# Patient Record
Sex: Male | Born: 1975 | Race: White | Hispanic: No | Smoking: Never smoker
Health system: Southern US, Community
[De-identification: ages and names within clinical notes are randomized; demographics above are authoritative.]

## PROBLEM LIST (undated history)

## (undated) DIAGNOSIS — E039 Hypothyroidism, unspecified: Secondary | ICD-10-CM

## (undated) DIAGNOSIS — J45909 Unspecified asthma, uncomplicated: Secondary | ICD-10-CM

## (undated) DIAGNOSIS — K7010 Alcoholic hepatitis without ascites: Secondary | ICD-10-CM

## (undated) HISTORY — PX: NO PAST SURGERIES: SHX2092

## (undated) HISTORY — DX: Hypothyroidism, unspecified: E03.9

## (undated) HISTORY — DX: Alcoholic hepatitis without ascites: K70.10

## (undated) HISTORY — DX: Unspecified asthma, uncomplicated: J45.909

---

## 2005-03-20 ENCOUNTER — Emergency Department (HOSPITAL_COMMUNITY): Admission: EM | Admit: 2005-03-20 | Discharge: 2005-03-20 | Payer: Self-pay | Admitting: Emergency Medicine

## 2008-06-17 ENCOUNTER — Encounter: Admission: RE | Admit: 2008-06-17 | Discharge: 2008-06-17 | Payer: Self-pay | Admitting: Internal Medicine

## 2008-06-27 ENCOUNTER — Encounter: Admission: RE | Admit: 2008-06-27 | Discharge: 2008-06-27 | Payer: Self-pay | Admitting: Orthopaedic Surgery

## 2008-07-09 ENCOUNTER — Encounter: Admission: RE | Admit: 2008-07-09 | Discharge: 2008-07-09 | Payer: Self-pay | Admitting: Orthopaedic Surgery

## 2010-02-01 ENCOUNTER — Encounter: Payer: Self-pay | Admitting: Orthopaedic Surgery

## 2016-06-09 ENCOUNTER — Encounter: Payer: Self-pay | Admitting: Physician Assistant

## 2016-06-09 ENCOUNTER — Ambulatory Visit (INDEPENDENT_AMBULATORY_CARE_PROVIDER_SITE_OTHER): Payer: Managed Care, Other (non HMO) | Admitting: Physician Assistant

## 2016-06-09 ENCOUNTER — Ambulatory Visit (INDEPENDENT_AMBULATORY_CARE_PROVIDER_SITE_OTHER): Payer: Managed Care, Other (non HMO)

## 2016-06-09 VITALS — BP 142/90 | HR 92 | Temp 98.7°F | Resp 18 | Wt 188.4 lb

## 2016-06-09 DIAGNOSIS — M25571 Pain in right ankle and joints of right foot: Secondary | ICD-10-CM

## 2016-06-09 MED ORDER — NAPROXEN 500 MG PO TABS: 500.0000 mg | ORAL_TABLET | Freq: Two times a day (BID) | ORAL | 0 refills | Status: DC

## 2016-06-09 NOTE — Patient Instructions (Addendum)
Your xray today does not show a fracture, which is great. This is likely a sprain. I would like you to wear ankle brace daily for the next 3-5 days. You should also rest, ice (4-5 x a day for 20 minutes at a time), and elevate your ankle while at home. I recommend taking naproxen twice daily at least for the next 3-5 days or until swelling and pain resolve. If your pain and swelling are still present in 7-10 days, please return. If symptoms worsen, seek care sooner. Thank you for letting me participate in your health and well being.  Ankle Sprain, Phase I Rehab Ask your health care provider which exercises are safe for you. Do exercises exactly as told by your health care provider and adjust them as directed. It is normal to feel mild stretching, pulling, tightness, or discomfort as you do these exercises, but you should stop right away if you feel sudden pain or your pain gets worse.Do not begin these exercises until told by your health care provider. Stretching and range of motion exercises These exercises warm up your muscles and joints and improve the movement and flexibility of your lower leg and ankle. These exercises also help to relieve pain and stiffness. Exercise A: Gastroc and soleus stretch   1. Sit on the floor with your left / right leg extended. 2. Loop a belt or towel around the ball of your left / right foot. The ball of your foot is on the walking surface, right under your toes. 3. Keep your left / right ankle and foot relaxed and keep your knee straight while you use the belt or towel to pull your foot toward you. You should feel a gentle stretch behind your calf or knee. 4. Hold this position for __________ seconds, then release to the starting position. Repeat the exercise with your knee bent. You can put a pillow or a rolled bath towel under your knee to support it. You should feel a stretch deep in your calf or at your Achilles tendon. Repeat each stretch __________ times.  Complete these stretches __________ times a day. Exercise B: Ankle alphabet   1. Sit with your left / right leg supported at the lower leg.  Do not rest your foot on anything.  Make sure your foot has room to move freely. 2. Think of your left / right foot as a paintbrush, and move your foot to trace each letter of the alphabet in the air. Keep your hip and knee still while you trace. Make the letters as large as you can without feeling discomfort. 3. Trace every letter from A to Z. Repeat __________ times. Complete this exercise __________ times a day. Strengthening exercises These exercises build strength and endurance in your ankle and lower leg. Endurance is the ability to use your muscles for a long time, even after they get tired. Exercise C: Dorsiflexors   1. Secure a rubber exercise band or tube to an object, such as a table leg, that will stay still when the band is pulled. Secure the other end around your left / right foot. 2. Sit on the floor facing the object, with your left / right leg extended. The band or tube should be slightly tense when your foot is relaxed. 3. Slowly bring your foot toward you, pulling the band tighter. 4. Hold this position for __________ seconds. 5. Slowly return your foot to the starting position. Repeat __________ times. Complete this exercise __________ times a day. Exercise  D: Plantar flexors   1. Sit on the floor with your left / right leg extended. 2. Loop a rubber exercise tube or band around the ball of your left / right foot. The ball of your foot is on the walking surface, right under your toes.  Hold the ends of the band or tube in your hands.  The band or tube should be slightly tense when your foot is relaxed. 3. Slowly point your foot and toes downward, pushing them away from you. 4. Hold this position for __________ seconds. 5. Slowly return your foot to the starting position. Repeat __________ times. Complete this exercise  __________ times a day. Exercise E: Evertors  1. Sit on the floor with your legs straight out in front of you. 2. Loop a rubber exercise band or tube around the ball of your left / right foot. The ball of your foot is on the walking surface, right under your toes.  Hold the ends of the band in your hands, or secure the band to a stable object.  The band or tube should be slightly tense when your foot is relaxed. 3. Slowly push your foot outward, away from your other leg. 4. Hold this position for __________ seconds. 5. Slowly return your foot to the starting position. Repeat __________ times. Complete this exercise __________ times a day. This information is not intended to replace advice given to you by your health care provider. Make sure you discuss any questions you have with your health care provider. Document Released: 07/29/2004 Document Revised: 09/04/2015 Document Reviewed: 11/11/2014 Elsevier Interactive Patient Education  2017 [employer]    IF you received an x-ray today, you will receive an invoice from [facility]. Please contact [facility] at [phone] with questions or concerns regarding your invoice.   IF you received labwork today, you will receive an invoice from [facility]. Please contact [facility] at [phone] with questions or concerns regarding your invoice.   Our billing staff will not be able to assist you with questions regarding bills from these companies.  You will be contacted with the lab results as soon as they are available. The fastest way to get your results is to activate your My Chart account. Instructions are located on the last page of this paperwork. If you have not heard from us regarding the results in 2 weeks, please contact this office.

## 2016-06-09 NOTE — Progress Notes (Signed)
Danny West [place]rington  MRN: [id number] DOB: 12/19/1975  Subjective:  Danny West Mulcahey is a 41 y.o. male seen in office today for a chief complaint of right ankle pain x 3 days. He kicked wood with his right foot 3 days ago and noticed immediate pain. Has associated swelling and limited ROM. Endorses intermittent numbness. He can bear weight but it hurts to do so. Denies tingling and bruising. Has tried elevation, ice, and ibuprofen with mild relief. Has hx of multiple sprains in bilateral ankles.  Review of Systems  Constitutional: Negative for chills, diaphoresis and fever.  Gastrointestinal: Negative for nausea and vomiting.    There are no active problems to display for this patient.   No current outpatient prescriptions on file prior to visit.   No current facility-administered medications on file prior to visit.     No Known Allergies     Social History   Social History  . Marital status: Married    Spouse name: N/A  . Number of children: N/A  . Years of education: N/A   Occupational History  . Not on file.   Social History Main Topics  . Smoking status: Never Smoker  . Smokeless tobacco: Never Used  . Alcohol use Yes  . Drug use: Unknown  . Sexual activity: Not on file   Other Topics Concern  . Not on file   Social History Narrative  . No narrative on file    Objective:  BP (!) 142/90   Pulse 92   Temp 98.7 F (37.1 C) (Oral)   Resp 18   Wt 188 lb 6.4 oz (85.5 kg)   SpO2 98%   Physical Exam  Constitutional: He is oriented to person, place, and time and well-developed, well-nourished, and in no distress.  HENT:  Head: Normocephalic and atraumatic.  Eyes: Conjunctivae are normal.  Neck: Normal range of motion.  Pulmonary/Chest: Effort normal.  Musculoskeletal:       Right ankle: He exhibits decreased range of motion and swelling ( over medial malleolus and medial ligament). He exhibits no ecchymosis and normal pulse. Tenderness. Medial malleolus tenderness  found. No lateral malleolus, no AITFL, no head of 5th metatarsal and no proximal fibula tenderness found. Achilles tendon normal.  Cap refill <2 sec in right foot.   Neurological: He is alert and oriented to person, place, and time. Gait normal.  Skin: Skin is warm and dry.  Psychiatric: Affect normal.  Vitals reviewed.  No results found for this or any previous visit (from the past 24 hour(s)).  Dg Ankle Complete Right  Result Date: 06/09/2016 CLINICAL DATA:  Right ankle pain and swelling for 3 days medially after kicking a piece of wood. Initial encounter. EXAM: RIGHT ANKLE - COMPLETE 3+ VIEW COMPARISON:  None. FINDINGS: No acute fracture or dislocation is identified. There is an ankle joint effusion. There is spurring at the tip of the medial malleolus and adjacent talus. Bone mineralization appears normal. IMPRESSION: 1. Ankle joint effusion without acute osseous abnormality identified. 2. Medial tibiotalar spurring. Electronically Signed   By: Sebastian AcheAllen  Grady M.D.   On: 06/09/2016 09:58     Assessment and Plan :  1. Acute right ankle pain Plain films do not suggest acute fx or dislocation. Likely ankle sprain. Will treat with P.R.I.C.E principles. Recommended to return to clinic if symptoms worsen, do not improve in 7-10 days, or as needed - DG Ankle Complete Right; Future - Apply ASO ankle - naproxen (NAPROSYN) 500 MG tablet; Take 1 tablet (  500 mg total) by mouth 2 (two) times daily with a meal.  Dispense: 30 tablet; Refill: 0   Benjiman Core, PA-C  [facility] 06/09/2016 1:54 PM

## 2017-11-22 DIAGNOSIS — F1010 Alcohol abuse, uncomplicated: Secondary | ICD-10-CM | POA: Insufficient documentation

## 2017-11-22 DIAGNOSIS — F3341 Major depressive disorder, recurrent, in partial remission: Secondary | ICD-10-CM | POA: Insufficient documentation

## 2017-11-22 DIAGNOSIS — F1091 Alcohol use, unspecified, in remission: Secondary | ICD-10-CM | POA: Insufficient documentation

## 2018-12-14 IMAGING — DX DG ANKLE COMPLETE 3+V*R*
4 series · 4 of 4 positions shown · non-contrast
Comparison: None.

CLINICAL DATA: Right ankle pain and swelling for 3 days medially
after kicking a piece of Juela. Initial encounter.

EXAM:
RIGHT ANKLE - COMPLETE 3+ VIEW

[ankle ap]
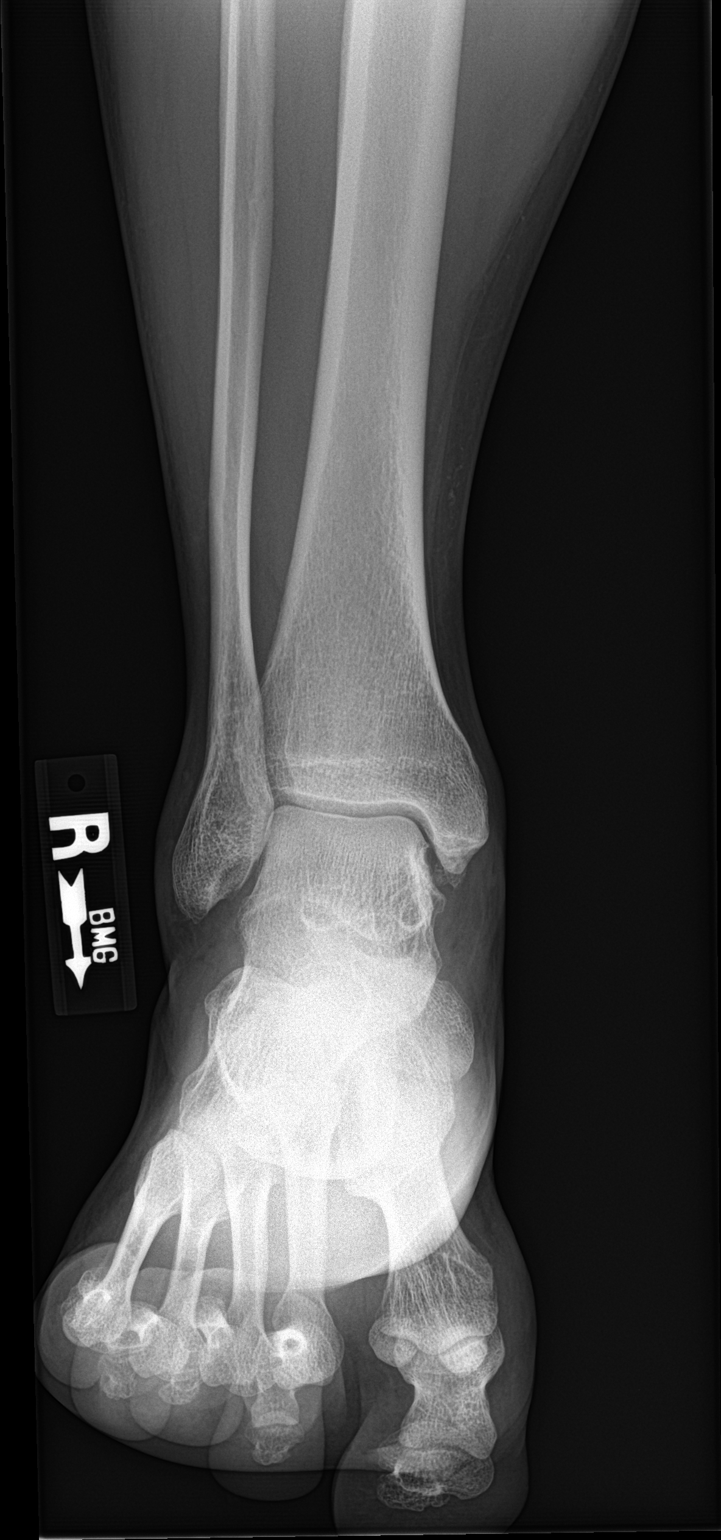

[ankle obl (1 of 2)]
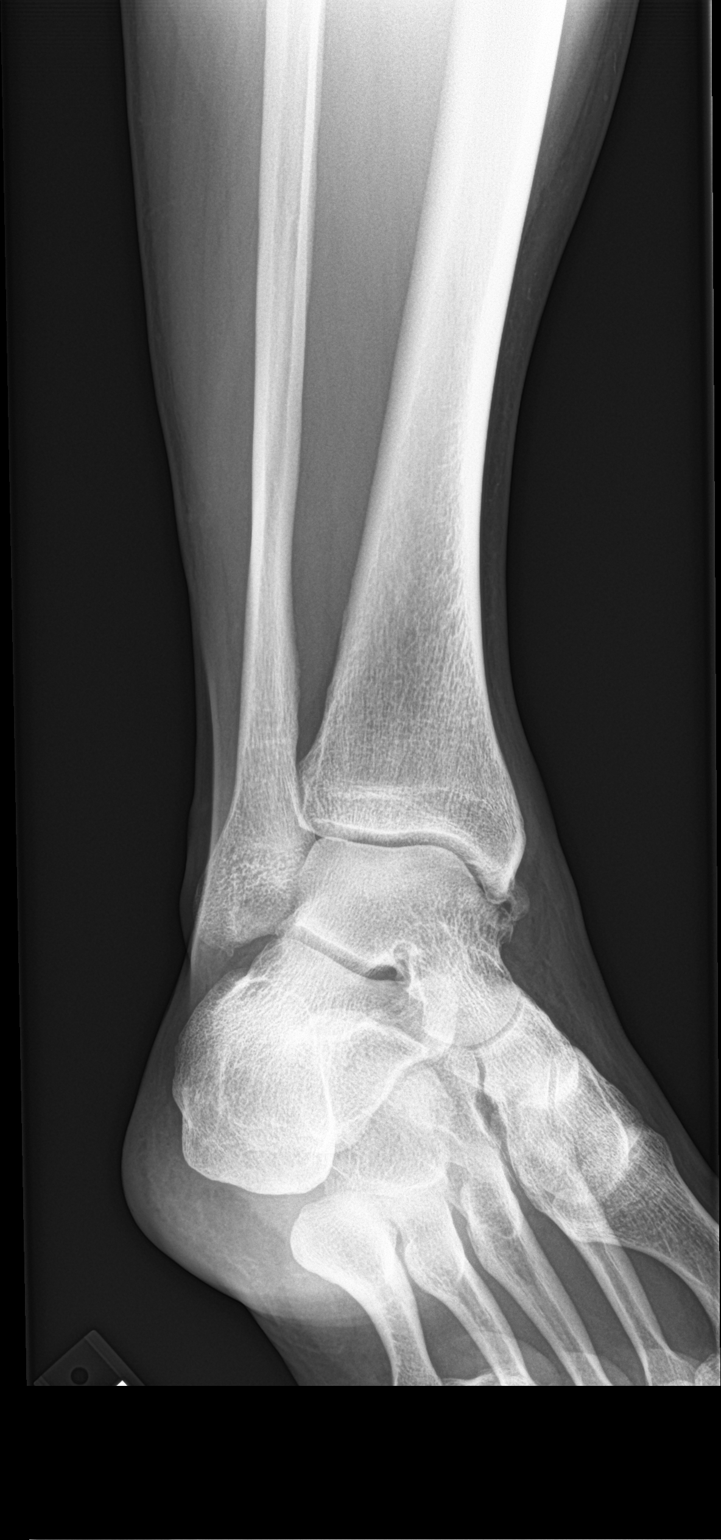

[ankle lat]
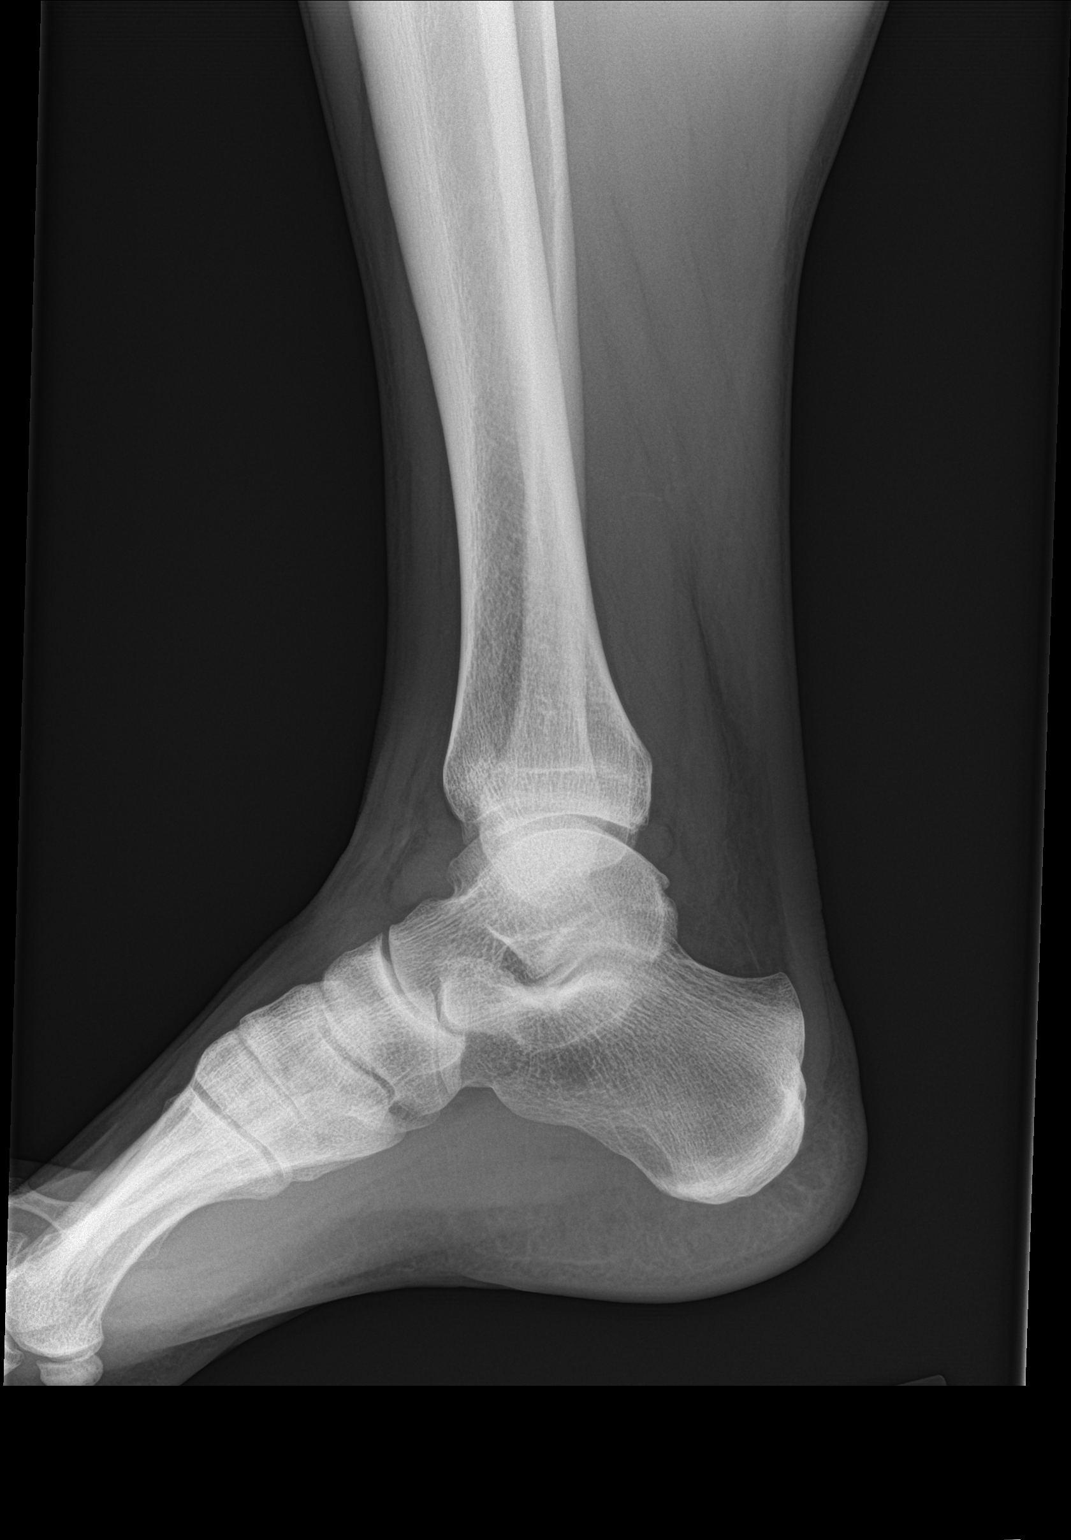

[ankle obl (2 of 2)]
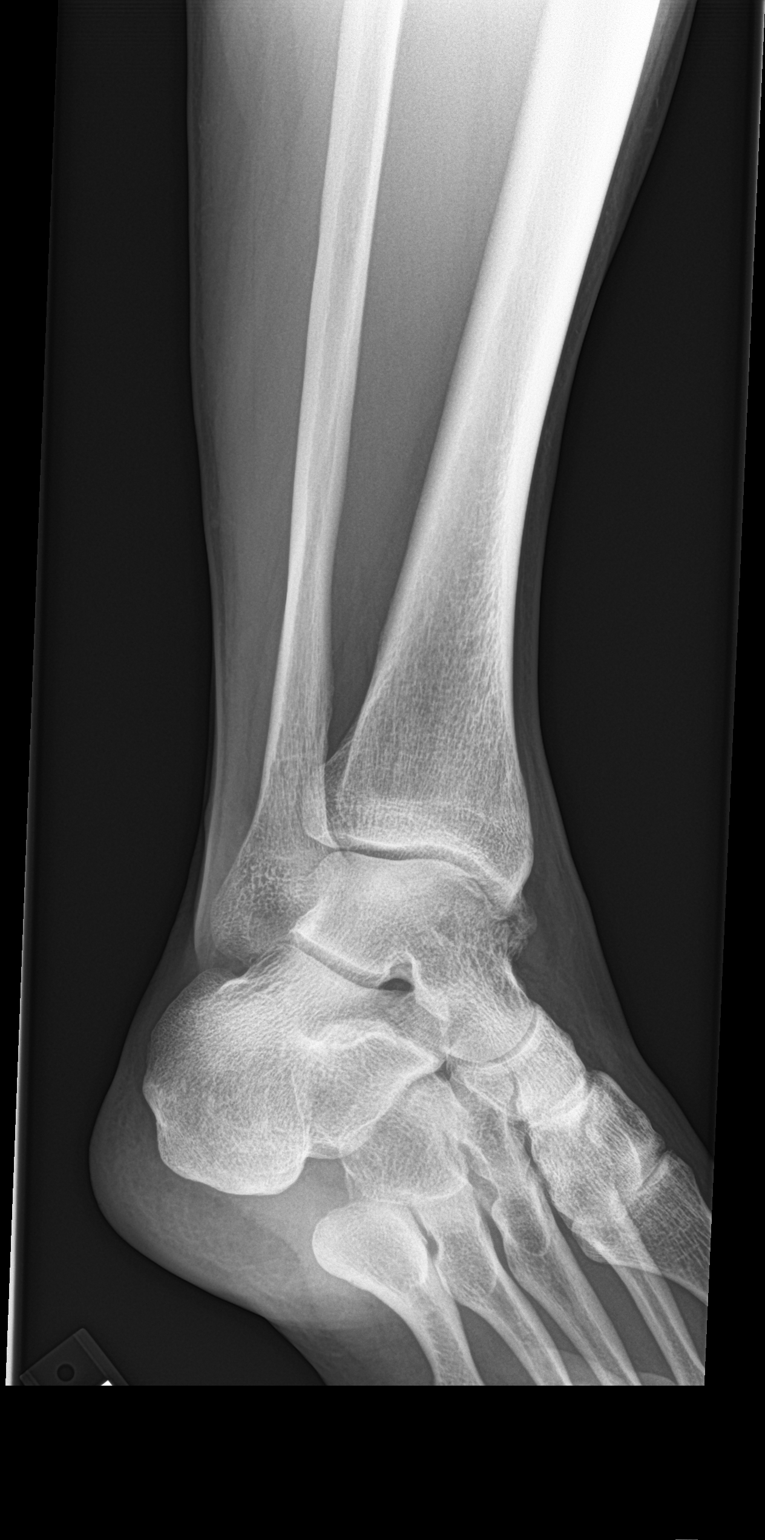

[4 of 4 positions shown; findings below may reference images not displayed]

FINDINGS: No acute fracture or dislocation is identified. There is an ankle
joint effusion. There is spurring at the tip of the medial malleolus
and adjacent talus. Bone mineralization appears normal.
IMPRESSION: 1. Ankle joint effusion without acute osseous abnormality
identified.
2. Medial tibiotalar spurring.

## 2020-10-02 ENCOUNTER — Emergency Department (HOSPITAL_BASED_OUTPATIENT_CLINIC_OR_DEPARTMENT_OTHER): Payer: Self-pay

## 2020-10-02 ENCOUNTER — Emergency Department (HOSPITAL_BASED_OUTPATIENT_CLINIC_OR_DEPARTMENT_OTHER)
Admission: EM | Admit: 2020-10-02 | Discharge: 2020-10-02 | Disposition: A | Discharge status: Home / Self Care | Payer: Self-pay | Attending: Emergency Medicine | Admitting: Emergency Medicine

## 2020-10-02 ENCOUNTER — Other Ambulatory Visit: Payer: Self-pay

## 2020-10-02 ENCOUNTER — Encounter (HOSPITAL_BASED_OUTPATIENT_CLINIC_OR_DEPARTMENT_OTHER): Payer: Self-pay

## 2020-10-02 DIAGNOSIS — J45909 Unspecified asthma, uncomplicated: Secondary | ICD-10-CM | POA: Insufficient documentation

## 2020-10-02 DIAGNOSIS — G4700 Insomnia, unspecified: Secondary | ICD-10-CM | POA: Insufficient documentation

## 2020-10-02 DIAGNOSIS — R42 Dizziness and giddiness: Secondary | ICD-10-CM | POA: Insufficient documentation

## 2020-10-02 DIAGNOSIS — F419 Anxiety disorder, unspecified: Secondary | ICD-10-CM | POA: Insufficient documentation

## 2020-10-02 LAB — CBG MONITORING, ED: Glucose-Capillary: 113 mg/dL — ABNORMAL HIGH (ref 70–99)

## 2020-10-02 LAB — CBC WITH DIFFERENTIAL/PLATELET
Abs Immature Granulocytes: 0.01 10*3/uL (ref 0.00–0.07)
Basophils Absolute: 0.1 10*3/uL (ref 0.0–0.1)
Basophils Relative: 1 %
Eosinophils Absolute: 0 10*3/uL (ref 0.0–0.5)
Eosinophils Relative: 1 %
HCT: 40.7 % (ref 39.0–52.0)
Hemoglobin: 14.6 g/dL (ref 13.0–17.0)
Immature Granulocytes: 0 %
Lymphocytes Relative: 26 %
Lymphs Abs: 1.9 10*3/uL (ref 0.7–4.0)
MCH: 34 pg (ref 26.0–34.0)
MCHC: 35.9 g/dL (ref 30.0–36.0)
MCV: 94.9 fL (ref 80.0–100.0)
Monocytes Absolute: 0.7 10*3/uL (ref 0.1–1.0)
Monocytes Relative: 9 %
Neutro Abs: 4.7 10*3/uL (ref 1.7–7.7)
Neutrophils Relative %: 63 %
Platelets: 125 10*3/uL — ABNORMAL LOW (ref 150–400)
RBC: 4.29 MIL/uL (ref 4.22–5.81)
RDW: 12 % (ref 11.5–15.5)
WBC: 7.5 10*3/uL (ref 4.0–10.5)
nRBC: 0 % (ref 0.0–0.2)

## 2020-10-02 LAB — BASIC METABOLIC PANEL
Anion gap: 12 (ref 5–15)
BUN: 10 mg/dL (ref 6–20)
CO2: 26 mmol/L (ref 22–32)
Calcium: 9.5 mg/dL (ref 8.9–10.3)
Chloride: 98 mmol/L (ref 98–111)
Creatinine, Ser: 0.92 mg/dL (ref 0.61–1.24)
GFR, Estimated: >60 mL/min (ref >60)
Glucose, Bld: 104 mg/dL — ABNORMAL HIGH (ref 70–99)
Potassium: 3.8 mmol/L (ref 3.5–5.1)
Sodium: 136 mmol/L (ref 135–145)

## 2020-10-02 LAB — TROPONIN I (HIGH SENSITIVITY): Troponin I (High Sensitivity): 3 ng/L (ref <18)

## 2020-10-02 MED ORDER — SODIUM CHLORIDE 0.9 % IV BOLUS
1000.0000 mL | Freq: Once | INTRAVENOUS | Status: AC
  Administered 2020-10-02: 1000 mL via INTRAVENOUS

## 2020-10-02 NOTE — ED Provider Notes (Signed)
[facility] EMERGENCY DEPT Provider Note   CSN: [id number] Arrival date & time: 10/02/20  0147     History Chief Complaint  Patient presents with   Dizziness   Anxiety    Danny West is a 45 y.o. male.  The history is provided by the patient.  Dizziness Quality:  Lightheadedness Severity:  Moderate Onset quality:  Gradual Duration: weeks. Timing:  Constant Progression:  Unchanged Chronicity:  New Context: not when standing up and not when urinating   Context comment:  Stress and anxiety related to custody issues. Relieved by:  Nothing Worsened by:  Nothing Ineffective treatments:  None tried Associated symptoms: no blood in stool, no chest pain, no diarrhea, no headaches, no hearing loss, no nausea, no shortness of breath, no syncope, no tinnitus, no vision changes and no vomiting   Risk factors: no hx of vertigo   Anxiety Pertinent negatives include no chest pain, no headaches and no shortness of breath.  Patient presents with anxiety, stress and insomnia related to stress.  States he is seeing a therapist but needs someone to prescribe medications for him. No CP, No SOB.  No vomiting.  No f/c/r.  No SI no HI.  His appointment with the counselor is today.     Past Medical History:  Diagnosis Date   Asthma     There are no problems to display for this patient.   History reviewed. No pertinent surgical history.     History reviewed. No pertinent family history.  Social History   Tobacco Use   Smoking status: Never   Smokeless tobacco: Never  Substance Use Topics   Alcohol use: Yes    Comment: Occasionally   Drug use: Never    Home Medications Prior to Admission medications   Medication Sig Start Date End Date Taking? Authorizing Provider  FLUoxetine (PROZAC) 40 MG capsule Take 40 mg by mouth daily.    [provider]  levothyroxine (SYNTHROID, LEVOTHROID) 175 MCG tablet Take 175 mcg by mouth daily before breakfast.    [provider]  lisdexamfetamine (VYVANSE) 40 MG capsule Take 40 mg by mouth every morning.    [provider]  naproxen (NAPROSYN) 500 MG tablet Take 1 tablet (500 mg total) by mouth 2 (two) times daily with a meal. 06/09/16   Benjiman Core D, PA-C    Allergies    Patient has no known allergies.  Review of Systems   Review of Systems  Constitutional:  Negative for fever.  HENT:  Negative for hearing loss and tinnitus.   Respiratory:  Negative for shortness of breath, wheezing and stridor.   Cardiovascular:  Negative for chest pain and syncope.  Gastrointestinal:  Negative for blood in stool, diarrhea, nausea and vomiting.  Genitourinary:  Negative for difficulty urinating.  Skin:  Negative for rash.  Neurological:  Positive for light-headedness. Negative for headaches.  Psychiatric/Behavioral:  Positive for sleep disturbance. Negative for agitation, confusion and suicidal ideas. The patient is not hyperactive.   All other systems reviewed and are negative.  Physical Exam Updated Vital Signs BP (!) 131/96   Pulse 80   Temp 98 F (36.7 C) (Oral)   Resp 16   Ht 5\' 7"  (1.702 m)   Wt 85.5 kg   SpO2 98%   BMI 29.52 kg/m   Physical Exam Vitals and nursing note reviewed.  Constitutional:      General: He is not in acute distress.    Appearance: Normal appearance.  HENT:  Head: Normocephalic and atraumatic.     Nose: Nose normal.  Eyes:     Conjunctiva/sclera: Conjunctivae normal.     Pupils: Pupils are equal, round, and reactive to light.  Cardiovascular:     Rate and Rhythm: Normal rate and regular rhythm.     Pulses: Normal pulses.     Heart sounds: Normal heart sounds.  Pulmonary:     Effort: Pulmonary effort is normal.     Breath sounds: Normal breath sounds.  Abdominal:     General: Abdomen is flat. Bowel sounds are normal.     Palpations: Abdomen is soft.     Tenderness: There is no abdominal tenderness. There is no guarding or rebound.   Musculoskeletal:        General: Normal range of motion.     Cervical back: Normal range of motion and neck supple.  Skin:    General: Skin is warm and dry.     Capillary Refill: Capillary refill takes less than 2 seconds.  Neurological:     General: No focal deficit present.     Mental Status: He is alert and oriented to person, place, and time.     Deep Tendon Reflexes: Reflexes normal.  Psychiatric:        Mood and Affect: Mood normal.        Behavior: Behavior normal.    ED Results / Procedures / Treatments   Labs (all labs ordered are listed, but only abnormal results are displayed) Labs Reviewed  CBC WITH DIFFERENTIAL/PLATELET - Abnormal; Notable for the following components:      Result Value   Platelets 125 (*)    All other components within normal limits  BASIC METABOLIC PANEL - Abnormal; Notable for the following components:   Glucose, Bld 104 (*)    All other components within normal limits  CBG MONITORING, ED - Abnormal; Notable for the following components:   Glucose-Capillary 113 (*)    All other components within normal limits  TROPONIN I (HIGH SENSITIVITY)  TROPONIN I (HIGH SENSITIVITY)    EKG None  Radiology DG Chest Portable 1 View  Result Date: 10/02/2020 CLINICAL DATA:  Chest pain. EXAM: PORTABLE CHEST 1 VIEW COMPARISON:  None. FINDINGS: The heart size and mediastinal contours are within normal limits. Both lungs are clear. The visualized skeletal structures are unremarkable. IMPRESSION: No active disease. Electronically Signed   By: Elgie Collard M.D.   On: 10/02/2020 02:38    Procedures Procedures   Medications Ordered in ED Medications  sodium chloride 0.9 % bolus 1,000 mL (1,000 mLs Intravenous New Bag/Given 10/02/20 0327)    ED Course  I have reviewed the triage vital signs and the nursing notes.  Pertinent labs & imaging results that were available during my care of the patient were reviewed by me and considered in my medical decision  making (see chart for details).   Given he stated he has not been hydrating well IVF was ordered. We do not start sleeping medications or SSRIs in the ED.  Follow up with your regular doctor.  Recommend melatonin or unisom for sleep.    Danny West was evaluated in Emergency Department on 10/02/2020 for the symptoms described in the history of present illness. He was evaluated in the context of the global COVID-19 pandemic, which necessitated consideration that the patient might be at risk for infection with the SARS-CoV-2 virus that causes COVID-19. Institutional protocols and algorithms that pertain to the evaluation of patients at risk for COVID-19  are in a state of rapid change based on information released by regulatory bodies including the CDC and federal and state organizations. These policies and algorithms were followed during the patient's care in the ED.]  Final Clinical Impression(s) / ED Diagnoses Final diagnoses:  Anxiety  Insomnia, unspecified type   Return for intractable cough, coughing up blood, fevers > 100.4 unrelieved by medication, shortness of breath, intractable vomiting, chest pain, shortness of breath, weakness, numbness, changes in speech, facial asymmetry, abdominal pain, passing out, Inability to tolerate liquids or food, cough, altered mental status or any concerns. No signs of systemic illness or infection. The patient is nontoxic-appearing on exam and vital signs are within normal limits. I have reviewed the triage vital signs and the nursing notes. Pertinent labs & imaging results that were available during my care of the patient were reviewed by me and considered in my medical decision making (see chart for details). After history, exam, and medical workup I feel the patient has been appropriately medically screened and is safe for discharge home. Pertinent diagnoses were discussed with the patient. Patient was given return precautions.   Rx / DC Orders ED Discharge  Orders     None        Ilian Wessell, MD 10/02/20 [phone]

## 2020-10-02 NOTE — ED Triage Notes (Signed)
Patient here POV from Home with Anxiety and Dizziness.  Patient states he has been under Stress recently due to Personal Issues. Patient has had Difficulty Sleeping, Dizziness, Anxiety, and Loss of Appetite due to Anxiety. Patient has been seeing Therapist recently but does not take medications for Same.   Patient Anxious during Triage. A&Ox4. GCS 15. No SI.

## 2020-10-02 NOTE — Discharge Instructions (Addendum)
Can take melatonin 1 tablet nightly for sleep or one unisom prior to bed

## 2021-01-19 ENCOUNTER — Encounter (HOSPITAL_COMMUNITY): Payer: Self-pay | Admitting: Student

## 2021-01-19 ENCOUNTER — Ambulatory Visit (HOSPITAL_COMMUNITY)
Admission: EM | Admit: 2021-01-19 | Discharge: 2021-01-20 | Disposition: A | Discharge status: Home / Self Care | Payer: No Payment, Other | Attending: Student | Admitting: Student

## 2021-01-19 DIAGNOSIS — F1020 Alcohol dependence, uncomplicated: Secondary | ICD-10-CM | POA: Insufficient documentation

## 2021-01-19 DIAGNOSIS — Z20822 Contact with and (suspected) exposure to covid-19: Secondary | ICD-10-CM | POA: Insufficient documentation

## 2021-01-19 DIAGNOSIS — R21 Rash and other nonspecific skin eruption: Secondary | ICD-10-CM | POA: Insufficient documentation

## 2021-01-19 DIAGNOSIS — R251 Tremor, unspecified: Secondary | ICD-10-CM | POA: Insufficient documentation

## 2021-01-19 DIAGNOSIS — R45851 Suicidal ideations: Secondary | ICD-10-CM | POA: Insufficient documentation

## 2021-01-19 DIAGNOSIS — T43226A Underdosing of selective serotonin reuptake inhibitors, initial encounter: Secondary | ICD-10-CM | POA: Insufficient documentation

## 2021-01-19 DIAGNOSIS — T381X6A Underdosing of thyroid hormones and substitutes, initial encounter: Secondary | ICD-10-CM | POA: Insufficient documentation

## 2021-01-19 DIAGNOSIS — F332 Major depressive disorder, recurrent severe without psychotic features: Secondary | ICD-10-CM | POA: Insufficient documentation

## 2021-01-19 DIAGNOSIS — R112 Nausea with vomiting, unspecified: Secondary | ICD-10-CM | POA: Insufficient documentation

## 2021-01-19 DIAGNOSIS — Z6379 Other stressful life events affecting family and household: Secondary | ICD-10-CM | POA: Insufficient documentation

## 2021-01-19 DIAGNOSIS — F1521 Other stimulant dependence, in remission: Secondary | ICD-10-CM | POA: Insufficient documentation

## 2021-01-19 DIAGNOSIS — R0789 Other chest pain: Secondary | ICD-10-CM | POA: Insufficient documentation

## 2021-01-19 DIAGNOSIS — J45909 Unspecified asthma, uncomplicated: Secondary | ICD-10-CM | POA: Insufficient documentation

## 2021-01-19 DIAGNOSIS — F419 Anxiety disorder, unspecified: Secondary | ICD-10-CM | POA: Insufficient documentation

## 2021-01-19 DIAGNOSIS — Z5989 Other problems related to housing and economic circumstances: Secondary | ICD-10-CM | POA: Insufficient documentation

## 2021-01-19 DIAGNOSIS — F1291 Cannabis use, unspecified, in remission: Secondary | ICD-10-CM | POA: Insufficient documentation

## 2021-01-19 DIAGNOSIS — G4700 Insomnia, unspecified: Secondary | ICD-10-CM | POA: Insufficient documentation

## 2021-01-19 DIAGNOSIS — E039 Hypothyroidism, unspecified: Secondary | ICD-10-CM | POA: Insufficient documentation

## 2021-01-19 DIAGNOSIS — Z720 Tobacco use: Secondary | ICD-10-CM | POA: Insufficient documentation

## 2021-01-19 DIAGNOSIS — Z635 Disruption of family by separation and divorce: Secondary | ICD-10-CM | POA: Insufficient documentation

## 2021-01-19 DIAGNOSIS — Z9152 Personal history of nonsuicidal self-harm: Secondary | ICD-10-CM | POA: Insufficient documentation

## 2021-01-19 DIAGNOSIS — Z91128 Patient's intentional underdosing of medication regimen for other reason: Secondary | ICD-10-CM | POA: Insufficient documentation

## 2021-01-19 LAB — POCT URINE DRUG SCREEN - MANUAL ENTRY (I-SCREEN)
POC Amphetamine UR: NOT DETECTED
POC Buprenorphine (BUP): NOT DETECTED
POC Cocaine UR: NOT DETECTED
POC Marijuana UR: NOT DETECTED
POC Methadone UR: NOT DETECTED
POC Methamphetamine UR: NOT DETECTED
POC Morphine: NOT DETECTED
POC Oxazepam (BZO): NOT DETECTED
POC Oxycodone UR: NOT DETECTED
POC Secobarbital (BAR): NOT DETECTED

## 2021-01-19 LAB — POC SARS CORONAVIRUS 2 AG -  ED: SARS Coronavirus 2 Ag: NEGATIVE

## 2021-01-19 MED ORDER — CEPHALEXIN 250 MG PO CAPS
500.0000 mg | ORAL_CAPSULE | Freq: Four times a day (QID) | ORAL | Status: DC
  Administered 2021-01-20: 500 mg via ORAL
  Filled 2021-01-19: qty 2

## 2021-01-19 MED ORDER — LOPERAMIDE HCL 2 MG PO CAPS: 2.0000 mg | ORAL_CAPSULE | ORAL | Status: DC | PRN

## 2021-01-19 MED ORDER — LEVOTHYROXINE SODIUM 75 MCG PO TABS: 175.0000 ug | ORAL_TABLET | Freq: Every day | ORAL | Status: DC

## 2021-01-19 MED ORDER — HYDROXYZINE HCL 25 MG PO TABS: 25.0000 mg | ORAL_TABLET | Freq: Four times a day (QID) | ORAL | Status: DC | PRN

## 2021-01-19 MED ORDER — ADULT MULTIVITAMIN W/MINERALS CH: 1.0000 | ORAL_TABLET | Freq: Every day | ORAL | Status: DC

## 2021-01-19 MED ORDER — LEVOTHYROXINE SODIUM 100 MCG PO TABS
175.0000 ug | ORAL_TABLET | Freq: Every day | ORAL | Status: DC
Start: 2021-01-20 — End: 2021-01-20

## 2021-01-19 MED ORDER — ACETAMINOPHEN 325 MG PO TABS: 650.0000 mg | ORAL_TABLET | Freq: Four times a day (QID) | ORAL | Status: DC | PRN

## 2021-01-19 MED ORDER — ALBUTEROL SULFATE HFA 108 (90 BASE) MCG/ACT IN AERS: 1.0000 | INHALATION_SPRAY | Freq: Four times a day (QID) | RESPIRATORY_TRACT | Status: DC | PRN

## 2021-01-19 MED ORDER — LORAZEPAM 1 MG PO TABS: 1.0000 mg | ORAL_TABLET | Freq: Four times a day (QID) | ORAL | Status: DC | PRN

## 2021-01-19 MED ORDER — TRAZODONE HCL 50 MG PO TABS: 50.0000 mg | ORAL_TABLET | Freq: Every evening | ORAL | Status: DC | PRN

## 2021-01-19 MED ORDER — SERTRALINE HCL 50 MG PO TABS
50.0000 mg | ORAL_TABLET | Freq: Every day | ORAL | Status: DC
Start: 2021-01-19 — End: 2021-01-20
  Administered 2021-01-19: 50 mg via ORAL
  Filled 2021-01-19: qty 1

## 2021-01-19 MED ORDER — ONDANSETRON 4 MG PO TBDP: 4.0000 mg | ORAL_TABLET | Freq: Four times a day (QID) | ORAL | Status: DC | PRN

## 2021-01-19 MED ORDER — CEPHALEXIN 250 MG PO CAPS: 500.0000 mg | ORAL_CAPSULE | Freq: Four times a day (QID) | ORAL | Status: DC

## 2021-01-19 MED ORDER — MAGNESIUM HYDROXIDE 400 MG/5ML PO SUSP: 30.0000 mL | Freq: Every day | ORAL | Status: DC | PRN

## 2021-01-19 MED ORDER — ALUM & MAG HYDROXIDE-SIMETH 200-200-20 MG/5ML PO SUSP: 30.0000 mL | ORAL | Status: DC | PRN

## 2021-01-19 MED ORDER — THIAMINE HCL 100 MG PO TABS: 100.0000 mg | ORAL_TABLET | Freq: Every day | ORAL | Status: DC

## 2021-01-19 NOTE — ED Provider Notes (Addendum)
Behavioral Health Admission [facility] & OBS)  Date: 01/19/21 Patient Name: Danny West MRN: [id number] Chief Complaint:  Chief Complaint  Patient presents with   Suicidal      Diagnoses:  Final diagnoses:  MDD (major depressive disorder), recurrent severe, without psychosis ([place])  Alcohol use disorder, severe, dependence ([place])  Suicidal ideation    HPI: Danny West is a 46 year old male with past psychiatric history significant for depression, anxiety, insomnia, and alcohol abuse, as well as past medical history significant for asthma and hypothyroidism, who presents to the [facility] behavioral health urgent care [facility]) unaccompanied as a voluntary walk-in for symptoms of worsening depression, worsening anxiety, suicidal ideation with plan, and alcohol abuse.  Patient reports that he believes that he is currently experiencing "another mental breakdown".  Patient states "I'm just tired of fighting and the money and the divorce and the fight with my kids".  Patient endorses active SI on exam with intent and plan to "jump in front of a similar train".  Patient states that the main stressors in his life that are currently contributing to his worsening depression, anxiety, and SI are issues with his ex-wife, not being able to see his children as often as he would like to, and financial stressors.  Patient states that he has been separated from his ex-wife since 09/28/2017, in which patient reports that his ex-wife took his children from him at that time.  Patient states that he is currently only allowed custody of his children for 1 day / 24 hours every other week, which patient reports is unsupervised custody.  Patient reports that he has been hoping to obtain 50-50 shared custody of his children, patient states that he is fearful that he will not be able to obtain this due to his current mental health issues.  Patient also reports that he took his children with him to his sister's home in  [place] for the weekend this past weekend without telling his ex-wife because he did not know when he was going to see his children again and he reports that his ex-wife is upset with him about this.  Patient denies history of any past suicide attempts.  He endorses history of self-injurious behavior via cutting, but he denies any cutting behavior since he was in high school.  He denies history of intentionally burning himself.  He denies homicidal ideations.  He denies AVH.  Patient reports that he has suspicion that the person living in the floor below him in his apartment has possibly installed a meth lab in their apartment unit, but he states that he is not sure if this is actually true.  Patient reports that he has this suspicion due to smells of specific fumes that he has noticed coming from the apartment unit below him.  Patient reports that he may be experiencing paranoia regarding this matter, but he states that he is not entirely sure if he is paranoid about this issue or not because some people have told him that they do notice the same smells he does coming from the apartment below him and some people do not.  He reports his sleep is poor.  He states that he sleeps about 6 hours total per night but he states that he only sleeps a few hours at a time and has issues with falling asleep initially at night and also staying asleep throughout the night.  He denies history of recent nightmares.  He endorses strong feelings of anhedonia and states that he  does not enjoy doing any hobbies anymore due to his symptoms of depression.  He endorses feelings of guilt, hopelessness, and worthlessness over the past few months.  Patient also endorses declines in his energy, concentration, and appetite over the past few months.  Patient also reports that he has lost some weight over the past few months, but he states that he is unsure as to how much weight he has specifically lost.  Patient reports that in September  2019 when his ex-wife first took his children away from him, he became very upset and depressed and reacted to this by "spraying a solvent" all over his home and "acting like I was going to burn the house down", although patient states that he was not actually going to burn the house down at that time.  Patient states that he called the police and informed them of his plans to burn his home down at that time, which led to the patient being admitted to a psychiatric/medical unit at the [facility] for 2 weeks.  Patient reports that he was released from the jail after 2 weeks at that time in September 2019 and he denies any other past legal issues.  Patient reports that he is a documented felon due to these issues noted above from 2019.  Patient denies history of any additional previous inpatient psychiatric hospitalizations.  Patient denies currently taking any psychotropic medications at this time.  Patient also denies having a psychiatrist at this time. He reports that he used to take Zoloft for about 18 months to 2 years in the past, which was prescribed by his PCP and he states that this medication was helpful for his depressive symptoms and anxiety.  He denies experiencing any adverse side effects while he was taking this medication.  He reports that he stopped taking this medication about 1 year ago due to him running out of his prescription and losing health insurance at that time.  Per chart review, there is a previous prescription for Zoloft 100 mg p.o. daily in patient's chart from 12/11/2019.  Patient also reports that he took Prozac in the past as well prior to taking Zoloft.  Per chart review, patient has previous prescription in his chart for Prozac 40 mg p.o. daily. Patient reports that he [facility] [place]  He reports that he saw his therapist last week and prior to that he had not seen his therapist for about 2 months.  He reports that he  believes he does not get to see his therapist as often as he would like to.  Patient states that his therapist communicated with his parole officer last Thursday and that his therapist and parole officer were concerned about the patient's mental health at that time.  Patient endorses history of alcohol abuse.  He reports that he currently drinks 2 to 4 16 ounce beers per day and reports he has been drinking alcohol at this rate for the past 2 years.  He reports that prior to 2 years ago, he was drinking alcohol at a lower rate.  He reports that he drinks alcohol in order to help with his insomnia.  He reports that his last alcohol consumption was a few hours ago, in which she states that he drank 1 16 ounce beer at that time.  He reports that in addition to this, he also drank a few additional 16 ounce beers prior within the past 24 hours.  He denies  history of any alcohol related withdrawal symptoms or seizures.  Patient endorses using smokeless tobacco nearly all day every day.  He endorses history of past marijuana use, but he states that he has not used marijuana since February 2022. 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  Per PDMP review, no documented controlled substance prescriptions noted in [place].  However, per chart review, patient does appear to have previous prescription for Vyvanse 40 mg p.o. every morning in patient's EMR. He denies any illicit substance use at this time.  Patient reports that he received residential rehab treatment from October 2019 to November 2019 at [facility] in [place] for alcohol and Adderall/Vyvanse abuse/addiction.  Patient currently lives in [place] alone.  He denies access to firearms.  He reports that he is currently employed part-time at [employer] and has been employed there since May 2022.  He denies having any sources of social support at this time.   Patient is unable to contract for safety at this time.  Patient also endorses history of experiencing chronic daily intermittent nonradiating left-sided chest tightness since September 2022.  Patient reports that this chest tightness will wax and wane and he can go periods of time throughout the day without experiencing it at all and sometimes he will randomly experience that during the day.  He denies history of MI or any other cardiac conditions.  Patient does endorse history of chronic intermittent shortness of breath which he reports is secondary to his history of asthma.  Patient also endorses history of intermittent nausea and vomiting since September 2022.  He reports that the episodes of emesis have been occurring a few times per month.  He reports that his last episode of emesis was a few days ago.  He reports that his previous episodes of emesis have been clear in color.  He denies hematemesis. Patient does also report history of intermittent rash/redness and itching of his anterior left lower shin area that "comes and goes" a few times per month over the past 1 year.  He reports that this skin complaint he has not been evaluated by a provider before.  Patient also endorses chronic history of intermittent generalized tremor. He denies recent headache, lightheadedness, dizziness, vision changes, chest pain, nausea, abdominal pain, constipation, diarrhea, fever, diaphoresis, or additional pain. He denies headache, lightheadedness, dizziness, vision changes, chest pain, shortness of breath, nausea, abdominal pain, constipation, diarrhea, fever, chills, diaphoresis, any additional pain, or any additional physical symptoms on exam at this time. Patient does endorse intermittent feelings of feeling generally cold.    On exam, patient is sitting upright with disheveled appearance, in no acute distress.  Eye contact is good.  Speech is clear and coherent with normal rate and volume.  Mood is anxious and  depressed with congruent, tearful affect.  Thought process is coherent, goal directed, and linear.  Patient is alert and oriented x4, cooperative, and answers all questions appropriately during the evaluation.  No indication the patient is responding to internal/external stimuli on exam.  No delusional thought content noted on exam.  PHQ 2-9:   Flowsheet Row ED from 10/02/2020 in [facility]  [place] No Risk        Total Time spent with patient: 30 minutes  Musculoskeletal  Strength & Muscle Tone: within normal limits Gait & Station: normal Patient leans: N/A  Psychiatric Specialty Exam  Presentation  General Appearance: Appropriate for Environment; Disheveled  Eye Contact:Good  Speech:Clear and Coherent; Normal Rate  Speech Volume:Normal  Handedness:No data recorded  Mood and Affect  Mood:Anxious; Depressed  Affect:Congruent; Tearful   Thought Process  Thought Processes:Coherent; Goal Directed; Linear  Descriptions of Associations:Intact  Orientation:Full (Time, Place and Person)  Thought Content:Logical; WDL    Hallucinations:Hallucinations: None  Ideas of Reference:-- (Patient reports he thinks he may be experiencing some paranoia, but is not entirely sure if he is experiencing paranoia or not (see HPI for details).)  Suicidal Thoughts:Suicidal Thoughts: Yes, Active (See HPI for details.) SI Active Intent and/or Plan: With Intent; With Plan; With Means to [place]; With Access to Means  Homicidal Thoughts:Homicidal Thoughts: No   Sensorium  Memory:Immediate Good; Recent Good; Remote Good  Judgment:Fair  Insight:Fair   Executive Functions  Concentration:Fair  Attention Span:Fair  [place]  Language:Good   Psychomotor Activity  Psychomotor Activity:Psychomotor Activity: Normal   Assets  Assets:Communication Skills; Desire for Improvement; Housing; Leisure Time; Physical  Health; Resilience; Vocational/Educational   Sleep  Sleep:Sleep: Poor Number of Hours of Sleep: 6   Nutritional Assessment (For OBS and FBC admissions only) Has the patient had a weight loss or gain of 10 pounds or more in the last 3 months?: -- (Patient reports he has lost weight over the last few months, but is unsure of exact amount of weight loss.) Has the patient had a decrease in food intake/or appetite?: Yes Does the patient have dental problems?: No Does the patient have eating habits or behaviors that may be indicators of an eating disorder including binging or inducing vomiting?: No Has the patient recently lost weight without trying?: 2.0 Has the patient been eating poorly because of a decreased appetite?: 1 Malnutrition Screening Tool Score: 3 Nutritional Assessment Referrals: Refer to Primary Care Provider    Physical Exam Vitals reviewed.  Constitutional:      General: He is not in acute distress.    Appearance: He is not ill-appearing, toxic-appearing or diaphoretic.  HENT:     Head: Normocephalic and atraumatic.     Right Ear: External ear normal.     Left Ear: External ear normal.     Nose: Nose normal.  Eyes:     General: No scleral icterus.       Right eye: No discharge.        Left eye: No discharge.     Conjunctiva/sclera: Conjunctivae normal.     Comments: Patient wears glasses.   Cardiovascular:     Comments: Heart regular rate and rhythm. No murmurs, rubs, or gallops noted. Per nursing staff, dorsalis pedis and posterior tibial pulses fully intact bilaterally. Pulmonary:     Effort: Pulmonary effort is normal. No respiratory distress.     Comments: Lungs CTA in bilateral anterior and posterior lung fields.  Musculoskeletal:        General: Normal range of motion.     Cervical back: Normal range of motion.  Skin:    Comments: Approximately 8 cm x 6 cm area of poorly demarcated, blanching erythema noted on patient's left lower anterior shin that is  not warm, cold or tender to palpation, with multiple small red/orange scab-like lesions dispersed throughout the erythema (patient reports that these lesions are due to him scratching the area), and without any signs of fluctuance, swelling, ulceration, or drainage/discharge noted.  No dermatologic abnormalities noted of patient's right lower extremity at this time.  Neurological:     General:  No focal deficit present.     Mental Status: He is alert and oriented to person, place, and time.     Comments: No tremor noted.  Psychiatric:        Attention and Perception: Attention and perception normal. He does not perceive auditory or visual hallucinations.        Mood and Affect: Mood is anxious and depressed.        Speech: Speech normal.        Behavior: Behavior is not agitated, slowed, aggressive, hyperactive or combative. Behavior is cooperative.        Thought Content: Thought content is not delusional. Thought content includes suicidal ideation. Thought content does not include homicidal ideation. Thought content includes suicidal plan.     Comments: Affect mood-congruent and tearful. Patient reports he thinks he may be experiencing some paranoia, but is not entirely sure if he is experiencing paranoia or not (see HPI for details).    Review of Systems  Constitutional:  Positive for malaise/fatigue and weight loss. Negative for chills, diaphoresis and fever.  HENT:  Negative for congestion.   Respiratory:  Negative for cough and shortness of breath.   Cardiovascular:  Negative for chest pain and palpitations.       + for intermittent chest tightness.   Gastrointestinal:  Positive for nausea and vomiting. Negative for abdominal pain, constipation and diarrhea.  Musculoskeletal:  Negative for joint pain and myalgias.  Skin:  Positive for rash.  Neurological:  Negative for dizziness, seizures and headaches.       + for chronic intermittent generalized tremor.  Psychiatric/Behavioral:   Positive for depression, substance abuse and suicidal ideas. Negative for hallucinations and memory loss. The patient is nervous/anxious and has insomnia.   All other systems reviewed and are negative. Patient also endorses history of experiencing chronic daily intermittent nonradiating left-sided chest tightness since September 2022.  Patient reports that this chest tightness will wax and wane and he can go periods of time throughout the day without experiencing it at all and sometimes he will randomly experience that during the day.  He denies history of MI or any other cardiac conditions.  Patient does endorse history of chronic intermittent shortness of breath which he reports is secondary to his history of asthma.  Patient also endorses history of intermittent nausea and vomiting since September 2022.  He reports that the episodes of emesis have been occurring a few times per month.  He reports that his last episode of emesis was a few days ago.  He reports that his previous episodes of emesis have been clear in color.  He denies hematemesis. Patient does also report history of intermittent rash/redness and itching of his anterior left lower shin area that "comes and goes" a few times per month over the past 1 year.  He reports that this skin complaint he has not been evaluated by a provider before.  Patient also endorses chronic history of intermittent generalized tremor. He denies recent headache, lightheadedness, dizziness, vision changes, chest pain, nausea, abdominal pain, constipation, diarrhea, fever, diaphoresis, or additional pain. He denies headache, lightheadedness, dizziness, vision changes, chest pain, shortness of breath, nausea, abdominal pain, constipation, diarrhea, fever, chills, diaphoresis, any additional pain, or any additional physical symptoms on exam at this time. Patient does endorse intermittent feelings of feeling generally cold.     Vitals: Blood pressure (!) 142/95, pulse 75,  temperature 98.1 F (36.7 C), temperature source Oral, resp. rate 18, SpO2 100 %. There is  no height or weight on file to calculate BMI.  Past Psychiatric History: past psychiatric history significant for depression, anxiety, insomnia, and alcohol abuse.  Please see HPI for further details regarding patient's past psychiatric history.  Is the patient at risk to self? Yes  Has the patient been a risk to self in the past 6 months? No .    Has the patient been a risk to self within the distant past? Yes   Is the patient a risk to others? No   Has the patient been a risk to others in the past 6 months? No   Has the patient been a risk to others within the distant past? No   Past Medical History:  Past Medical History:  Diagnosis Date   Asthma    History reviewed. No pertinent surgical history.  Family History: History reviewed. No pertinent family history.  Social History:  Social History   Socioeconomic History   Marital status: Married    Spouse name: Not on file   Number of children: Not on file   Years of education: Not on file   Highest education level: Not on file  Occupational History   Not on file  Tobacco Use   Smoking status: Never   Smokeless tobacco: Never  Substance and Sexual Activity   Alcohol use: Yes    Comment: Occasionally   Drug use: Never   Sexual activity: Not on file  Other Topics Concern   Not on file  Social History Narrative   Not on file   Social Determinants of Health   Financial Resource Strain: Not on file  Food Insecurity: Not on file  Transportation Needs: Not on file  Physical Activity: Not on file  Stress: Not on file  Social Connections: Not on file  Intimate Partner Violence: Not on file    SDOH:  SDOH Screenings   Alcohol Screen: Not on file  Depression (PHQ2-9): Not on file  Financial Resource Strain: Not on file  Food Insecurity: Not on file  Housing: Not on file  Physical Activity: Not on file  Social Connections: Not  on file  Stress: Not on file  Tobacco Use: Low Risk    Smoking Tobacco Use: Never   Smokeless Tobacco Use: Never   Passive Exposure: Not on file  Transportation Needs: Not on file    Last Labs:  Admission on 10/02/2020, Discharged on 10/02/2020  Component Date Value Ref Range Status   WBC 10/02/2020 7.5  4.0 - 10.5 K/uL Final   RBC 10/02/2020 4.29  4.22 - 5.81 MIL/uL Final   Hemoglobin 10/02/2020 14.6  13.0 - 17.0 g/dL Final   HCT 10/02/2020 40.7  39.0 - 52.0 % Final   MCV 10/02/2020 94.9  80.0 - 100.0 fL Final   MCH 10/02/2020 34.0  26.0 - 34.0 pg Final   MCHC 10/02/2020 35.9  30.0 - 36.0 g/dL Final   RDW 10/02/2020 12.0  11.5 - 15.5 % Final   Platelets 10/02/2020 125 (L)  150 - 400 K/uL Final   nRBC 10/02/2020 0.0  0.0 - 0.2 % Final   Neutrophils Relative % 10/02/2020 63  % Final   Neutro Abs 10/02/2020 4.7  1.7 - 7.7 K/uL Final   Lymphocytes Relative 10/02/2020 26  % Final   Lymphs Abs 10/02/2020 1.9  0.7 - 4.0 K/uL Final   Monocytes Relative 10/02/2020 9  % Final   Monocytes Absolute 10/02/2020 0.7  0.1 - 1.0 K/uL Final   Eosinophils Relative 10/02/2020  1  % Final   Eosinophils Absolute 10/02/2020 0.0  0.0 - 0.5 K/uL Final   Basophils Relative 10/02/2020 1  % Final   Basophils Absolute 10/02/2020 0.1  0.0 - 0.1 K/uL Final   Immature Granulocytes 10/02/2020 0  % Final   Abs Immature Granulocytes 10/02/2020 0.01  0.00 - 0.07 K/uL Final   Performed at [facility], [place], [place] [address]   Sodium 10/02/2020 136  135 - 145 mmol/L Final   Potassium 10/02/2020 3.8  3.5 - 5.1 mmol/L Final   Chloride 10/02/2020 98  98 - 111 mmol/L Final   CO2 10/02/2020 26  22 - 32 mmol/L Final   Glucose, Bld 10/02/2020 104 (H)  70 - 99 mg/dL Final   Glucose reference range applies only to samples taken after fasting for at least 8 hours.   BUN 10/02/2020 10  6 - 20 mg/dL Final   Creatinine, Ser 10/02/2020 0.92  0.61 - 1.24 mg/dL Final   Calcium  10/02/2020 9.5  8.9 - 10.3 mg/dL Final   GFR, Estimated 10/02/2020 >60  >60 mL/min Final   Comment: (NOTE) Calculated using the CKD-EPI Creatinine Equation (2021)    Anion gap 10/02/2020 12  5 - 15 Final   Performed at [facility], [place], [place] [address]   Troponin I (High Sensitivity) 10/02/2020 3  <18 ng/L Final   Comment: (NOTE) Elevated high sensitivity troponin I (hsTnI) values and significant  changes across serial measurements may suggest ACS but many other  chronic and acute conditions are known to elevate hsTnI results.  Refer to the "Links" section for chest pain algorithms and additional  guidance. Performed at [facility], [address] [place], [place] [address]    Glucose-Capillary 10/02/2020 113 (H)  70 - 99 mg/dL Final   Glucose reference range applies only to samples taken after fasting for at least 8 hours.    Allergies: Patient has no known allergies.  PTA Medications: (Not in a hospital admission)   Medical Decision Making  Patient is a 46 year old male with past psychiatric and medical history as stated above who presents to the ED [facility] voluntarily for worsening depression, anxiety, SI with plan, and alcohol abuse.  Based on patient's current presentation, including active SI with plan and patient's inability to contract for safety at this time, the patient appears to be experiencing severe worsening of depressive symptoms and anxiety that is significantly negatively impacting his ability to function in his activities of daily living and the patient appears to be a danger to himself at this time.  Thus, based on these factors, patient meets inpatient psychiatric treatment criteria at this time.    Recommendations  Based on my evaluation the patient does not appear to have an emergency medical condition.  Recommend inpatient psychiatric treatment for the patient.  Patient is agreeable to inpatient  psychiatric treatment. Per [facility], patient is conditionally accepted to [facility] pending negative Flu A&B, COVID PCR test result.  PCR Flu A&B, COVID test ordered. Patient will be admitted to [facility] continuous assessment for further crisis stabilization and treatment while waiting for PCR Flu A&B, COVID results for inpatient psychiatric admission.  Labs/tests ordered and reviewed:  -PCR Flu A&B, COVID: Results pending  -UDS: Negative  -CBC: Red blood cell count slightly reduced at 3.84 MIL/uL.  Hemoglobin within normal limits at 13.3 g/dL.  Hematocrit within normal limits at 39.0%.  MCV slightly elevated at  101.6 fL.  MCH slightly elevated at 34.6 pg.  CBC otherwise unremarkable.  -CMP: Mild hyponatremia noted with serum sodium level slightly reduced at 134 mmol/L (essentially normal).  AST elevated at 82 U/L.  ALT elevated at 68 U/L.  Alkaline phosphatase elevated at 227 U/L.  Based on patient's history and presentation, these lab values do not appear to be indicative of an emergent medical condition at this time.  Furthermore, patient's elevated LFTs and alkaline phosphatase may likely be due to his history of alcohol abuse.  Expect patient's mild hyponatremia to improve with p.o. intake.  CMP otherwise unremarkable.  -Ethanol: Slightly elevated at 11 mg/dL (essentially normal).  -TSH: Significantly elevated at 104.296 uIU/mL (see notes below regarding patient's TSH value).  Of note, patient does have history of untreated hypothyroidism for the past year, except for the past 2 weeks (see HPI and notes below for details).    -Free T3 and free T4 ordered. Free T4 slightly reduced at 0.40 ng/dL. Free T3 results pending.  -Lipid panel: Total cholesterol elevated at 354 mg/dL.  Triglycerides significantly elevated at 941 mg/dL.  HDL within normal limits.  Total cholesterol/HDL ratio within normal limits.  VLDL and LDL cholesterol unable to be calculated on the lipid panel due to  triglycerides being over 400 mg/dL.  Direct LDL cholesterol labs added on as reflex to lipid panel and direct LDL was elevated at 140.1 mg/dL.  Will defer to [facility] treatment team regarding further recommendations for further treatment of patient's hyperlipidemia/hypertriglyceridemia during patient's inpatient psychiatric stay.  -Hemoglobin A1c: Slightly decreased at 4.3%.  -EKG ordered due to patient's reported history of intermittent left-sided chest tightness that he has been experiencing for the past year (see HPI for details).  EKG shows normal sinus rhythm and no acute/concerning findings or signs of ischemia with ventricular rate 75 bpm, PR interval 166 ms, QRS 84 ms, and QT/QTC 404/451 ms.   -Based on patient's description of his physical symptoms, patient's medical history, exam findings, and patient's EKG results, patient's reported intermittent left-sided chest tightness and shortness of breath do not appear to be indicative of an emergent medical condition at this time.  Furthermore, patient's reported intermittent left-sided chest tightness and shortness of breath may potentially be secondary to anxiety and asthma. Thus, no further emergent medical work-up needed at this time.  Will initiate medications for asthma and anxiety (see details below).   Furthermore, Patient's reported symptoms of feeling cold, anxiety, weight gain, depression, and intermittent tremor may likely be secondary/related to patient's hypothyroidism that has mostly been untreated for the past 1 year up until a few weeks ago (see HPI for details).  Patient's vital signs are stable: Blood pressure 142/95, temp afebrile 98.1 F, pulse within normal limits at 75 bpm, respirations 18, even and unlabored, SPO2 100% on room air.  Based on patient's presentation, exam findings, patient's reported symptoms, and patient's vitals, I do not suspect an extreme thyroid process such as thyroid storm or myxedema coma at this time and did not  suspect that patient's decompensated hypothyroidism is emergent at this time.  Free T3 and T4 labs ordered: Free T4 slightly reduced at 0.40 ng/dL. Free T3 results pending. Patient's Synthroid has been restarted: (See details below).  Will defer to patient's treatment team at [facility] for further recommendations regarding adjustment of patient's Synthroid/treatment of patient's hypothyroidism during his inpatient psychiatric stay.   Based on patient's description of his left anterior shin skin lesion/rash and exam findings (see HPI, PE for  details), it is possible that patient may be experiencing cellulitis of his left anterior shin region. Based on my exam, do not suspect a fungal infection at this time.  Additionally, Based on patient's reported history of this skin lesion "coming and going" over the past year, it is possible that patient's skin lesion could be secondary to some form of chronic arterial/venous insufficiency if the etiology of patient's skin lesion is not truly infectious in nature. Patient's medication allergies reviewed with the patient.  Will initiate 5-day Keflex 500 mg to cover potential cellulitis of patient's left anterior shin region (see details below) at this time. If patient's skin lesion does not improve/resolve with antibiotic course, patient may need further non-emergent evaluation of this skin lesion to determine true etiology at that time.  -Additionally, care order placed for patient to wash his left shin/lower extremities with warm or cold water and soap twice daily and keep the left shin/lower extremity dry when not being washed.   Additionally, based on patient's reported history of nausea and vomiting, I do not suspect that patient's nausea and vomiting is indicative of an emergent medical condition at this time either.  Will provide order for Zofran as needed for nausea/vomiting (see details below).  Discussed with the patient reinitiating Zoloft at a lower starting dose  than he was previously taking 1 year ago for patient's symptoms of depression and anxiety.  Patient educated on side effect profile of Zoloft and patient verbalizes understanding of this education.  After discussion, patient agreed to reinitiate Zoloft (see dosing details below).  Patient reports that he was previously taking Synthroid 175 mcg p.o. daily every morning for his hypothyroidism, but he states that he stopped taking this medication about 1 year ago due to running out of health insurance at that time.  Patient reports that for the past 2 weeks, he has been taking his sister's Synthroid prescription for 175 mcg p.o. daily every morning.  He reports that aside from missing some doses for the past few days, he has been taking this medication every day for the past 2 weeks without any adverse side effects noted.  Due to patient reportedly tolerating this medication well over the past 2 weeks after reinitiating 2 weeks ago, will reinitiate patient's Synthroid at 175 mcg p.o. daily every morning (see details below).   Will initiate the following medications:  -Restart Zoloft at 50 mg p.o. daily at bedtime for MDD, anxiety  -Initiate Keflex 500 mg p.o. 4 times daily/every 6 hours for 5 days for potential cellulitis of the left anterior shin  -Restart previous home medication of Synthroid 175 mcg p.o. daily before breakfast for hypothyroidism  -Continue home medication of albuterol 180 MCG/ACT inhaler at 1 to 2 puffs p.o. every 6 hours as needed for wheezing/shortness of breath  -Tylenol 650 mg p.o. every 6 hours as needed for mild pain  -Maalox/Mylanta 30 mL p.o. every 4 hours as needed for indigestion  -Milk of Magnesia 30 mL p.o. daily as needed for mild constipation  -Initiate Nicotine patch 14 mg daily for tobacco cessation/nicotine cravings  -Initiate Trazodone 50 mg p.o. at bedtime PRN for sleep  Patient denies history of alcohol-related withdrawal symptoms or seizures.  Patient does report  history of chronic intermittent generalized tremor.  Patient is not tremulous on my exam and patient does not appear to be experiencing alcohol withdrawal at this time.  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  In order to cover for potential development of future alcohol withdrawal symptoms, will initiate CIWA protocol with orders for the following medications:  -Vistaril 25 mg p.o. every 6 hours as needed for anxiety/agitation or CIWA score less than or equal to 10  -Imodium capsule 2 to 4 mg p.o. as needed for diarrhea or loose stools  -Ativan 1 mg p.o. every 6 hours as needed for CIWA score greater than 10  -Multivitamin with minerals tablet: 1 tablet p.o. daily for nutritional supplementation  -Zofran ODT 4 mg p.o. every 6 hours as needed for nausea/vomiting  -Thiamine tablet 100 mg p.o. daily for nutritional supplementation   Prescilla Sours, PA-C 01/19/21  10:31 PM
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
## 2021-01-20 ENCOUNTER — Inpatient Hospital Stay (HOSPITAL_COMMUNITY)
Admission: AD | Admit: 2021-01-20 | Discharge: 2021-01-24 | DRG: 885 | Disposition: A | Discharge status: Home / Self Care | Payer: Federal, State, Local not specified - Other | Source: Intra-hospital | Attending: Psychiatry | Admitting: Psychiatry

## 2021-01-20 ENCOUNTER — Encounter (HOSPITAL_COMMUNITY): Payer: Self-pay | Admitting: Student

## 2021-01-20 ENCOUNTER — Other Ambulatory Visit: Payer: Self-pay

## 2021-01-20 DIAGNOSIS — E039 Hypothyroidism, unspecified: Secondary | ICD-10-CM | POA: Diagnosis present

## 2021-01-20 DIAGNOSIS — F332 Major depressive disorder, recurrent severe without psychotic features: Principal | ICD-10-CM | POA: Diagnosis present

## 2021-01-20 DIAGNOSIS — J45909 Unspecified asthma, uncomplicated: Secondary | ICD-10-CM | POA: Diagnosis present

## 2021-01-20 DIAGNOSIS — F1010 Alcohol abuse, uncomplicated: Secondary | ICD-10-CM | POA: Diagnosis present

## 2021-01-20 DIAGNOSIS — F909 Attention-deficit hyperactivity disorder, unspecified type: Secondary | ICD-10-CM | POA: Diagnosis present

## 2021-01-20 DIAGNOSIS — Z20822 Contact with and (suspected) exposure to covid-19: Secondary | ICD-10-CM | POA: Diagnosis present

## 2021-01-20 DIAGNOSIS — F603 Borderline personality disorder: Secondary | ICD-10-CM | POA: Diagnosis present

## 2021-01-20 DIAGNOSIS — L03116 Cellulitis of left lower limb: Secondary | ICD-10-CM | POA: Diagnosis present

## 2021-01-20 DIAGNOSIS — R45851 Suicidal ideations: Secondary | ICD-10-CM | POA: Diagnosis present

## 2021-01-20 LAB — HEPATIC FUNCTION PANEL
ALT: 54 U/L — ABNORMAL HIGH (ref 0–44)
AST: 54 U/L — ABNORMAL HIGH (ref 15–41)
Albumin: 4.1 g/dL (ref 3.5–5.0)
Alkaline Phosphatase: 192 U/L — ABNORMAL HIGH (ref 38–126)
Bilirubin, Direct: 0.3 mg/dL — ABNORMAL HIGH (ref 0.0–0.2)
Indirect Bilirubin: 1.1 mg/dL — ABNORMAL HIGH (ref 0.3–0.9)
Total Bilirubin: 1.4 mg/dL — ABNORMAL HIGH (ref 0.3–1.2)
Total Protein: 7.5 g/dL (ref 6.5–8.1)

## 2021-01-20 LAB — HEMOGLOBIN A1C
Hgb A1c MFr Bld: 4.3 % — ABNORMAL LOW (ref 4.8–5.6)
Mean Plasma Glucose: 76.71 mg/dL

## 2021-01-20 LAB — RESP PANEL BY RT-PCR (FLU A&B, COVID) ARPGX2
Influenza A by PCR: NEGATIVE
Influenza B by PCR: NEGATIVE
SARS Coronavirus 2 by RT PCR: NEGATIVE

## 2021-01-20 LAB — CBC WITH DIFFERENTIAL/PLATELET
Abs Immature Granulocytes: 0.02 10*3/uL (ref 0.00–0.07)
Basophils Absolute: 0.1 10*3/uL (ref 0.0–0.1)
Basophils Relative: 1 %
Eosinophils Absolute: 0.1 10*3/uL (ref 0.0–0.5)
Eosinophils Relative: 2 %
HCT: 39 % (ref 39.0–52.0)
Hemoglobin: 13.3 g/dL (ref 13.0–17.0)
Immature Granulocytes: 0 %
Lymphocytes Relative: 31 %
Lymphs Abs: 1.7 10*3/uL (ref 0.7–4.0)
MCH: 34.6 pg — ABNORMAL HIGH (ref 26.0–34.0)
MCHC: 34.1 g/dL (ref 30.0–36.0)
MCV: 101.6 fL — ABNORMAL HIGH (ref 80.0–100.0)
Monocytes Absolute: 0.4 10*3/uL (ref 0.1–1.0)
Monocytes Relative: 7 %
Neutro Abs: 3.3 10*3/uL (ref 1.7–7.7)
Neutrophils Relative %: 59 %
Platelets: 151 10*3/uL (ref 150–400)
RBC: 3.84 MIL/uL — ABNORMAL LOW (ref 4.22–5.81)
RDW: 15 % (ref 11.5–15.5)
WBC: 5.6 10*3/uL (ref 4.0–10.5)
nRBC: 0 % (ref 0.0–0.2)

## 2021-01-20 LAB — TSH: TSH: 104.296 u[IU]/mL — ABNORMAL HIGH (ref 0.350–4.500)

## 2021-01-20 LAB — COMPREHENSIVE METABOLIC PANEL
ALT: 68 U/L — ABNORMAL HIGH (ref 0–44)
AST: 82 U/L — ABNORMAL HIGH (ref 15–41)
Albumin: 3.8 g/dL (ref 3.5–5.0)
Alkaline Phosphatase: 227 U/L — ABNORMAL HIGH (ref 38–126)
Anion gap: 9 (ref 5–15)
BUN: 6 mg/dL (ref 6–20)
CO2: 26 mmol/L (ref 22–32)
Calcium: 8.9 mg/dL (ref 8.9–10.3)
Chloride: 99 mmol/L (ref 98–111)
Creatinine, Ser: 0.63 mg/dL (ref 0.61–1.24)
GFR, Estimated: >60 mL/min (ref >60)
Glucose, Bld: 77 mg/dL (ref 70–99)
Potassium: 3.5 mmol/L (ref 3.5–5.1)
Sodium: 134 mmol/L — ABNORMAL LOW (ref 135–145)
Total Bilirubin: 1.1 mg/dL (ref 0.3–1.2)
Total Protein: 6.8 g/dL (ref 6.5–8.1)

## 2021-01-20 LAB — PROTIME-INR
INR: 1 (ref 0.8–1.2)
Prothrombin Time: 13.3 seconds (ref 11.4–15.2)

## 2021-01-20 LAB — ETHANOL: Alcohol, Ethyl (B): 11 mg/dL — ABNORMAL HIGH (ref <10)

## 2021-01-20 LAB — LIPID PANEL
Cholesterol: 354 mg/dL — ABNORMAL HIGH (ref 0–200)
HDL: 59 mg/dL (ref >40)
LDL Cholesterol: UNDETERMINED mg/dL (ref 0–99)
Total CHOL/HDL Ratio: 6 RATIO
Triglycerides: 941 mg/dL — ABNORMAL HIGH (ref <150)
VLDL: UNDETERMINED mg/dL (ref 0–40)

## 2021-01-20 LAB — LDL CHOLESTEROL, DIRECT: Direct LDL: 140.1 mg/dL — ABNORMAL HIGH (ref 0–99)

## 2021-01-20 LAB — T4, FREE: Free T4: 0.4 ng/dL — ABNORMAL LOW (ref 0.61–1.12)

## 2021-01-20 LAB — LIPASE, BLOOD: Lipase: 39 U/L (ref 11–51)

## 2021-01-20 MED ORDER — TRAZODONE HCL 50 MG PO TABS
50.0000 mg | ORAL_TABLET | Freq: Every evening | ORAL | Status: DC | PRN
  Administered 2021-01-20: 50 mg via ORAL
  Filled 2021-01-20: qty 1

## 2021-01-20 MED ORDER — MAGNESIUM HYDROXIDE 400 MG/5ML PO SUSP: 30.0000 mL | Freq: Every day | ORAL | Status: DC | PRN

## 2021-01-20 MED ORDER — NICOTINE 14 MG/24HR TD PT24: 14.0000 mg | MEDICATED_PATCH | Freq: Every day | TRANSDERMAL | Status: DC

## 2021-01-20 MED ORDER — SERTRALINE HCL 50 MG PO TABS
50.0000 mg | ORAL_TABLET | Freq: Every day | ORAL | Status: DC
  Administered 2021-01-20 – 2021-01-22 (×3): 50 mg via ORAL
  Filled 2021-01-20 (×5): qty 1

## 2021-01-20 MED ORDER — LOPERAMIDE HCL 2 MG PO CAPS: 2.0000 mg | ORAL_CAPSULE | ORAL | Status: AC | PRN

## 2021-01-20 MED ORDER — ALBUTEROL SULFATE HFA 108 (90 BASE) MCG/ACT IN AERS: 1.0000 | INHALATION_SPRAY | Freq: Four times a day (QID) | RESPIRATORY_TRACT | Status: DC | PRN

## 2021-01-20 MED ORDER — LEVOTHYROXINE SODIUM 175 MCG PO TABS
175.0000 ug | ORAL_TABLET | Freq: Every day | ORAL | Status: DC
Start: 2021-01-20 — End: 2021-01-20
  Administered 2021-01-20: 175 ug via ORAL
  Filled 2021-01-20 (×3): qty 1

## 2021-01-20 MED ORDER — ALUM & MAG HYDROXIDE-SIMETH 200-200-20 MG/5ML PO SUSP: 30.0000 mL | ORAL | Status: DC | PRN

## 2021-01-20 MED ORDER — ADULT MULTIVITAMIN W/MINERALS CH
1.0000 | ORAL_TABLET | Freq: Every day | ORAL | Status: DC
  Administered 2021-01-20 – 2021-01-24 (×5): 1 via ORAL
  Filled 2021-01-20 (×8): qty 1

## 2021-01-20 MED ORDER — LIOTHYRONINE SODIUM 25 MCG PO TABS
25.0000 ug | ORAL_TABLET | Freq: Every day | ORAL | Status: DC
  Administered 2021-01-21 – 2021-01-24 (×4): 25 ug via ORAL
  Filled 2021-01-20 (×4): qty 1
  Filled 2021-01-20: qty 7
  Filled 2021-01-20: qty 1

## 2021-01-20 MED ORDER — CEPHALEXIN 500 MG PO CAPS
500.0000 mg | ORAL_CAPSULE | Freq: Four times a day (QID) | ORAL | Status: DC
  Administered 2021-01-20 – 2021-01-24 (×14): 500 mg via ORAL
  Filled 2021-01-20 (×19): qty 1

## 2021-01-20 MED ORDER — LORAZEPAM 1 MG PO TABS: 1.0000 mg | ORAL_TABLET | Freq: Four times a day (QID) | ORAL | Status: AC | PRN

## 2021-01-20 MED ORDER — THIAMINE HCL 100 MG PO TABS
100.0000 mg | ORAL_TABLET | Freq: Every day | ORAL | Status: DC
  Administered 2021-01-20 – 2021-01-24 (×5): 100 mg via ORAL
  Filled 2021-01-20 (×7): qty 1

## 2021-01-20 MED ORDER — HYDROXYZINE HCL 25 MG PO TABS
25.0000 mg | ORAL_TABLET | Freq: Four times a day (QID) | ORAL | Status: DC | PRN
  Administered 2021-01-20 – 2021-01-21 (×2): 25 mg via ORAL
  Filled 2021-01-20 (×2): qty 1

## 2021-01-20 MED ORDER — ACETAMINOPHEN 325 MG PO TABS: 650.0000 mg | ORAL_TABLET | Freq: Four times a day (QID) | ORAL | Status: DC | PRN

## 2021-01-20 MED ORDER — NICOTINE 14 MG/24HR TD PT24
14.0000 mg | MEDICATED_PATCH | Freq: Every day | TRANSDERMAL | Status: DC
  Administered 2021-01-20 – 2021-01-24 (×5): 14 mg via TRANSDERMAL
  Filled 2021-01-20 (×7): qty 1

## 2021-01-20 MED ORDER — ONDANSETRON 4 MG PO TBDP
4.0000 mg | ORAL_TABLET | Freq: Four times a day (QID) | ORAL | Status: AC | PRN
  Administered 2021-01-22: 4 mg via ORAL
  Filled 2021-01-20: qty 1
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
## 2021-01-20 NOTE — [facility] Group Notes (Signed)
[facility] Group Notes:  (Nursing/MHT/Case Management/Adjunct)  Date:  01/20/2021  Time:  4:30 PM  Type of Therapy:  Group Therapy  Participation Level:  Active  Participation Quality:  Appropriate  Affect:  Appropriate  Cognitive:  Appropriate  Insight:  Appropriate  Engagement in Group:  Engaged  Modes of Intervention:  Discussion  Summary of Progress/Problems:Patient demonstrated an understanding of some of the reasons for depression, as evidenced by verbalizing situations that he identifies as triggers.    Reymundo Poll 01/20/2021, 4:30 PM

## 2021-01-20 NOTE — Plan of Care (Signed)
  Problem: Education: Goal: Knowledge of  General Education information/materials will improve Outcome: Progressing Goal: Emotional status will improve Outcome: Progressing Goal: Mental status will improve Outcome: Progressing   

## 2021-01-20 NOTE — ED Notes (Signed)
[facility] called to take Mr Arntz to [facility]

## 2021-01-20 NOTE — Discharge Instructions (Signed)
Patient to be transferred to [facility]. 

## 2021-01-20 NOTE — ED Notes (Signed)
Pt enroute to [facility] via safe transport

## 2021-01-20 NOTE — Progress Notes (Signed)
Progress note  Pt found in bed. Pt was allowed to rest given their early admit time. Pt was compliant with medication administration once they awoke. Pt had c/o of anxiety, racing thoughts, and depression regarding their discharge plan and moving back to [place] if things aren't straightened out at their apartment. Pt denies any physical pain. Pt has been seen in the hallway pacing. Pt denies any detox symptoms besides anxiety. Pt is pleasant. Pt denies si/hi/ah/vh and verbally agrees to approach staff if these become apparent or before harming themselves/others while at [facility]. "I would have killed myself 3 years ago if I was going to do it. I can't do that with my kids."  A: Pt provided support and encouragement. Pt given medication per protocol and standing orders. Q77m safety checks implemented and continued.  R: Pt safe on the unit. Will continue to monitor.

## 2021-01-20 NOTE — BH Assessment (Addendum)
Per Hassie Bruce, patient accepted to [facility] Adult Unit Room 407-1 by [facility]. Arrival time is now pending negative covid. Attending is Dr. Sherron Flemings.

## 2021-01-20 NOTE — Progress Notes (Signed)
°   01/20/21 0500  Psych Admission Type (Psych Patients Only)  Admission Status Voluntary  Psychosocial Assessment  Patient Complaints Depression;Crying spells;Hopelessness;Helplessness;Anhedonia;Decreased concentration;Appetite decrease;None;Insomnia (paranoia)  Eye Contact Fair  Facial Expression Anxious  Affect Depressed;Irritable  Speech Unremarkable  Interaction Other (Comment) (appropriate)  Motor Activity Slow  Appearance/Hygiene Unremarkable;In scrubs  Behavior Characteristics Cooperative;Appropriate to situation  Mood Depressed;Anxious  Thought Process  Coherency WDL  Content Blaming others;Blaming self;Paranoia  Delusions None reported or observed  Perception WDL  Hallucination None reported or observed  Judgment Impaired  Confusion None  Danger to Self  Current suicidal ideation? Denies  Danger to Others  Danger to Others None reported or observed   Pt denies SI, HI, AVH and pain. Pt endorses suicidal thoughts intermittently. Pt has been feeling hopeless, helpless, crying spells and depression recently. Has relapsed on alcohol the past year. Pt divorced in November 2021 and is having custody issues with his ex-wife concerning their 63 year old twin boys. Pt sister recognized he was having a hard time and told him to get help for his mental health.

## 2021-01-20 NOTE — Group Note (Signed)
Recreation Therapy Group Note   Group Topic:Animal Assisted Therapy   Group Date: 01/20/2021 Start Time: 1430 End Time: 1510 Facilitators: Caroll Rancher, LRT,CTRS Location: [address] Hall Dayroom   Animal-Assisted Activity (AAA) Program Checklist/Progress Note Patient Eligibility Criteria Checklist & Daily Group note for Rec Tx Intervention   AAA/T Program Assumption of Risk Form signed by Patient/ or Parent Legal Guardian YES  Patient understands their participation is voluntary YES  Group Description: Patients provided opportunity to interact with trained and credentialed [facility] Therapy dog and the community volunteer/dog handler. Patients practiced appropriate animal interaction and were educated on dog safety outside of the hospital in common community settings. Patients were allowed to use dog toys and other items to practice commands, engage the dog in play, and/or complete routine aspects of animal care.   Education: Charity fundraiser, Health visitor, Communication & Social Skills    Affect/Mood: N/A   Participation Level: Did not attend    Clinical Observations/Individualized Feedback:     Plan: Continue to engage patient in RT group sessions 2-3x/week.   Caroll Rancher, Antonietta Jewel 01/20/2021 3:49 PM
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
## 2021-01-20 NOTE — ED Provider Notes (Addendum)
FBC/OBS ASAP Discharge Summary  Date and Time: 01/20/2021 4:21 AM  Name: Danny West  MRN:  [id number]   Discharge Diagnoses:  Final diagnoses:  MDD (major depressive disorder), recurrent severe, without psychosis ([place])  Alcohol use disorder, severe, dependence ([place])  Suicidal ideation    Subjective: Per this provider's recent 01/19/2021 [place] Admission H&P HPI:   "Danny West is a 46 year old male with past psychiatric history significant for depression, anxiety, insomnia, and alcohol abuse, as well as past medical history significant for asthma and hypothyroidism, who presents to the [facility] behavioral health urgent care [facility]) unaccompanied as a voluntary walk-in for symptoms of worsening depression, worsening anxiety, suicidal ideation with plan, and alcohol abuse.  Patient reports that he believes that he is currently experiencing "another mental breakdown".  Patient states "I'm just tired of fighting and the money and the divorce and the fight with my kids".  Patient endorses active SI on exam with intent and plan to "jump in front of a similar train".  Patient states that the main stressors in his life that are currently contributing to his worsening depression, anxiety, and SI are issues with his ex-wife, not being able to see his children as often as he would like to, and financial stressors.  Patient states that he has been separated from his ex-wife since 09/28/2017, in which patient reports that his ex-wife took his children from him at that time.  Patient states that he is currently only allowed custody of his children for 1 day / 24 hours every other week, which patient reports is unsupervised custody.  Patient reports that he has been hoping to obtain 50-50 shared custody of his children, patient states that he is fearful that he will not be able to obtain this due to his current mental health issues.  Patient also reports that he took his children with him to his sister's home in  [place] for the weekend this past weekend without telling his ex-wife because he did not know when he was going to see his children again and he reports that his ex-wife is upset with him about this.   Patient denies history of any past suicide attempts.  He endorses history of self-injurious behavior via cutting, but he denies any cutting behavior since he was in high school.  He denies history of intentionally burning himself.  He denies homicidal ideations.  He denies AVH.  Patient reports that he has suspicion that the person living in the floor below him in his apartment has possibly installed a meth lab in their apartment unit, but he states that he is not sure if this is actually true.  Patient reports that he has this suspicion due to smells of specific fumes that he has noticed coming from the apartment unit below him.  Patient reports that he may be experiencing paranoia regarding this matter, but he states that he is not entirely sure if he is paranoid about this issue or not because some people have told him that they do notice the same smells he does coming from the apartment below him and some people do not.  He reports his sleep is poor.  He states that he sleeps about 6 hours total per night but he states that he only sleeps a few hours at a time and has issues with falling asleep initially at night and also staying asleep throughout the night.  He denies history of recent nightmares.  He endorses strong feelings of anhedonia and states that  he does not enjoy doing any hobbies anymore due to his symptoms of depression.  He endorses feelings of guilt, hopelessness, and worthlessness over the past few months.  Patient also endorses declines in his energy, concentration, and appetite over the past few months.  Patient also reports that he has lost some weight over the past few months, but he states that he is unsure as to how much weight he has specifically lost.   Patient reports that in  September 2019 when his ex-wife first took his children away from him, he became very upset and depressed and reacted to this by "spraying a solvent" all over his home and "acting like I was going to burn the house down", although patient states that he was not actually going to burn the house down at that time.  Patient states that he called the police and informed them of his plans to burn his home down at that time, which led to the patient being admitted [facility].  Patient reports that he was released from the jail after 2 weeks at that time in September 2019 and he denies any other past legal issues.  Patient reports that he is a documented felon due to these issues noted above from 2019.  Patient denies history of any additional previous inpatient psychiatric hospitalizations.  Patient denies currently taking any psychotropic medications at this time.  Patient also denies having a psychiatrist at this time. He reports that he used to take Zoloft for about 18 months to 2 years in the past, which was prescribed by his PCP and he states that this medication was helpful for his depressive symptoms and anxiety.  He denies experiencing any adverse side effects while he was taking this medication.  He reports that he stopped taking this medication about 1 year ago due to him running out of his prescription and losing health insurance at that time.  Per chart review, there is a previous prescription for Zoloft 100 mg p.o. daily in patient's chart from 12/11/2019.  Patient also reports that he took Prozac in the past as well prior to taking Zoloft.  Per chart review, patient has previous prescription in his chart for Prozac 40 mg p.o. daily. Patient reports that he [facility].  He reports that he saw his therapist last week and prior to that he had not seen his therapist for about 2 months.  He reports that  he believes he does not get to see his therapist as often as he would like to.  Patient states that his therapist communicated with his parole officer last Thursday and that his therapist and parole officer were concerned about the patient's mental health at that time.  Patient endorses history of alcohol abuse.  He reports that he currently drinks 2 to 4 16 ounce beers per day and reports he has been drinking alcohol at this rate for the past 2 years.  He reports that prior to 2 years ago, he was drinking alcohol at a lower rate.  He reports that he drinks alcohol in order to help with his insomnia.  He reports that his last alcohol consumption was a few hours ago, in which she states that he drank 1 16 ounce beer at that time.  He reports that in addition to this, he also drank a few additional 16 ounce beers prior within the past 24 hours.  He denies history of any alcohol related withdrawal symptoms or seizures.  Patient endorses using smokeless tobacco nearly all day every day.  He endorses history of past marijuana use, but he states that he has not used marijuana since February 2022. 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  Per PDMP review, no documented controlled substance prescriptions noted in PDMP database.  However, per chart review, patient does appear to have previous prescription for Vyvanse 40 mg p.o. every morning in patient's EMR. He denies any illicit substance use at this time.  Patient reports that he received residential rehab treatment from October 2019 to November 2019 at [facility] in [place] for alcohol and Adderall/Vyvanse abuse/addiction.  Patient currently lives in [place] alone.  He denies access to firearms.  He reports that he is currently employed part-time at [employer] and has been employed there since May 2022.  He denies having any sources of social support at this time.   Patient is unable to contract for safety at this time.   Patient also endorses history of experiencing chronic daily intermittent nonradiating left-sided chest tightness since September 2022.  Patient reports that this chest tightness will wax and wane and he can go periods of time throughout the day without experiencing it at all and sometimes he will randomly experience that during the day.  He denies history of MI or any other cardiac conditions.  Patient does endorse history of chronic intermittent shortness of breath which he reports is secondary to his history of asthma.  Patient also endorses history of intermittent nausea and vomiting since September 2022.  He reports that the episodes of emesis have been occurring a few times per month.  He reports that his last episode of emesis was a few days ago.  He reports that his previous episodes of emesis have been clear in color.  He denies hematemesis. Patient does also report history of intermittent rash/redness and itching of his anterior left lower shin area that "comes and goes" a few times per month over the past 1 year.  He reports that this skin complaint he has not been evaluated by a provider before.  Patient also endorses chronic history of intermittent generalized tremor. He denies recent headache, lightheadedness, dizziness, vision changes, chest pain, nausea, abdominal pain, constipation, diarrhea, fever, diaphoresis, or additional pain. He denies headache, lightheadedness, dizziness, vision changes, chest pain, shortness of breath, nausea, abdominal pain, constipation, diarrhea, fever, chills, diaphoresis, any additional pain, or any additional physical symptoms on exam at this time. Patient does endorse intermittent feelings of feeling generally cold.     On exam, patient is sitting upright with disheveled appearance, in no acute distress.  Eye contact is good.  Speech is clear and coherent with normal rate and volume.  Mood is anxious and  depressed with congruent, tearful affect.  Thought process is coherent, goal directed, and linear.  Patient is alert and oriented x4, cooperative, and answers all questions appropriately during the evaluation.  No indication the patient is responding to internal/external stimuli on exam.  No delusional thought content noted on exam."  Futhermore, clinical reasoning documented in this provider's recent 01/19/2021 [facility] Admission H&P shown below:  "Recommend inpatient psychiatric treatment for the patient.  Patient is agreeable to inpatient psychiatric treatment. Per [place] [facility], patient is conditionally accepted to [facility] pending negative Flu A&B, COVID PCR test result.  PCR  Flu A&B, COVID test ordered. Patient will be admitted to [facility] continuous assessment for further crisis stabilization and treatment while waiting for PCR Flu A&B, COVID results for inpatient psychiatric admission.   Labs/tests ordered and reviewed:             -PCR Flu A&B, COVID: Results pending             -UDS: Negative             -CBC: Red blood cell count slightly reduced at 3.84 MIL/uL.  Hemoglobin within normal limits at 13.3 g/dL.  Hematocrit within normal limits at 39.0%.  MCV slightly elevated at 101.6 fL.  MCH slightly elevated at 34.6 pg.  CBC otherwise unremarkable.             -CMP: Mild hyponatremia noted with serum sodium level slightly reduced at 134 mmol/L (essentially normal).  AST elevated at 82 U/L.  ALT elevated at 68 U/L.  Alkaline phosphatase elevated at 227 U/L.  Based on patient's history and presentation, these lab values do not appear to be indicative of an emergent medical condition at this time.  Furthermore, patient's elevated LFTs and alkaline phosphatase may likely be due to his history of alcohol abuse.  Expect patient's mild hyponatremia to improve with p.o. intake.  CMP otherwise unremarkable.             -Ethanol: Slightly elevated at 11 mg/dL (essentially normal).              -TSH: Significantly elevated at 104.296 uIU/mL (see notes below regarding patient's TSH value).  Of note, patient does have history of untreated hypothyroidism for the past year, except for the past 2 weeks (see HPI and notes below for details).                          -Free T3 and free T4 ordered. Free T4 slightly reduced at 0.40 ng/dL. Free T3 results pending.             -Lipid panel: Total cholesterol elevated at 354 mg/dL.  Triglycerides significantly elevated at 941 mg/dL.  HDL within normal limits.  Total cholesterol/HDL ratio within normal limits.  VLDL and LDL cholesterol unable to be calculated on the lipid panel due to triglycerides being over 400 mg/dL.  Direct LDL cholesterol labs added on as reflex to lipid panel and direct LDL was elevated at 140.1 mg/dL.  Will defer to [facility] treatment team regarding further recommendations for further treatment of patient's hyperlipidemia/hypertriglyceridemia during patient's inpatient psychiatric stay.             -Hemoglobin A1c: Slightly decreased at 4.3%.             -EKG ordered due to patient's reported history of intermittent left-sided chest tightness that he has been experiencing for the past year (see HPI for details).  EKG shows normal sinus rhythm and no acute/concerning findings or signs of ischemia with ventricular rate 75 bpm, PR interval 166 ms, QRS 84 ms, and QT/QTC 404/451 ms.                         -Based on patient's description of his physical symptoms, patient's medical history, exam findings, and patient's EKG results, patient's reported intermittent left-sided chest tightness and shortness of breath do not appear to be indicative of an emergent medical condition at this time.  Furthermore, patient's reported intermittent left-sided chest tightness and  shortness of breath may potentially be secondary to anxiety and asthma. Thus, no further emergent medical work-up needed at this time.  Will initiate medications for asthma and anxiety  (see details below).    Furthermore, Patient's reported symptoms of feeling cold, anxiety, weight gain, depression, and intermittent tremor may likely be secondary/related to patient's hypothyroidism that has mostly been untreated for the past 1 year up until a few weeks ago (see HPI for details).  Patient's vital signs are stable: Blood pressure 142/95, temp afebrile 98.1 F, pulse within normal limits at 75 bpm, respirations 18, even and unlabored, SPO2 100% on room air.  Based on patient's presentation, exam findings, patient's reported symptoms, and patient's vitals, I do not suspect an extreme thyroid process such as thyroid storm or myxedema coma at this time and did not suspect that patient's decompensated hypothyroidism is emergent at this time.  Free T3 and T4 labs ordered: Free T4 slightly reduced at 0.40 ng/dL. Free T3 results pending. Patient's Synthroid has been restarted: (See details below).  Will defer to patient's treatment team at [facility] for further recommendations regarding adjustment of patient's Synthroid/treatment of patient's hypothyroidism during his inpatient psychiatric stay.    Based on patient's description of his left anterior shin skin lesion/rash and exam findings (see HPI, PE for details), it is possible that patient may be experiencing cellulitis of his left anterior shin region. Based on my exam, do not suspect a fungal infection at this time.  Additionally, Based on patient's reported history of this skin lesion "coming and going" over the past year, it is possible that patient's skin lesion could be secondary to some form of chronic arterial/venous insufficiency if the etiology of patient's skin lesion is not truly infectious in nature. Patient's medication allergies reviewed with the patient.  Will initiate 5-day Keflex 500 mg to cover potential cellulitis of patient's left anterior shin region (see details below) at this time. If patient's skin lesion does not improve/resolve  with antibiotic course, patient may need further non-emergent evaluation of this skin lesion to determine true etiology at that time.             -Additionally, care order placed for patient to wash his left shin/lower extremities with warm or cold water and soap twice daily and keep the left shin/lower extremity dry when not being washed.    Additionally, based on patient's reported history of nausea and vomiting, I do not suspect that patient's nausea and vomiting is indicative of an emergent medical condition at this time either.  Will provide order for Zofran as needed for nausea/vomiting (see details below).   Discussed with the patient reinitiating Zoloft at a lower starting dose than he was previously taking 1 year ago for patient's symptoms of depression and anxiety.  Patient educated on side effect profile of Zoloft and patient verbalizes understanding of this education.  After discussion, patient agreed to reinitiate Zoloft (see dosing details below).   Patient reports that he was previously taking Synthroid 175 mcg p.o. daily every morning for his hypothyroidism, but he states that he stopped taking this medication about 1 year ago due to running out of health insurance at that time.  Patient reports that for the past 2 weeks, he has been taking his sister's Synthroid prescription for 175 mcg p.o. daily every morning.  He reports that aside from missing some doses for the past few days, he has been taking this medication every day for the past 2 weeks without any adverse  side effects noted.  Due to patient reportedly tolerating this medication well over the past 2 weeks after reinitiating 2 weeks ago, will reinitiate patient's Synthroid at 175 mcg p.o. daily every morning (see details below).    Will initiate the following medications:             -Restart Zoloft at 50 mg p.o. daily at bedtime for MDD, anxiety             -Initiate Keflex 500 mg p.o. 4 times daily/every 6 hours for 5 days for  potential cellulitis of the left anterior shin             -Restart previous home medication of Synthroid 175 mcg p.o. daily before breakfast for hypothyroidism             -Continue home medication of albuterol 180 MCG/ACT inhaler at 1 to 2 puffs p.o. every 6 hours as needed for wheezing/shortness of breath             -Tylenol 650 mg p.o. every 6 hours as needed for mild pain             -Maalox/Mylanta 30 mL p.o. every 4 hours as needed for indigestion             -Milk of Magnesia 30 mL p.o. daily as needed for mild constipation             -Initiate Nicotine patch 14 mg daily for tobacco cessation/nicotine cravings -Initiate Trazodone 50 mg p.o. at bedtime PRN for sleep   Patient denies history of alcohol-related withdrawal symptoms or seizures.  Patient does report history of chronic intermittent generalized tremor.  Patient is not tremulous on my exam and patient does not appear to be experiencing alcohol withdrawal at this time.  Thus, based on patient's current presentation and patient's reported history of addiction to controlled substances (Adderall and Vyvanse), do not believe that patient needs scheduled Ativan taper at this time, but will provide order for as needed Ativan for CIWA score greater than 10 (see details below).  In order to cover for potential development of future alcohol withdrawal symptoms, will initiate CIWA protocol with orders for the following medications:             -Vistaril 25 mg p.o. every 6 hours as needed for anxiety/agitation or CIWA score less than or equal to 10             -Imodium capsule 2 to 4 mg p.o. as needed for diarrhea or loose stools             -Ativan 1 mg p.o. every 6 hours as needed for CIWA score greater than 10             -Multivitamin with minerals tablet: 1 tablet p.o. daily for nutritional supplementation             -Zofran ODT 4 mg p.o. every 6 hours as needed for nausea/vomiting             -Thiamine tablet 100 mg p.o. daily for  nutritional supplementation"  Stay Summary: Patient presented to the [facility] on the evening of 01/19/2021 for symptoms of worsening depression, anxiety, SI with plan, alcohol abuse.  Patient was evaluated by TTS and this provider and inpatient psychiatric treatment was recommended by this provider upon evaluation. Patient was conditionally accepted to [facility] for inpatient psychiatric treatment pending negative PCR Flu A&B, COVID test result. PCR Flu A&B, COVID test,  as well as other labs/tests including blood work, urine specimen, and EKG were ordered (see interpretation of these labs/tests above or in this provider's 01/19/2021 [place] admission H&P note) and patient was admitted to [facility] continuous assessment for further crisis stabilization and treatment while waiting for PCR Flu A&B, COVID test results for inpatient psychiatric admission.  PCR Flu A&B, COVID has resulted negative.  Thus, patient is accepted to [facility] for inpatient psychiatric treatment.  Will transfer the patient to [facility].  Total Time spent with patient: 30 minutes  Past Psychiatric History: past psychiatric history significant for depression, anxiety, insomnia, and alcohol abuse.  Please see HPI for further details regarding patient's past psychiatric history.  Past Medical History:  Past Medical History:  Diagnosis Date   Asthma    History reviewed. No pertinent surgical history. Family History: History reviewed. No pertinent family history. Family Psychiatric History: None reported.  Social History:  Social History   Substance and Sexual Activity  Alcohol Use Yes   Comment: Occasionally     Social History   Substance and Sexual Activity  Drug Use Never    Social History   Socioeconomic History   Marital status: Married    Spouse name: Not on file   Number of children: Not on file   Years of education: Not on file   Highest education level: Not on file  Occupational History   Not on file  Tobacco Use   Smoking status: Never    Smokeless tobacco: Never  Substance and Sexual Activity   Alcohol use: Yes    Comment: Occasionally   Drug use: Never   Sexual activity: Not on file  Other Topics Concern   Not on file  Social History Narrative   Not on file   Social Determinants of Health   Financial Resource Strain: Not on file  Food Insecurity: Not on file  Transportation Needs: Not on file  Physical Activity: Not on file  Stress: Not on file  Social Connections: Not on file   SDOH:  SDOH Screenings   Alcohol Screen: Not on file  Depression (PHQ2-9): Not on file  Financial Resource Strain: Not on file  Food Insecurity: Not on file  Housing: Not on file  Physical Activity: Not on file  Social Connections: Not on file  Stress: Not on file  Tobacco Use: Low Risk    Smoking Tobacco Use: Never   Smokeless Tobacco Use: Never   Passive Exposure: Not on file  Transportation Needs: Not on file    Tobacco Cessation:  Prescription not provided because: Patient is being transferred to a facility where tobacco cessation products can be provided to the patient if necessary (signed and held order placed for patient to receive nicotine patch 14 mg p.o. daily at [facility] during his inpatient psychiatric stay).  Current Medications:  Current Facility-Administered Medications  Medication Dose Route Frequency Provider Last Rate Last Admin   acetaminophen (TYLENOL) tablet 650 mg  650 mg Oral Q6H PRN Prescilla Sours, PA-C       albuterol (VENTOLIN HFA) 108 (90 Base) MCG/ACT inhaler 1-2 puff  1-2 puff Inhalation Q6H PRN Prescilla Sours, PA-C       alum & mag hydroxide-simeth (MAALOX/MYLANTA) 200-200-20 MG/5ML suspension 30 mL  30 mL Oral Q4H PRN Margorie John W, PA-C       cephALEXin [facility]) capsule 500 mg  500 mg Oral Q6H Margorie John W, PA-C   500 mg at 01/20/21 0021   hydrOXYzine (ATARAX) tablet 25  mg  25 mg Oral Q6H PRN Prescilla Sours, PA-C       levothyroxine (SYNTHROID) tablet 175 mcg  175 mcg Oral QAC breakfast Margorie John W, PA-C       loperamide (IMODIUM) capsule 2-4 mg  2-4 mg Oral PRN Margorie John W, PA-C       LORazepam (ATIVAN) tablet 1 mg  1 mg Oral Q6H PRN Margorie John W, PA-C       magnesium hydroxide (MILK OF MAGNESIA) suspension 30 mL  30 mL Oral Daily PRN Margorie John W, PA-C       multivitamin with minerals tablet 1 tablet  1 tablet Oral Daily Lovena Le, Veda Arrellano W, PA-C       nicotine (NICODERM CQ - dosed in mg/24 hours) patch 14 mg  14 mg Transdermal Daily Lovena Le, Hyun Marsalis W, PA-C       ondansetron (ZOFRAN-ODT) disintegrating tablet 4 mg  4 mg Oral Q6H PRN Margorie John W, PA-C       sertraline (ZOLOFT) tablet 50 mg  50 mg Oral QHS Margorie John W, PA-C   50 mg at 01/19/21 2310   thiamine tablet 100 mg  100 mg Oral Daily Margorie John W, PA-C       traZODone (DESYREL) tablet 50 mg  50 mg Oral QHS PRN Prescilla Sours, PA-C       Current Outpatient Medications  Medication Sig Dispense Refill   albuterol (VENTOLIN HFA) 108 (90 Base) MCG/ACT inhaler INHALE ONE PUFF INTO THE LUNGS EVERY 6 (SIX) HOURS AS NEEDED FOR WHEEZING.     levothyroxine (SYNTHROID, LEVOTHROID) 175 MCG tablet Take 175 mcg by mouth daily before breakfast.     naproxen (NAPROSYN) 500 MG tablet Take 1 tablet (500 mg total) by mouth 2 (two) times daily with a meal. 30 tablet 0    PTA Medications: (Not in a hospital admission)   Musculoskeletal  Strength & Muscle Tone: within normal limits Gait & Station: normal Patient leans: N/A  Psychiatric Specialty Exam  Presentation  General Appearance: Appropriate for Environment; Disheveled  Eye Contact:Good  Speech:Clear and Coherent; Normal Rate  Speech Volume:Normal  Handedness:No data recorded  Mood and Affect  Mood:Anxious; Depressed  Affect:Congruent; Tearful   Thought Process  Thought Processes:Coherent; Goal Directed; Linear  Descriptions of Associations:Intact  Orientation:Full (Time, Place and Person)  Thought Content:Logical; WDL     Hallucinations:Hallucinations:  None  Ideas of Reference:-- (Patient reports he thinks he may be experiencing some paranoia, but is not entirely sure if he is experiencing paranoia or not (see HPI for details).)  Suicidal Thoughts:Suicidal Thoughts: Yes, Active (See HPI for details.) SI Active Intent and/or Plan: With Intent; With Plan; With Means to [place]; With Access to Means  Homicidal Thoughts:Homicidal Thoughts: No   Sensorium  Memory:Immediate Good; Recent Good; Remote Good  Judgment:Fair  Insight:Fair   Executive Functions  Concentration:Fair  Attention Span:Fair  [place]  Language:Good   Psychomotor Activity  Psychomotor Activity:Psychomotor Activity: Normal   Assets  Assets:Communication Skills; Desire for Improvement; Housing; Leisure Time; Physical Health; Resilience; Vocational/Educational   Sleep  Sleep:Sleep: Poor Number of Hours of Sleep: 6   Nutritional Assessment (For [facility] and [facility] admissions only) Has the patient had a weight loss or gain of 10 pounds or more in the last 3 months?: -- (Patient reports he has lost weight over the last few months, but is unsure of exact amount of weight loss.) Has the patient had a decrease in food  intake/or appetite?: Yes Does the patient have dental problems?: No Does the patient have eating habits or behaviors that may be indicators of an eating disorder including binging or inducing vomiting?: No Has the patient recently lost weight without trying?: 2.0 Has the patient been eating poorly because of a decreased appetite?: 1 Malnutrition Screening Tool Score: 3 Nutritional Assessment Referrals: Refer to Primary Care Provider   Physical Exam Vitals reviewed.  Constitutional:      General: He is not in acute distress.    Appearance: He is not ill-appearing, toxic-appearing or diaphoretic.  HENT:     Head: Normocephalic and atraumatic.     Right Ear: External ear normal.     Left Ear: External ear normal.      Nose: Nose normal.  Eyes:     General: No scleral icterus.       Right eye: No discharge.        Left eye: No discharge.     Conjunctiva/sclera: Conjunctivae normal.     Comments: Patient wears glasses.   Cardiovascular:     Comments: Heart regular rate and rhythm. No murmurs, rubs, or gallops noted. Per nursing staff, dorsalis pedis and posterior tibial pulses fully intact bilaterally. Pulmonary:     Effort: Pulmonary effort is normal. No respiratory distress.     Comments: Lungs CTA in bilateral anterior and posterior lung fields.  Musculoskeletal:        General: Normal range of motion.     Cervical back: Normal range of motion.  Skin:    Comments: Approximately 8 cm x 6 cm area of poorly demarcated, blanching erythema noted on patient's left lower anterior shin that is not warm, cold or tender to palpation, with multiple small red/orange scab-like lesions dispersed throughout the erythema (patient reports that these lesions are due to him scratching the area), and without any signs of fluctuance, swelling, ulceration, or drainage/discharge noted.  No dermatologic abnormalities noted of patient's right lower extremity at this time.  Neurological:     General: No focal deficit present.     Mental Status: He is alert and oriented to person, place, and time.     Comments: No tremor noted.  Psychiatric:        Attention and Perception: Attention and perception normal. He does not perceive auditory or visual hallucinations.        Mood and Affect: Mood is anxious and depressed.        Speech: Speech normal.        Behavior: Behavior is not agitated, slowed, aggressive, hyperactive or combative. Behavior is cooperative.        Thought Content: Thought content is not delusional. Thought content includes suicidal ideation. Thought content does not include homicidal ideation. Thought content includes suicidal plan.     Comments: Affect mood-congruent and tearful. Patient reports he thinks he  may be experiencing some paranoia, but is not entirely sure if he is experiencing paranoia or not (see HPI for details).     Review of Systems  Constitutional:  Positive for malaise/fatigue and weight loss. Negative for chills, diaphoresis and fever.  HENT:  Negative for congestion.   Respiratory:  Negative for cough and shortness of breath.   Cardiovascular:  Negative for chest pain and palpitations.       + for intermittent chest tightness.   Gastrointestinal:  Positive for nausea and vomiting. Negative for abdominal pain, constipation and diarrhea.  Musculoskeletal:  Negative for joint pain and myalgias.  Skin:  Positive for rash.  Neurological:  Negative for dizziness, seizures and headaches.       + for chronic intermittent generalized tremor.  Psychiatric/Behavioral:  Positive for depression, substance abuse and suicidal ideas. Negative for hallucinations and memory loss. The patient is nervous/anxious and has insomnia.   All other systems reviewed and are negative. Patient also endorses history of experiencing chronic daily intermittent nonradiating left-sided chest tightness since September 2022.  Patient reports that this chest tightness will wax and wane and he can go periods of time throughout the day without experiencing it at all and sometimes he will randomly experience that during the day.  He denies history of MI or any other cardiac conditions.  Patient does endorse history of chronic intermittent shortness of breath which he reports is secondary to his history of asthma.  Patient also endorses history of intermittent nausea and vomiting since September 2022.  He reports that the episodes of emesis have been occurring a few times per month.  He reports that his last episode of emesis was a few days ago.  He reports that his previous episodes of emesis have been clear in color.  He denies hematemesis. Patient does also report history of intermittent rash/redness and itching of his  anterior left lower shin area that "comes and goes" a few times per month over the past 1 year.  He reports that this skin complaint he has not been evaluated by a provider before.  Patient also endorses chronic history of intermittent generalized tremor. He denies recent headache, lightheadedness, dizziness, vision changes, chest pain, nausea, abdominal pain, constipation, diarrhea, fever, diaphoresis, or additional pain. He denies headache, lightheadedness, dizziness, vision changes, chest pain, shortness of breath, nausea, abdominal pain, constipation, diarrhea, fever, chills, diaphoresis, any additional pain, or any additional physical symptoms on exam at this time. Patient does endorse intermittent feelings of feeling generally cold.     Vitals: Blood pressure 116/88, pulse 85, temperature 98.2 F (36.8 C), resp. rate 18, SpO2 98 %. There is no height or weight on file to calculate BMI.  Disposition: Will transfer the patient to [facility] to begin inpatient psychiatric treatment.  EMTALA form completed. [facility] signed and held admission orders placed, which contain orders for the medications documented above under the "Subjective" section of this note.  Prescilla Sours, PA-C 01/20/2021, 4:21 AM

## 2021-01-20 NOTE — Progress Notes (Signed)
°   01/20/21 2103  Psych Admission Type (Psych Patients Only)  Admission Status Voluntary  Psychosocial Assessment  Patient Complaints Anxiety;Worrying;Depression  Eye Contact Fair  Facial Expression Anxious;Worried  Affect Anxious;Appropriate to circumstance  Speech Logical/coherent  Interaction Assertive  Motor Activity Slow  Appearance/Hygiene Unremarkable  Behavior Characteristics Appropriate to situation;Cooperative  Mood Depressed;Anxious  Thought Process  Coherency Concrete thinking  Content Blaming others;Blaming self  Delusions None reported or observed  Perception WDL  Hallucination None reported or observed  Judgment Poor  Confusion None  Danger to Self  Current suicidal ideation? Denies  Danger to Others  Danger to Others None reported or observed
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
## 2021-01-20 NOTE — Tx Team (Signed)
Initial Treatment Plan 01/20/2021 6:23 AM [name] KGU:[id number]    PATIENT STRESSORS: Financial difficulties   Legal issue   Marital or family conflict   Substance abuse     PATIENT STRENGTHS: Average or above average intelligence  Capable of independent living  Motivation for treatment/growth  Work skills    PATIENT IDENTIFIED PROBLEMS: Suicidal ideation (walk into traffic)  Alcohol use d/o  Tobacco use d/o  Depression  Paranoia   ("Pt wants to get back on his meds and stop drinking alcohol")           DISCHARGE CRITERIA:  Adequate post-discharge living arrangements Improved stabilization in mood, thinking, and/or behavior Motivation to continue treatment in a less acute level of care Verbal commitment to aftercare and medication compliance  PRELIMINARY DISCHARGE PLAN: Attend aftercare/continuing care group Attend PHP/IOP Return to previous living arrangement Return to previous work or school arrangements  PATIENT/FAMILY INVOLVEMENT: This treatment plan has been presented to and reviewed with the patient, Danny West, and/or family member.  The patient and family have been given the opportunity to ask questions and make suggestions.  Victorino December, RN 01/20/2021, 6:23 AM
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
## 2021-01-20 NOTE — [facility] Suicide Risk Assessment (Signed)
[facility] INPATIENT:  Family/Significant Other Suicide Prevention Education  Suicide Prevention Education:  Patient Refusal for Family/Significant Other Suicide Prevention Education: The patient Danny West has refused to provide written consent for family/significant other to be provided Family/Significant Other Suicide Prevention Education during admission and/or prior to discharge.  Physician notified.  Danny West 01/20/2021, 3:23 PM

## 2021-01-20 NOTE — ED Notes (Signed)
Abnormal labs discussed with provider and report called to Marta Antu [facility] room 407-1 bed is a ready bed

## 2021-01-21 ENCOUNTER — Encounter (HOSPITAL_COMMUNITY): Payer: Self-pay

## 2021-01-21 ENCOUNTER — Telehealth (HOSPITAL_COMMUNITY): Payer: Self-pay | Admitting: Professional

## 2021-01-21 LAB — T3, FREE: T3, Free: 1.5 pg/mL — ABNORMAL LOW (ref 2.0–4.4)

## 2021-01-21 NOTE — Progress Notes (Signed)
Adult Psychoeducational Group Note  Date:  01/21/2021 Time:  12:34 AM  Group Topic/Focus:  Wrap-Up Group:   The focus of this group is to help patients review their daily goal of treatment and discuss progress on daily workbooks.  Participation Level:  Active  Participation Quality:  Appropriate  Affect:  Appropriate  Cognitive:  Appropriate  Insight: Appropriate  Engagement in Group:  Engaged  Modes of Intervention:  Discussion  Additional Comments:  Patient said his day was 4. His goal not to have a breakdown he achieved his goal a little. His coping skills pacing  Lenice Llamas Long 01/21/2021, 12:34 AM

## 2021-01-21 NOTE — BH IP Treatment Plan (Unsigned)
Interdisciplinary Treatment and Diagnostic Plan Update  01/21/2021 Time of Session: 9:20am  Danny West MRN: [id number]  Principal Diagnosis: MDD (major depressive disorder), recurrent severe, without psychosis ([place])  Secondary Diagnoses: Principal Problem:   MDD (major depressive disorder), recurrent severe, without psychosis ([place]) Active Problems:   Cellulitis of left lower extremity   Current Medications:  Current Facility-Administered Medications  Medication Dose Route Frequency Provider Last Rate Last Admin   acetaminophen (TYLENOL) tablet 650 mg  650 mg Oral Q6H PRN Prescilla Sours, PA-C       albuterol (VENTOLIN HFA) 108 (90 Base) MCG/ACT inhaler 1-2 puff  1-2 puff Inhalation Q6H PRN Prescilla Sours, PA-C       alum & mag hydroxide-simeth (MAALOX/MYLANTA) 200-200-20 MG/5ML suspension 30 mL  30 mL Oral Q4H PRN Margorie John W, PA-C       cephALEXin (KEFLEX) capsule 500 mg  500 mg Oral Q6H Lovena Le, Cody W, PA-C   500 mg at 01/21/21 1305   hydrOXYzine (ATARAX) tablet 25 mg  25 mg Oral Q6H PRN Margorie John W, PA-C   25 mg at 01/21/21 1308   liothyronine (CYTOMEL) tablet 25 mcg  25 mcg Oral Daily Corky Sox, MD   25 mcg at 01/21/21 7253   loperamide (IMODIUM) capsule 2-4 mg  2-4 mg Oral PRN Margorie John W, PA-C       LORazepam (ATIVAN) tablet 1 mg  1 mg Oral Q6H PRN Margorie John W, PA-C       magnesium hydroxide (MILK OF MAGNESIA) suspension 30 mL  30 mL Oral Daily PRN Margorie John W, PA-C       multivitamin with minerals tablet 1 tablet  1 tablet Oral Daily Margorie John W, PA-C   1 tablet at 01/21/21 0953   nicotine (NICODERM CQ - dosed in mg/24 hours) patch 14 mg  14 mg Transdermal Daily Margorie John W, PA-C   14 mg at 01/21/21 0953   ondansetron (ZOFRAN-ODT) disintegrating tablet 4 mg  4 mg Oral Q6H PRN Margorie John W, PA-C       sertraline (ZOLOFT) tablet 50 mg  50 mg Oral QHS Margorie John W, PA-C   50 mg at 01/20/21 2103   thiamine tablet 100 mg  100 mg Oral Daily Margorie John  W, PA-C   100 mg at 01/21/21 6644   traZODone (DESYREL) tablet 50 mg  50 mg Oral QHS PRN Prescilla Sours, PA-C   50 mg at 01/20/21 2103   PTA Medications: Medications Prior to Admission  Medication Sig Dispense Refill Last Dose   albuterol (VENTOLIN HFA) 108 (90 Base) MCG/ACT inhaler 2 puffs every 6 (six) hours as needed for wheezing or shortness of breath.   Past Week   fluticasone furoate-vilanterol (BREO ELLIPTA) 100-25 MCG/ACT AEPB Inhale 1 puff into the lungs daily as needed (For shortness of breath).   long time ago   ibuprofen (ADVIL) 200 MG tablet Take 400 mg by mouth every 6 (six) hours as needed for headache.   01/19/2021   levothyroxine (SYNTHROID, LEVOTHROID) 175 MCG tablet Take 175 mcg by mouth daily before breakfast.   01/18/2021    Patient Stressors: Financial difficulties   Legal issue   Marital or family conflict   Substance abuse    Patient Strengths: Average or above average intelligence  Capable of independent living  Motivation for treatment/growth  Work skills   Treatment Modalities: Medication Management, Group therapy, Case management,  1 to 1 session with clinician, Psychoeducation, Recreational therapy.  Physician Treatment Plan for Primary Diagnosis: MDD (major depressive disorder), recurrent severe, without psychosis ([place]) Long Term Goal(s): Improvement in symptoms so as ready for discharge   Short Term Goals: Ability to identify changes in lifestyle to reduce recurrence of condition will improve Ability to verbalize feelings will improve Ability to disclose and discuss suicidal ideas Ability to demonstrate self-control will improve Ability to identify and develop effective coping behaviors will improve  Medication Management: Evaluate patient's response, side effects, and tolerance of medication regimen.  Therapeutic Interventions: 1 to 1 sessions, Unit Group sessions and Medication administration.  Evaluation of Outcomes: Not Met  Physician Treatment  Plan for Secondary Diagnosis: Principal Problem:   MDD (major depressive disorder), recurrent severe, without psychosis ([place]) Active Problems:   Cellulitis of left lower extremity  Long Term Goal(s): Improvement in symptoms so as ready for discharge   Short Term Goals: Ability to identify changes in lifestyle to reduce recurrence of condition will improve Ability to verbalize feelings will improve Ability to disclose and discuss suicidal ideas Ability to demonstrate self-control will improve Ability to identify and develop effective coping behaviors will improve     Medication Management: Evaluate patient's response, side effects, and tolerance of medication regimen.  Therapeutic Interventions: 1 to 1 sessions, Unit Group sessions and Medication administration.  Evaluation of Outcomes: Not Met   RN Treatment Plan for Primary Diagnosis: MDD (major depressive disorder), recurrent severe, without psychosis ([place]) Long Term Goal(s): Knowledge of disease and therapeutic regimen to maintain health will improve  Short Term Goals: Ability to remain free from injury will improve, Ability to participate in decision making will improve, Ability to verbalize feelings will improve, Ability to disclose and discuss suicidal ideas, and Ability to identify and develop effective coping behaviors will improve  Medication Management: RN will administer medications as ordered by provider, will assess and evaluate patient's response and provide education to patient for prescribed medication. RN will report any adverse and/or side effects to prescribing provider.  Therapeutic Interventions: 1 on 1 counseling sessions, Psychoeducation, Medication administration, Evaluate responses to treatment, Monitor vital signs and CBGs as ordered, Perform/monitor CIWA, COWS, AIMS and Fall Risk screenings as ordered, Perform wound care treatments as ordered.  Evaluation of Outcomes: Not Met   LCSW Treatment Plan for  Primary Diagnosis: MDD (major depressive disorder), recurrent severe, without psychosis ([place]) Long Term Goal(s): Safe transition to appropriate next level of care at discharge, Engage patient in therapeutic group addressing interpersonal concerns.  Short Term Goals: Engage patient in aftercare planning with referrals and resources, Increase social support, Increase emotional regulation, Facilitate acceptance of mental health diagnosis and concerns, Identify triggers associated with mental health/substance abuse issues, and Increase skills for wellness and recovery  Therapeutic Interventions: Assess for all discharge needs, 1 to 1 time with Social worker, Explore available resources and support systems, Assess for adequacy in community support network, Educate family and significant other(s) on suicide prevention, Complete Psychosocial Assessment, Interpersonal group therapy.  Evaluation of Outcomes: Not Met   Progress in Treatment: Attending groups: Yes. Participating in groups: Yes. Taking medication as prescribed: Yes. Toleration medication: Yes. Family/Significant other contact made: No, will contact:  [place]  Patient understands diagnosis: Yes. Discussing patient identified problems/goals with staff: Yes. Medical problems stabilized or resolved: Yes. Denies suicidal/homicidal ideation: Yes. Issues/concerns per patient self-inventory: No.   New problem(s) identified: No, Describe:  None   New Short Term/Long Term Goal(s): medication stabilization, elimination of SI thoughts, development of comprehensive mental wellness plan.  Patient Goals: "To find out why my Anxiety is so bad and to get a handle on my substance use"   Discharge Plan or Barriers: Patient recently admitted. CSW will continue to follow and assess for appropriate referrals and possible discharge planning.   Reason for Continuation of Hospitalization: Anxiety Depression Homicidal ideation Medication  stabilization Suicidal ideation  Estimated Length of Stay: 3 to 5 days    Scribe for Treatment Team: Darleen Crocker, Latanya Presser 01/21/2021 2:35 PM

## 2021-01-21 NOTE — Group Note (Signed)
Recreation Therapy Group Note   Group Topic:Stress Management  Group Date: 01/21/2021 Start Time: 0932 End Time: 0945 Facilitators: Caroll Rancher, [place] Location: [address] Dayroom   Goal Area(s) Addresses:  Patient will identify positive stress management techniques. Patient will identify benefits of using stress management post d/c.  Group Description: Meditation.  LRT played a meditation on having gratitude.  The meditation spoke of being grateful not for physical things but for experiences that have been overcome, people and the simple things such as life and having a place to live.  Patients were to listen and follow as meditation played to fully engage.   Affect/Mood: N/A   Participation Level: Did not attend    Clinical Observations/Individualized Feedback:     Plan: Continue to engage patient in RT group sessions 2-3x/week.   Caroll Rancher, Antonietta Jewel 01/21/2021 12:55 PM

## 2021-01-21 NOTE — Progress Notes (Signed)
[facility] MD Progress Note  01/21/2021 7:38 PM Danny West  MRN:  [id number] Subjective:   Danny West is a 46 year old male with a self-reported history of ADHD and depression, presenting as a voluntary walk-in to the behavioral health urgent care for "I am having a mental breakdown". He was admitted voluntarily to the behavioral health hospital for suicidal thoughts with a plan to jump in front of a train.  The patient's chart was reviewed and nursing notes were reviewed. Over the past 24 hrs, there were no documented behavioral issues, no PRN medications given for agitation, and the patient was compliant with scheduled medications. The patient's case was discussed in multidisciplinary team meeting.   On interview and assessment this morning, the patient is linear and logical with a depressed affect.  Patient reports that he feels groggy this morning, which he feels is due to taking trazodone last night.  When asked how his mood is, he replies "not happy".  He reports cravings for chewing tobacco.  When asked about life outside the hospital, the patient focuses on the fact that his home is "depressing".  He says that he feels alone at home and is frustrated that he believes there is a meth lab below his apartment.  He does report on a positive note that his work life is good.  He feels like he has a "family" there. The patient denies auditory/visual hallucinations and first rank symptoms.  The patient reports good appetite, and sleep. They deny suicidal and homicidal thoughts. The patient denies side effects from his Zoloft.  Review of systems as below.  Of note, the patient reports that he has never experienced alcohol withdrawal before and explicitly denies history of alcohol withdrawal seizures and hallucinations or tremor.    Principal Problem: MDD (major depressive disorder), recurrent severe, without psychosis ([place]) Diagnosis: Principal Problem:   MDD (major depressive disorder), recurrent severe,  without psychosis ([place]) Active Problems:   Cellulitis of left lower extremity  Total Time Spent in Direct Patient Care: I personally spent 30 minutes on the unit in direct patient care. The direct patient care time included face-to-face time with the patient, reviewing the patient's chart, communicating with other professionals, and coordinating care. Greater than 50% of this time was spent in counseling or coordinating care with the patient regarding goals of hospitalization, psycho-education, and discharge planning needs.   Past Psychiatric Hx: Previous Psych Diagnoses: ADHD, depression Prior inpatient treatment: none Current/prior outpatient treatment: sees a therapist Prior rehab hx: none Psychotherapy hx: Psychiatric medication history: Prozac in 1998, and Zoloft from 2019-January of 2022. Current Psychiatrist: none Current therapist: "Lynann Bologna"  Past Medical History:  Past Medical History:  Diagnosis Date   Asthma     Past Surgical History:  Procedure Laterality Date   NO PAST SURGERIES     Family History: History reviewed. No pertinent family history. Family Psychiatric  History: alcohol abuse in mother and brother, bipolar in father, no Hx of suicide Social History:  Social History   Substance and Sexual Activity  Alcohol Use Yes   Comment: drinks (4) 16 oz beers daily     Social History   Substance and Sexual Activity  Drug Use Never    Social History   Socioeconomic History   Marital status: Divorced    Spouse name: Not on file   Number of children: 2   Years of education: 12   Highest education level: Not on file  Occupational History   Not on file  Tobacco Use  Smoking status: Never   Smokeless tobacco: Current    Types: Snuff  Vaping Use   Vaping Use: Never used  Substance and Sexual Activity   Alcohol use: Yes    Comment: drinks (4) 16 oz beers daily   Drug use: Never   Sexual activity: Not on file  Other Topics Concern   Not on file  Social  History Narrative   Pt lives alone and works at [employer]. Divorced 11/2019; twin boys are 48 years old   Social Determinants of Radio broadcast assistant Strain: Not on file  Food Insecurity: Not on file  Transportation Needs: Not on file  Physical Activity: Not on file  Stress: Not on file  Social Connections: Not on file   Additional Social History:   NA      Sleep: Fair  Appetite:  Fair  Current Medications: Current Facility-Administered Medications  Medication Dose Route Frequency Provider Last Rate Last Admin   acetaminophen (TYLENOL) tablet 650 mg  650 mg Oral Q6H PRN Prescilla Sours, PA-C       albuterol (VENTOLIN HFA) 108 (90 Base) MCG/ACT inhaler 1-2 puff  1-2 puff Inhalation Q6H PRN Prescilla Sours, PA-C       alum & mag hydroxide-simeth (MAALOX/MYLANTA) 200-200-20 MG/5ML suspension 30 mL  30 mL Oral Q4H PRN Margorie John W, PA-C       cephALEXin (KEFLEX) capsule 500 mg  500 mg Oral Q6H Lovena Le, Cody W, PA-C   500 mg at 01/21/21 1702   hydrOXYzine (ATARAX) tablet 25 mg  25 mg Oral Q6H PRN Prescilla Sours, PA-C   25 mg at 01/21/21 1308   liothyronine (CYTOMEL) tablet 25 mcg  25 mcg Oral Daily Corky Sox, MD   25 mcg at 01/21/21 [id number]   loperamide (IMODIUM) capsule 2-4 mg  2-4 mg Oral PRN Margorie John W, PA-C       LORazepam (ATIVAN) tablet 1 mg  1 mg Oral Q6H PRN Margorie John W, PA-C       magnesium hydroxide (MILK OF MAGNESIA) suspension 30 mL  30 mL Oral Daily PRN Margorie John W, PA-C       multivitamin with minerals tablet 1 tablet  1 tablet Oral Daily Margorie John W, PA-C   1 tablet at 01/21/21 0953   nicotine (NICODERM CQ - dosed in mg/24 hours) patch 14 mg  14 mg Transdermal Daily Margorie John W, PA-C   14 mg at 01/21/21 0953   ondansetron (ZOFRAN-ODT) disintegrating tablet 4 mg  4 mg Oral Q6H PRN Margorie John W, PA-C       sertraline (ZOLOFT) tablet 50 mg  50 mg Oral QHS Margorie John W, PA-C   50 mg at 01/20/21 2103   thiamine tablet 100 mg  100 mg Oral Daily Margorie John W, PA-C   100 mg at 01/21/21 [id number]   traZODone (DESYREL) tablet 50 mg  50 mg Oral QHS PRN Prescilla Sours, PA-C   50 mg at 01/20/21 2103    Lab Results:  Results for orders placed or performed during the hospital encounter of 01/20/21 (from the past 48 hour(s))  Lipase, blood     Status: None   Collection Time: 01/20/21  6:14 PM  Result Value Ref Range   Lipase 39 11 - 51 U/L    Comment: Performed at [facility], [place] [address], [place], [place] [address]  Hepatic function panel     Status: Abnormal   Collection Time: 01/20/21  6:14 PM  Result Value Ref Range   Total Protein 7.5 6.5 - 8.1 g/dL   Albumin 4.1 3.5 - 5.0 g/dL   AST 54 (H) 15 - 41 U/L   ALT 54 (H) 0 - 44 U/L   Alkaline Phosphatase 192 (H) 38 - 126 U/L   Total Bilirubin 1.4 (H) 0.3 - 1.2 mg/dL   Bilirubin, Direct 0.3 (H) 0.0 - 0.2 mg/dL   Indirect Bilirubin 1.1 (H) 0.3 - 0.9 mg/dL    Comment: Performed at [facility], [place] [address] [place], [place] [address]  Protime-INR     Status: None   Collection Time: 01/20/21  6:14 PM  Result Value Ref Range   Prothrombin Time 13.3 11.4 - 15.2 seconds   INR 1.0 0.8 - 1.2    Comment: (NOTE) INR goal varies based on device and disease states. Performed at [facility], [place] [address] [place], [place] [address]     Blood Alcohol level:  Lab Results  Component Value Date   ETH 11 (H) 123456    Metabolic Disorder Labs: Lab Results  Component Value Date   HGBA1C 4.3 (L) 01/19/2021   MPG 76.71 01/19/2021   No results found for: PROLACTIN Lab Results  Component Value Date   CHOL 354 (H) 01/19/2021   TRIG 941 (H) 01/19/2021   HDL 59 01/19/2021   CHOLHDL 6.0 01/19/2021   VLDL UNABLE TO CALCULATE IF TRIGLYCERIDE OVER 400 mg/dL 01/19/2021   LDLCALC UNABLE TO CALCULATE IF TRIGLYCERIDE OVER 400 mg/dL 01/19/2021    Physical Findings: CIWA:  CIWA-Ar Total: 1  Musculoskeletal: Strength & Muscle Tone:  within normal limits Gait & Station: normal Patient leans: N/A  Psychiatric Specialty Exam:  Presentation  General Appearance: Appropriate for Environment; Disheveled  Eye Contact:Good  Speech:Clear and Coherent; Normal Rate  Speech Volume:Normal  Handedness:No data recorded  Mood and Affect  Mood:Anxious; Depressed  Affect: congruent  Thought Process  Thought Processes:Coherent; Goal Directed; Linear  Descriptions of Associations:Intact  Orientation:Full (Time, Place and Person)  Thought Content:Logical; WDL Denies SI/HI/AVH.  History of Schizophrenia/Schizoaffective disorder: NA Duration of Psychotic Symptoms: NA Hallucinations: NA Ideas of Reference: none Suicidal Thoughts: denies Homicidal Thoughts: denies  Sensorium  Memory:Immediate Good; Recent Good; Remote Good  Judgment:Fair  Insight:Fair   Executive Functions  Concentration:Fair  Attention Span:Fair  [place] of Knowledge:Good  Language:Good   Psychomotor Activity  Psychomotor Activity: normal Assets  Assets:Communication Skills; Desire for Improvement; Housing; Leisure Time; Physical Health; Resilience; Vocational/Educational   Sleep  Sleep: fair  Physical Exam: Physical Exam Vitals reviewed.  Constitutional:      Appearance: He is not toxic-appearing.  Pulmonary:     Effort: Pulmonary effort is normal.  Neurological:     Mental Status: He is alert and oriented to person, place, and time.   Review of Systems  Respiratory:  Negative for shortness of breath.   Cardiovascular:  Negative for chest pain.  Gastrointestinal:  Positive for constipation. Negative for nausea and vomiting.  Blood pressure 112/71, pulse 80, temperature 98.6 F (37 C), temperature source Oral, resp. rate 18, height 5\' 7"  (1.702 m), weight 90.4 kg, SpO2 100 %. Body mass index is 31.23 kg/m.   Treatment Plan Summary: Daily contact with patient to assess and evaluate symptoms and progress in  treatment and Medication management  Safety and Monitoring -- VOLUNTARY admission to inpatient psychiatric unit for safety, stabilization and treatment -- Daily contact with patient to assess and evaluate symptoms and  progress in treatment -- Patient's case to be discussed in multi-disciplinary team meeting -- Observation Level : q15 minute checks -- Vital signs:  q12 hours -- Precautions: suicide   Diagnoses: MDD, recurrent, severe Alcohol use disorder Hypothyroidism Asthma   MDD, recurrent, severe Severe symptoms as documented above. There may be some contribution from his hypothyroidism.  -Continue Zoloft 50 mg (started 1/9) for depressive symptoms, patient last took this medicine in January of 2022 and reports good effect then.  -Consider addition of Remeron for sleep -Add cytomel 25 mcg (discontinue Synthroid) for hypothyroidism and adjunct of Zoloft -Supportive psychotherapy along with cognitive approaches -Consider PHP on discharge   Alcohol use disorder -Encourage reduction in use or cessation -CIWA q6hrs for 48 hrs -Ativan 1 mg PRN CIWA >10   Continue PRN's: Tylenol, Maalox, Atarax, Milk of Magnesia, Trazodone   Medical Management Covid negative CMP: AST/ALT of 82/68, AP of 227 , repeat of AST/ALT of 54/54, AP down to 192, lipase of 39, expect continued decrease in abnormal labs.  CBC: unremarkable EtOH: 11 UDS: negative TSH: 104 (see below); T4 of 0.4, T3 pending A1C: not obtained Lipids: obtained, non-fasting, may be unreliable EKG: not obtained, now ordered   Hypothyroidism Patient has not taken a consistent regimen since at least one year ago. He was started on Synthroid, but this is reportedly his sister's medication that he has been taking for the past 2 weeks. TSH: 104; T4 of 0.4, T3 pending. -Start cytomel 25 mcg -Recheck levels as an outpatient   Asthma -Continue albuterol inhaler   Likely Cellulitis  -Continue Keflex for a 5 day course -Monitor  response to ABX  Corky Sox, MD PGY-1

## 2021-01-21 NOTE — Progress Notes (Signed)
Nutrition Brief Note  Patient identified on the Malnutrition Screening Tool (MST) Report  No weight loss noted in records. Pt with elevated total cholesterol and TG levels. Supplements not warranted.  Wt Readings from Last 15 Encounters:  01/20/21 90.4 kg  10/02/20 85.5 kg  06/09/16 85.5 kg    Body mass index is 31.23 kg/m. Patient meets criteria for obesity based on current BMI.   Current diet order is heart healthy. Labs and medications reviewed.   No nutrition interventions warranted at this time. If nutrition issues arise, please consult RD.   Clayton Bibles, MS, RD, LDN Inpatient Clinical Dietitian Contact information available via Amion

## 2021-01-21 NOTE — Progress Notes (Signed)
D- Patient alert and oriented x4. Patient in stable mood.  Denies SI, HI, AVH. Pt interacting positively with staff and peers. Pt denied pain but stated the antibiotics he was receiving made his stomach upset.    A- Scheduled medications administered to patient, per MD orders.Routine safety checks conducted every 15 minutes.  Pt encouraged to eat meals before takings antibiotics. Educated on importance to complete regimen. Patient informed to notify staff with problems or concerns.   R- Patient compliant with medications and verbalized understanding treatment plan. Patient receptive, calm, and cooperative.    01/21/21 2135  Psych Admission Type (Psych Patients Only)  Admission Status Voluntary  Psychosocial Assessment  Patient Complaints Anxiety  Eye Contact Fair  Facial Expression Anxious  Affect Appropriate to circumstance  Speech Logical/coherent  Interaction Assertive  Motor Activity Other (Comment) (WNL)  Appearance/Hygiene Unremarkable  Behavior Characteristics Appropriate to situation;Cooperative  Mood Anxious;Pleasant  Thought Administrator, sports thinking  Content Blaming others;Blaming self  Delusions None reported or observed  Perception WDL  Hallucination None reported or observed  Judgment Impaired  Confusion None  Danger to Self  Current suicidal ideation? Denies  Danger to Others  Danger to Others None reported or observed

## 2021-01-21 NOTE — Group Note (Unsigned)
Date:  01/21/2021 °Time:  9:34 AM ° °Group Topic/Focus:  °Orientation:   The focus of this group is to educate the patient on the purpose and policies of crisis stabilization and provide a format to answer questions about their admission.  The group details unit policies and expectations of patients while admitted. ° ° ° ° °Participation Level:  {[facility] PARTICIPATION LEVEL:22264} ° °Participation Quality:  {[facility] PARTICIPATION QUALITY:22265} ° °Affect:  {[facility] AFFECT:22266} ° °Cognitive:  {[facility] COGNITIVE:22267} ° °Insight: {[facility] Insight2:20797} ° °Engagement in Group:  {[facility] ENGAGEMENT IN GROUP:22268} ° °Modes of Intervention:  {[facility] MODES OF INTERVENTION:22269} ° °Additional Comments:  *** ° °Elka Satterfield Lashawn Tavaughn Silguero °01/21/2021, 9:34 AM ° °

## 2021-01-21 NOTE — Group Note (Signed)
Date:  01/21/2021 Time:  9:38 AM  Group Topic/Focus:  Orientation:   The focus of this group is to educate the patient on the purpose and policies of crisis stabilization and provide a format to answer questions about their admission.  The group details unit policies and expectations of patients while admitted.    Participation Level:  Did not attend  Participation Quality:    Affect:    Cognitive:    Insight:   Engagement in Group:    Modes of Intervention:    Additional Comments:  Pt did not attend group.  Garvin Fila 01/21/2021, 9:38 AM

## 2021-01-21 NOTE — Progress Notes (Signed)
A & O X 4. Denies SI, HI, AVH and pain when assessed. Reports continued anxiety and preoccupation related to finances, family dynamics and events that led to admission. Per pt "I wish all this will just go away,swear my my neighbors were cooking meth in their apartment. I got really high and I don't even use drugs no more because of my probation; not even weed which I really like. My ex-wife is just punishing me". Reports he slept well last night with good appetite. Attended scheduled groups. Rates his depression 7 and anxiety 10/10 "I just received a bad phone call from my ex-wife, I just wish I can go back home and make all this go away right now. She will not let me". Received PRN Vistaril 25 mg PO at 1308 as ordered ad report relief when reassessed. Pt presented with flat affect initially but brightened up and became more engaged in activities as shift progressed. Attended scheduled groups, off unit activities without issues. Support and reassurance provided to pt. Safety checks maintained at Q 15 minutes intervals. All medications administered with verbal education and effects monitored. Pt encouraged to voice concerns. Pt tolerates meals, medications and fluids well. Remains safe on and off unit.

## 2021-01-21 NOTE — Group Note (Signed)
LCSW Group Therapy Note ° °Group Date: 01/21/2021 °Start Time: 1300 °End Time: 1400 ° ° °Type of Therapy and Topic:  Group Therapy: Self-Harm Alternatives ° °Participation Level:  Active ° ° °Description of Group:   °Patients participated in a discussion regarding non-suicidal self-injurious behavior (NSSIB, or self-harm) and the stigma surrounding it. There was also discussion surrounding how other maladaptive coping skills could be seen as self-harm, such as substance abuse. Participants were invited to share their experiences with self-harm, with emphasis being placed on the motivation for self-harm (such as release, punishment, feeling numb, etc). Patients were then asked to brainstorm potential substitutions for self-harm and were provided with a handout entitled, "Distraction Techniques and Alternative Coping Strategies," published by [employer] Self-Injury Recovery. ° °Therapeutic Goals: ° °Patients will be given the opportunity to discuss NSSIB in a non-judgmental and therapeutic environment. °Patients will identify which feelings lead to NSSIB.  °Patients will discuss potential healthy coping skills to replace NSSIB °Open discussion will specifically address stigma and shame surrounding NSSIB.  ° °Summary of Patient Progress:  Pt was present/active throughout the session and proved open to feedback from CSW and peers. Patient demonstrated insight into the subject matter, was respectful of peers, and was present throughout the entire session. ° °Therapeutic Modalities:   °Cognitive Behavioral Therapy ° ° °Jakub Debold M Omara Alcon, LCSWA °01/21/2021  1:43 PM   °

## 2021-01-22 LAB — LIPID PANEL
Cholesterol: 226 mg/dL — ABNORMAL HIGH (ref 0–200)
HDL: 50 mg/dL (ref >40)
LDL Cholesterol: 134 mg/dL — ABNORMAL HIGH (ref 0–99)
Total CHOL/HDL Ratio: 4.5 RATIO
Triglycerides: 209 mg/dL — ABNORMAL HIGH (ref <150)
VLDL: 42 mg/dL — ABNORMAL HIGH (ref 0–40)

## 2021-01-22 MED ORDER — HYDROXYZINE HCL 25 MG PO TABS
25.0000 mg | ORAL_TABLET | Freq: Three times a day (TID) | ORAL | Status: DC | PRN
  Administered 2021-01-22: 25 mg via ORAL
  Filled 2021-01-22: qty 1
  Filled 2021-01-22: qty 10

## 2021-01-22 NOTE — Progress Notes (Signed)
°   01/22/21 2117  Psych Admission Type (Psych Patients Only)  Admission Status Voluntary  Psychosocial Assessment  Patient Complaints Anxiety  Eye Contact Fair  Facial Expression Anxious  Affect Appropriate to circumstance  Speech Logical/coherent  Interaction Assertive  Motor Activity Other (Comment) (WDL)  Appearance/Hygiene Unremarkable  Behavior Characteristics Cooperative;Appropriate to situation  Mood Depressed;Anxious  Thought Process  Coherency Concrete thinking  Content Blaming self;Blaming others  Delusions None reported or observed  Perception WDL  Hallucination None reported or observed  Judgment Impaired  Confusion None  Danger to Self  Current suicidal ideation? Denies  Danger to Others  Danger to Others None reported or observed

## 2021-01-22 NOTE — Hospital Course (Signed)
HOSPITAL COURSE:  During the patient's hospitalization, patient had extensive initial psychiatric evaluation, and follow-up psychiatric evaluations every day.  Psychiatric diagnoses provided upon initial assessment: Major Depressive Disorder (MDD), Alcohol Use Disorder (AUD)  During admission patient's psychiatric medications included:  Continued Zoloft 50 mg (started 1/9) for depressive symptoms.   Patient's care was discussed during the interdisciplinary team meeting every day during the hospitalization.  The patient denied having side effects to prescribed psychiatric medication.  Gradually, patient started adjusting to milieu. The patient was evaluated each day by a clinical provider to ascertain response to treatment. Improvement was noted by the patient's report of decreasing symptoms, improved sleep and appetite, affect, medication tolerance, behavior, and participation in unit programming.  Patient was asked each day to complete a self inventory noting mood, mental status, pain, new symptoms, anxiety and concerns.   Symptoms were reported as significantly decreased or resolved completely by discharge.  The patient reports that their mood is stable.  The patient denied having suicidal thoughts for more than 48 hours prior to discharge.  Patient denies having homicidal thoughts.  Patient denies having auditory hallucinations.  Patient denies any visual hallucinations or other symptoms of psychosis.  The patient was motivated to continue taking medication with a goal of continued improvement in mental health.   The patient reports their target psychiatric symptoms of depression and anxiety responded well to the psychiatric medications, and the patient reports overall benefit during their psychiatric hospitalization. Supportive psychotherapy was provided to the patient. The patient also participated in regular group therapy while hospitalized. Coping skills, problem solving as well as  relaxation therapies were also part of the unit programming.  Labs were reviewed with the patient, and abnormal results were discussed with the patient.  The patient is able to verbalize their individual safety plan to this provider.  # It is recommended to the patient to continue psychiatric medications as prescribed, after discharge from the hospital.    # It is recommended to the patient to follow up with your outpatient psychiatric provider and PCP.  # It was discussed with the patient, the impact of alcohol, drugs, tobacco have been there overall psychiatric and medical wellbeing, and total abstinence from substance use was recommended the patient.ed.  # Prescriptions provided or sent directly to preferred pharmacy at discharge. Patient agreeable to plan. Given opportunity to ask questions. Appears to feel comfortable with discharge.    # In the event of worsening symptoms, the patient is instructed to call the crisis hotline, 911 and or go to the nearest ED for appropriate evaluation and treatment of symptoms. To follow-up with primary care provider for other medical issues, concerns and or health care needs  # Patient was discharged ***[where] with a plan to follow up as noted below.

## 2021-01-22 NOTE — Progress Notes (Signed)
Pt verbalized feeling dizzy and light headed when standing. Pt sat back down on a chair and given some fluids to drink. Pt verbalized feeling better after fluids and slowly standing.    01/22/21 0620 01/22/21 3893  Vital Signs  Temp 98 F (36.7 C)  --   Temp Source Oral  --   Pulse Rate 80 75  Pulse Rate Source Monitor Monitor  BP (!) 139/110 101/75  BP Location Right Arm  --   BP Method Automatic Automatic  Patient Position (if appropriate) Sitting Standing  Oxygen Therapy  SpO2 100 % 100 %

## 2021-01-22 NOTE — Progress Notes (Signed)
[facility] Group Notes:  (Nursing/MHT/Case Management/Adjunct)  Date:  01/22/2021  Time:  2015  Type of Therapy:   wrap up group  Participation Level:  Active  Participation Quality:  Appropriate, Attentive, Sharing, and Supportive  Affect:  Appropriate  Cognitive:  Alert  Insight:  Improving  Engagement in Group:  Engaged  Modes of Intervention:  Clarification, Education, and Support  Summary of Progress/Problems: Positive thinking and positive change were discussed.   Marcille Buffy 01/22/2021, 9:45 PM

## 2021-01-22 NOTE — Progress Notes (Signed)
Pt said that his nausea has improved and he has not had any diarrhea today.  Pt denies SI/HI/AVH.  RN administered medications per provider orders and assessed for needs and concerns.  Pt is coping well at this time and in no acute distress.

## 2021-01-22 NOTE — Progress Notes (Signed)
[facility] MD Progress Note  01/22/2021 10:20 AM Danny West  MRN:  [id number] Subjective:   Danny West is a 46 year old male with a self-reported history of ADHD and depression, presenting as a voluntary walk-in to the behavioral health urgent care for "I am having a mental breakdown". He was admitted voluntarily to the behavioral health hospital for suicidal thoughts with a plan to jump in front of a train.  The patient's chart was reviewed and nursing notes were reviewed. Over the past 24 hrs, there were no documented behavioral issues, no PRN medications given for agitation, and the patient was compliant with scheduled medications. The patient's case was discussed in multidisciplinary team meeting.   On interview and assessment this morning, the patient is linear and logical with a depressed affect. Patient reports that he feels drained and tired today. He describes his mood as both "good" and "zoned out," stating that he feels better than yesterday, but that his energy is very low.  Patient reports he has had anxiety since September of 2022, but does not offer insight into prior anxiety before September impacting his life. Patient agreed to continue thinking about potential coping strategies for his anxiety. When asked about how he feels compared to admission patient reports improved appetite, mood, decreased feelings of emptiness, and improved sleep. Patient continues to endorse that there is a meth lab under his apartment. He also reports that he is "poor" and needs a different apartment in order to see his kids more often. Patient blames both his apartment situation and wife for his decreased time with his kids. When asked about other options for where he could live patient replied his father told him to "grow up," and that he is not able to live with his father. When discussing the potential for PHP, patient stated that he does not think this is possible due to work and that he will likely be working on  double shifts to improve his financial situation.  He did have an episode of dizziness this morning upon standing for orthostatic check, but reports he felt better after hydrating. Otherwise he reports that he has had continuous yellow diarrhea over the last few months that has persisted throughout his admission. Patient denies that he has had this issue with Zoloft in the past. Patient's leg continues to not bother him. Patient denies suicidal and homicidal thoughts. Patient denies side effects from Zoloft. Review of systems as below. Patient denies AH, VH, ideas of reference.   Principal Problem: MDD (major depressive disorder), recurrent severe, without psychosis ([place]) Diagnosis: Principal Problem:   MDD (major depressive disorder), recurrent severe, without psychosis ([place]) Active Problems:   Cellulitis of left lower extremity  Total Time Spent in Direct Patient Care: I personally spent 30 minutes on the unit in direct patient care. The direct patient care time included face-to-face time with the patient, reviewing the patient's chart, communicating with other professionals, and coordinating care. Greater than 50% of this time was spent in counseling or coordinating care with the patient regarding goals of hospitalization, psycho-education, and discharge planning needs.   Past Psychiatric Hx: Previous Psych Diagnoses: ADHD, depression Prior inpatient treatment: none Current/prior outpatient treatment: sees a therapist Prior rehab hx: none Psychotherapy hx: Psychiatric medication history: Prozac in 1998, and Zoloft from 2019-January of 2022. Current Psychiatrist: none Current therapist: "Lynann Bologna"  Past Medical History:  Past Medical History:  Diagnosis Date   Asthma     Past Surgical History:  Procedure Laterality Date   NO PAST  SURGERIES     Family History: History reviewed. No pertinent family history. Family Psychiatric  History: alcohol abuse in mother and brother, bipolar in  father, no Hx of suicide Social History:  Social History   Substance and Sexual Activity  Alcohol Use Yes   Comment: drinks (4) 16 oz beers daily     Social History   Substance and Sexual Activity  Drug Use Never    Social History   Socioeconomic History   Marital status: Divorced    Spouse name: Not on file   Number of children: 2   Years of education: 12   Highest education level: Not on file  Occupational History   Not on file  Tobacco Use   Smoking status: Never   Smokeless tobacco: Current    Types: Snuff  Vaping Use   Vaping Use: Never used  Substance and Sexual Activity   Alcohol use: Yes    Comment: drinks (4) 16 oz beers daily   Drug use: Never   Sexual activity: Not on file  Other Topics Concern   Not on file  Social History Narrative   Pt lives alone and works at [employer]. Divorced 11/2019; twin boys are 48 years old   Social Determinants of Radio broadcast assistant Strain: Not on file  Food Insecurity: Not on file  Transportation Needs: Not on file  Physical Activity: Not on file  Stress: Not on file  Social Connections: Not on file   Additional Social History:   Patient reports sister has opioid use disorder.       Sleep: Fair  Appetite:  Fair  Current Medications: Current Facility-Administered Medications  Medication Dose Route Frequency Provider Last Rate Last Admin   acetaminophen (TYLENOL) tablet 650 mg  650 mg Oral Q6H PRN Prescilla Sours, PA-C       albuterol (VENTOLIN HFA) 108 (90 Base) MCG/ACT inhaler 1-2 puff  1-2 puff Inhalation Q6H PRN Prescilla Sours, PA-C       alum & mag hydroxide-simeth (MAALOX/MYLANTA) 200-200-20 MG/5ML suspension 30 mL  30 mL Oral Q4H PRN Margorie John W, PA-C       cephALEXin (KEFLEX) capsule 500 mg  500 mg Oral Q6H Margorie John W, PA-C   500 mg at 01/22/21 [id number]   hydrOXYzine (ATARAX) tablet 25 mg  25 mg Oral Q6H PRN Prescilla Sours, PA-C   25 mg at 01/21/21 1308   liothyronine (CYTOMEL) tablet 25 mcg  25 mcg  Oral Daily Corky Sox, MD   25 mcg at 01/22/21 [id number]   loperamide (IMODIUM) capsule 2-4 mg  2-4 mg Oral PRN Margorie John W, PA-C       LORazepam (ATIVAN) tablet 1 mg  1 mg Oral Q6H PRN Margorie John W, PA-C       magnesium hydroxide (MILK OF MAGNESIA) suspension 30 mL  30 mL Oral Daily PRN Margorie John W, PA-C       multivitamin with minerals tablet 1 tablet  1 tablet Oral Daily Margorie John W, PA-C   1 tablet at 01/22/21 [id number]   nicotine (NICODERM CQ - dosed in mg/24 hours) patch 14 mg  14 mg Transdermal Daily Margorie John W, PA-C   14 mg at 01/22/21 0835   ondansetron (ZOFRAN-ODT) disintegrating tablet 4 mg  4 mg Oral Q6H PRN Prescilla Sours, PA-C   4 mg at 01/22/21 [id number]   sertraline (ZOLOFT) tablet 50 mg  50 mg Oral QHS Prescilla Sours, PA-C   50  mg at 01/21/21 2132   thiamine tablet 100 mg  100 mg Oral Daily Prescilla Sours, PA-C   100 mg at 01/22/21 [id number]    Lab Results:  Results for orders placed or performed during the hospital encounter of 01/20/21 (from the past 48 hour(s))  Lipase, blood     Status: None   Collection Time: 01/20/21  6:14 PM  Result Value Ref Range   Lipase 39 11 - 51 U/L    Comment: Performed at [facility], [place] [address], [place], [place] [address]  Hepatic function panel     Status: Abnormal   Collection Time: 01/20/21  6:14 PM  Result Value Ref Range   Total Protein 7.5 6.5 - 8.1 g/dL   Albumin 4.1 3.5 - 5.0 g/dL   AST 54 (H) 15 - 41 U/L   ALT 54 (H) 0 - 44 U/L   Alkaline Phosphatase 192 (H) 38 - 126 U/L   Total Bilirubin 1.4 (H) 0.3 - 1.2 mg/dL   Bilirubin, Direct 0.3 (H) 0.0 - 0.2 mg/dL   Indirect Bilirubin 1.1 (H) 0.3 - 0.9 mg/dL    Comment: Performed at [facility], [place] [address] [place], [place] [address]  Protime-INR     Status: None   Collection Time: 01/20/21  6:14 PM  Result Value Ref Range   Prothrombin Time 13.3 11.4 - 15.2 seconds   INR 1.0 0.8 - 1.2    Comment: (NOTE) INR goal varies based on  device and disease states. Performed at [facility], [place] [address], [place], [place] [address]   Lipid panel     Status: Abnormal   Collection Time: 01/22/21  6:12 AM  Result Value Ref Range   Cholesterol 226 (H) 0 - 200 mg/dL   Triglycerides 209 (H) <150 mg/dL   HDL 50 >40 mg/dL   Total CHOL/HDL Ratio 4.5 RATIO   VLDL 42 (H) 0 - 40 mg/dL   LDL Cholesterol 134 (H) 0 - 99 mg/dL    Comment:        Total Cholesterol/HDL:CHD Risk Coronary Heart Disease Risk Table                     Men   Women  1/2 Average Risk   3.4   3.3  Average Risk       5.0   4.4  2 X Average Risk   9.6   7.1  3 X Average Risk  23.4   11.0        Use the calculated Patient Ratio above and the CHD Risk Table to determine the patient's CHD Risk.        ATP III CLASSIFICATION (LDL):  <100     mg/dL   Optimal  100-129  mg/dL   Near or Above                    Optimal  130-159  mg/dL   Borderline  160-189  mg/dL   High  >190     mg/dL   Very High Performed at [place] [address], [place],  [address]     Blood Alcohol level:  Lab Results  Component Value Date   ETH 11 (H) 123456    Metabolic Disorder Labs: Lab Results  Component Value Date   HGBA1C 4.3 (L) 01/19/2021   MPG 76.71 01/19/2021   No results found for: PROLACTIN Lab Results  Component  Value Date   CHOL 226 (H) 01/22/2021   TRIG 209 (H) 01/22/2021   HDL 50 01/22/2021   CHOLHDL 4.5 01/22/2021   VLDL 42 (H) 01/22/2021   LDLCALC 134 (H) 01/22/2021   LDLCALC UNABLE TO CALCULATE IF TRIGLYCERIDE OVER 400 mg/dL 01/19/2021    Physical Findings: CIWA:  CIWA-Ar Total: 0  Musculoskeletal: Strength & Muscle Tone: within normal limits Gait & Station: normal Patient leans: N/A  Psychiatric Specialty Exam:  Presentation  General Appearance:  middle aged male, dressed appropriately for environment Eye Contact:Good  Speech:Clear and Coherent; Normal Rate  Speech  Volume:Normal  Handedness:No data recorded  Mood and Affect  Mood:Anxious; Depressed  Affect: congruent  Thought Process  Thought Processes:Coherent; Goal Directed; Linear  Descriptions of Associations:Intact  Orientation:Full (Time, Place and Person)  Thought Content:Logical; WDL Denies SI/HI/AVH.  History of Schizophrenia/Schizoaffective disorder: NA Duration of Psychotic Symptoms: NA Hallucinations: NA Ideas of Reference: none Suicidal Thoughts: denies Homicidal Thoughts: denies  Sensorium  Memory:Immediate Good; Recent Good; Remote Good  Judgment:Fair  Insight:Fair   Executive Functions  Concentration:Fair  Attention Span:Fair  Mission of Knowledge:Good  Language:Good   Psychomotor Activity  Psychomotor Activity: normal Assets  Assets:Communication Skills; Desire for Improvement; Housing; Leisure Time; Physical Health; Resilience; Vocational/Educational   Sleep  Sleep: fair  Physical Exam: Physical Exam Vitals reviewed.  Constitutional:      Appearance: He is not toxic-appearing.  Pulmonary:     Effort: Pulmonary effort is normal.  Neurological:     Mental Status: He is alert and oriented to person, place, and time.   Review of Systems  Respiratory:  Negative for shortness of breath.   Cardiovascular:  Negative for chest pain.  Gastrointestinal:  Positive for diarrhea. Negative for constipation, nausea and vomiting.  Blood pressure 101/75, pulse 75, temperature 98 F (36.7 C), temperature source Oral, resp. rate 18, height 5\' 7"  (1.702 m), weight 90.4 kg, SpO2 100 %. Body mass index is 31.23 kg/m.   Treatment Plan Summary: Daily contact with patient to assess and evaluate symptoms and progress in treatment and Medication management  Safety and Monitoring -- VOLUNTARY admission to inpatient psychiatric unit for safety, stabilization and treatment -- Daily contact with patient to assess and evaluate symptoms and progress in  treatment -- Patient's case to be discussed in multi-disciplinary team meeting -- Observation Level : q15 minute checks -- Vital signs:  q12 hours -- Precautions: suicide   Diagnoses: MDD, recurrent, severe Alcohol use disorder Hypothyroidism Asthma   MDD, recurrent, severe Severe symptoms as documented in H&P. There may be some contribution from his hypothyroidism.  -Continue Zoloft 50 mg (started 1/9) for depressive symptoms, patient last took this medicine in January of 2022 and reports good effect then.  -Consider addition of Remeron for sleep -Continue cytomel 25 mcg (discontinue Synthroid) for hypothyroidism and adjunct of Zoloft -Supportive psychotherapy along with cognitive approaches -PHP not possible due to work situation - Discontinued trazodone d/t sedating effect   Alcohol use disorder -Encourage reduction in use or cessation -Patient denies Hx of w/d symptoms previously - CIWA q12hrs -Ativan 1 mg PRN CIWA >10   Continue PRN's: Tylenol, Maalox, Atarax, Milk of Magnesia  Medical Management Covid negative CMP: AST/ALT of 82/68, AP of 227 , repeat of AST/ALT of 54/54, AP down to 192, lipase of 39, expect continued decrease in abnormal labs.  CBC: unremarkable EtOH: 11 UDS: negative TSH: 104 (see below); T4 of 0.4, T3 1.5 A1C: not obtained Lipids: s/p fasting lipid panel (  Tcholesterol 226, [id number], Tgl 209) ASCVD 10 year risk 5%.  EKG: not obtained, now ordered   Hypothyroidism Patient has not taken a consistent regimen since at least one year ago. He was started on Synthroid, but this is reportedly his sister's medication that he has been taking for the past 2 weeks. TSH: 104; T4 of 0.4, T3 1.5 -Continue cytomel 25 mcg -Recheck levels as an outpatient   Asthma -Continue albuterol inhaler   Likely Cellulitis  -Continue Keflex for a 5 day course -Monitor response to [place] MS3   I personally was present and performed or re-performed the history,  physical exam and medical decision-making activities of this service and have verified that the service and findings are accurately documented in the students note.  Corky Sox, MD PGY-1

## 2021-01-22 NOTE — Progress Notes (Signed)
°   01/22/21 0700  Sleep  Number of Hours 5.75

## 2021-01-23 MED ORDER — SERTRALINE HCL 50 MG PO TABS
50.0000 mg | ORAL_TABLET | Freq: Every day | ORAL | Status: DC
  Administered 2021-01-24: 50 mg via ORAL
  Filled 2021-01-23 (×2): qty 1
  Filled 2021-01-23: qty 7

## 2021-01-23 NOTE — [facility] Group Notes (Signed)
[place] Group Notes:  (Nursing/MHT/Case Management/Adjunct)  Date:  01/23/2021  Time:  2:34 PM  Type of Therapy:  Psychoeducational Skills  Participation Level:  Active  Participation Quality:  Appropriate  Affect:  Appropriate  Cognitive:  Appropriate  Insight:  Appropriate  Engagement in Group:  Engaged  Modes of Intervention:  Education  Summary of Progress/Problems: Patient was educated on how to mange his expectations with people, places and things. Patient was clear in his understanding as evidenced by verbaling that by reducing his expectations it would provide a sense of well-being.   Jerrye Beavers 01/23/2021, 2:34 PM

## 2021-01-23 NOTE — Progress Notes (Signed)
Pt denies SI/HI/AVH.  Pt is participating in groups and he is engaging in spontaneous conversation with RN.  Pt says he is discharging from [facility] tomorrow and he feels ready for discharge.

## 2021-01-23 NOTE — Group Note (Signed)
LCSW Group Therapy Note   Group Date: 01/23/2021 Start Time: 1300 End Time: 1400  Type of Therapy/Topic:  Group Therapy:  Balance in Life  Participation Level:  Active  Description of Group:    This group will address the concept of balance and how it feels and looks when one is unbalanced. Patients will be encouraged to process areas in their lives that are out of balance and identify reasons for remaining unbalanced. Facilitators will guide patients in utilizing problem-solving interventions to address and correct the stressor making their life unbalanced. Understanding and applying boundaries will be explored and addressed for obtaining and maintaining a balanced life. Patients will be encouraged to explore ways to assertively make their unbalanced needs known to significant others in their lives, using other group members and facilitator for support and feedback.  Therapeutic Goals: Patient will identify two or more emotions or situations they have that consume much of in their lives. Patient will identify two ways to set boundaries in order to achieve balance in their lives:   Summary of Patient Progress:   Pt participated in group and remained there the entire time.  The Pt accepted the worksheet that was provided and followed along throughout the group session.  The Pt discussed things that are in their control and out of their control.  The Pt was appropriate and maintained a positive mood.   Aram Beecham, LCSWA 01/23/2021  1:39 PM

## 2021-01-23 NOTE — Progress Notes (Signed)
Adult Psychoeducational Group Note  Date:  01/23/2021 Time:  9:50 PM  Group Topic/Focus:  Wrap-Up Group:   The focus of this group is to help patients review their daily goal of treatment and discuss progress on daily workbooks.  Participation Level:  Active  Participation Quality:  Appropriate  Affect:  Appropriate  Cognitive:  Appropriate  Insight: Appropriate  Engagement in Group:  Engaged  Modes of Intervention:  Discussion  Additional Comments:  Patient attend AA meeting.  Charna Busman Long 01/23/2021, 9:50 PM

## 2021-01-23 NOTE — Progress Notes (Signed)
[facility] MD Progress Note  01/23/2021 11:48 AM Nichol Emmel  MRN:  [id number] Subjective:   Danny West is a 46 year old male with a self-reported history of ADHD and depression, presenting as a voluntary walk-in to the behavioral health urgent care for "I am having a mental breakdown". He was admitted voluntarily to the behavioral health hospital for suicidal thoughts with a plan to jump in front of a train.  The patient's chart was reviewed and nursing notes were reviewed. Over the past 24 hrs, there were no documented behavioral issues, no PRN medications given for agitation, and the patient was compliant with scheduled medications. The patient's case was discussed in multidisciplinary team meeting.   On interview and assessment this morning, the patient is linear and logical with a depressed affect. Patient reports that he feels good today. Yesterday he was able to go to groups. He reports still being anxious about going to talk to his Dad in [place], making money, getting back to work, finding a new apartment, and getting his kids back in his life. He reports that his mood is, "pretty good." He continues to blame chemicals from the potential meth lab below his apartment for both his anxiety and why he can't see his kids. He feels good about going home and says he is ready to get back to [place] to find a new apartment. Patient reports his appetite is good, sleep is great.  He denies chest pain, SOB, vomiting. He reports is diarrhea has stopped and he is no longer nauseous. Patient denies that he has had this issue with Zoloft in the past. Patient's leg continues to not bother him. Patient denies suicidal and homicidal thoughts. Patient denies side effects from Zoloft. Review of systems as below. Patient denies AH, VH, ideas of reference.   Principal Problem: MDD (major depressive disorder), recurrent severe, without psychosis ([place]) Diagnosis: Principal Problem:   MDD (major depressive disorder),  recurrent severe, without psychosis ([place]) Active Problems:   Cellulitis of left lower extremity  Total Time Spent in Direct Patient Care: I personally spent 30 minutes on the unit in direct patient care. The direct patient care time included face-to-face time with the patient, reviewing the patient's chart, communicating with other professionals, and coordinating care. Greater than 50% of this time was spent in counseling or coordinating care with the patient regarding goals of hospitalization, psycho-education, and discharge planning needs.   Past Psychiatric Hx: Previous Psych Diagnoses: ADHD, depression Prior inpatient treatment: none Current/prior outpatient treatment: sees a therapist Prior rehab hx: none Psychotherapy hx: Psychiatric medication history: Prozac in 1998, and Zoloft from 2019-January of 2022. Current Psychiatrist: none Current therapist: "Lynann Bologna"  Past Medical History:  Past Medical History:  Diagnosis Date   Asthma     Past Surgical History:  Procedure Laterality Date   NO PAST SURGERIES     Family History: History reviewed. No pertinent family history. Family Psychiatric  History: alcohol abuse in mother and brother, bipolar in father, no Hx of suicide Social History:  Social History   Substance and Sexual Activity  Alcohol Use Yes   Comment: drinks (4) 16 oz beers daily     Social History   Substance and Sexual Activity  Drug Use Never    Social History   Socioeconomic History   Marital status: Divorced    Spouse name: Not on file   Number of children: 2   Years of education: 12   Highest education level: Not on file  Occupational History  Not on file  Tobacco Use   Smoking status: Never   Smokeless tobacco: Current    Types: Snuff  Vaping Use   Vaping Use: Never used  Substance and Sexual Activity   Alcohol use: Yes    Comment: drinks (4) 16 oz beers daily   Drug use: Never   Sexual activity: Not on file  Other Topics Concern    Not on file  Social History Narrative   Pt lives alone and works at [employer]. Divorced 11/2019; twin boys are 48 years old   Social Determinants of Radio broadcast assistant Strain: Not on file  Food Insecurity: Not on file  Transportation Needs: Not on file  Physical Activity: Not on file  Stress: Not on file  Social Connections: Not on file   Additional Social History:   Patient reports sister has opioid use disorder.       Sleep: Fair  Appetite:  Good  Current Medications: Current Facility-Administered Medications  Medication Dose Route Frequency Provider Last Rate Last Admin   acetaminophen (TYLENOL) tablet 650 mg  650 mg Oral Q6H PRN Prescilla Sours, PA-C       albuterol (VENTOLIN HFA) 108 (90 Base) MCG/ACT inhaler 1-2 puff  1-2 puff Inhalation Q6H PRN Prescilla Sours, PA-C       alum & mag hydroxide-simeth (MAALOX/MYLANTA) 200-200-20 MG/5ML suspension 30 mL  30 mL Oral Q4H PRN Margorie John W, PA-C       cephALEXin (KEFLEX) capsule 500 mg  500 mg Oral Q6H Lovena Le, Cody W, PA-C   500 mg at 01/23/21 1028   hydrOXYzine (ATARAX) tablet 25 mg  25 mg Oral TID PRN Prescilla Sours, PA-C   25 mg at 01/22/21 2219   liothyronine (CYTOMEL) tablet 25 mcg  25 mcg Oral Daily Corky Sox, MD   25 mcg at 01/23/21 1028   magnesium hydroxide (MILK OF MAGNESIA) suspension 30 mL  30 mL Oral Daily PRN Prescilla Sours, PA-C       multivitamin with minerals tablet 1 tablet  1 tablet Oral Daily Margorie John W, PA-C   1 tablet at 01/23/21 1027   nicotine (NICODERM CQ - dosed in mg/24 hours) patch 14 mg  14 mg Transdermal Daily Margorie John W, PA-C   14 mg at 01/23/21 1027   sertraline (ZOLOFT) tablet 50 mg  50 mg Oral QHS Margorie John W, PA-C   50 mg at 01/22/21 2117   thiamine tablet 100 mg  100 mg Oral Daily Margorie John W, PA-C   100 mg at 01/23/21 1027    Lab Results:  Results for orders placed or performed during the hospital encounter of 01/20/21 (from the past 48 hour(s))  Lipid panel      Status: Abnormal   Collection Time: 01/22/21  6:12 AM  Result Value Ref Range   Cholesterol 226 (H) 0 - 200 mg/dL   Triglycerides 209 (H) <150 mg/dL   HDL 50 >40 mg/dL   Total CHOL/HDL Ratio 4.5 RATIO   VLDL 42 (H) 0 - 40 mg/dL   LDL Cholesterol 134 (H) 0 - 99 mg/dL    Comment:        Total Cholesterol/HDL:CHD Risk Coronary Heart Disease Risk Table                     Men   Women  1/2 Average Risk   3.4   3.3  Average Risk       5.0  4.4  2 X Average Risk   9.6   7.1  3 X Average Risk  23.4   11.0        Use the calculated Patient Ratio above and the CHD Risk Table to determine the patient's CHD Risk.        ATP III CLASSIFICATION (LDL):  <100     mg/dL   Optimal  100-129  mg/dL   Near or Above                    Optimal  130-159  mg/dL   Borderline  160-189  mg/dL   High  >190     mg/dL   Very High Performed at [facility] [address], [place], [place] [address]     Blood Alcohol level:  Lab Results  Component Value Date   ETH 11 (H) 123456    Metabolic Disorder Labs: Lab Results  Component Value Date   HGBA1C 4.3 (L) 01/19/2021   MPG 76.71 01/19/2021   No results found for: PROLACTIN Lab Results  Component Value Date   CHOL 226 (H) 01/22/2021   TRIG 209 (H) 01/22/2021   HDL 50 01/22/2021   CHOLHDL 4.5 01/22/2021   VLDL 42 (H) 01/22/2021   LDLCALC 134 (H) 01/22/2021   LDLCALC UNABLE TO CALCULATE IF TRIGLYCERIDE OVER 400 mg/dL 01/19/2021    Physical Findings: CIWA:  CIWA-Ar Total: 2  Musculoskeletal: Strength & Muscle Tone: within normal limits Gait & Station: normal Patient leans: N/A  Psychiatric Specialty Exam:  Presentation  General Appearance:  middle aged male, dressed appropriately for environment Eye Contact:Good  Speech:Clear and Coherent; Normal Rate  Speech Volume:Normal  Handedness:No data recorded  Mood and Affect  Mood:Anxious; Depressed  Affect: congruent  Thought Process  Thought  Processes:Coherent; Goal Directed; Linear  Descriptions of Associations:Intact  Orientation:Full (Time, Place and Person)  Thought Content:Logical; WDL Denies SI/HI/AVH.  History of Schizophrenia/Schizoaffective disorder: NA Duration of Psychotic Symptoms: NA Hallucinations: NA Ideas of Reference: none Suicidal Thoughts: denies Homicidal Thoughts: denies  Sensorium  Memory:Immediate Good; Recent Good; Remote Good  Judgment:Fair  Insight:Fair   Executive Functions  Concentration:Fair  Attention Span:Fair  [place] of Knowledge:Good  Language:Good   Psychomotor Activity  Psychomotor Activity: normal Assets  Assets:Communication Skills; Desire for Improvement; Housing; Leisure Time; Physical Health; Resilience; Vocational/Educational   Sleep  Sleep: fair  Physical Exam: Physical Exam Vitals reviewed.  Constitutional:      Appearance: He is not toxic-appearing.  Pulmonary:     Effort: Pulmonary effort is normal.  Neurological:     Mental Status: He is alert and oriented to person, place, and time.   Review of Systems  Respiratory:  Negative for shortness of breath.   Cardiovascular:  Negative for chest pain.  Gastrointestinal:  Negative for constipation, diarrhea, nausea and vomiting.  Blood pressure 105/79, pulse 82, temperature 97.7 F (36.5 C), resp. rate 17, height 5\' 7"  (1.702 m), weight 90.4 kg, SpO2 98 %. Body mass index is 31.23 kg/m.   Treatment Plan Summary: Daily contact with patient to assess and evaluate symptoms and progress in treatment and Medication management  Safety and Monitoring -- VOLUNTARY admission to inpatient psychiatric unit for safety, stabilization and treatment -- Daily contact with patient to assess and evaluate symptoms and progress in treatment -- Patient's case to be discussed in multi-disciplinary team meeting -- Observation Level : q15 minute checks -- Vital signs:  q12 hours -- Precautions: suicide  Diagnoses: MDD, recurrent, severe Borderline personality disorder Alcohol use disorder Hypothyroidism Asthma   MDD, recurrent, severe Severe symptoms as documented in H&P. There may be some contribution from his hypothyroidism. -Continue Zoloft 50 mg (started 1/9) for depressive symptoms, patient last took this medicine in January of 2022 and reports good effect then.  -Consider addition of Remeron for sleep -Continue cytomel 25 mcg (discontinue Synthroid) for hypothyroidism and adjunct of Zoloft -Supportive psychotherapy along with cognitive approaches -PHP not possible due to work situation - Discontinued trazodone d/t sedating effect   Alcohol use disorder -Encourage reduction in use or cessation -Patient denies Hx of w/d symptoms previously - CIWA q12hrs -Ativan 1 mg PRN CIWA >10   Continue PRN's: Tylenol, Maalox, Atarax, Milk of Magnesia  Medical Management Covid negative CMP: AST/ALT of 82/68, AP of 227 , repeat of AST/ALT of 54/54, AP down to 192, lipase of 39, expect continued decrease in abnormal labs.  CBC: unremarkable EtOH: 11 UDS: negative TSH: 104 (see below); T4 of 0.4, T3 1.5 A1C: not obtained Lipids: s/p fasting lipid panel (Tcholesterol 226, LDL134, Tgl 209) ASCVD 10 year risk 5%.  EKG: not obtained, now ordered   Hypothyroidism Patient has not taken a consistent regimen since at least one year ago. He was started on Synthroid, but this is reportedly his sister's medication that he has been taking for the past 2 weeks. TSH: 104; T4 of 0.4, T3 1.5 -Continue cytomel 25 mcg -Recheck levels as an outpatient   Asthma -Continue albuterol inhaler   Likely Cellulitis  -Continue Keflex for a 5 day course -Monitor response to [place] [facility]  I personally was present and performed or re-performed the history, physical exam and medical decision-making activities of this service and have verified that the service and findings are accurately documented in  the students note.  Corky Sox, MD PGY-1

## 2021-01-23 NOTE — Group Note (Signed)
Date:  01/23/2021 Time:  4:37 PM  Group Topic/Focus:  Orientation:   The focus of this group is to educate the patient on the purpose and policies of crisis stabilization and provide a format to answer questions about their admission.  The group details unit policies and expectations of patients while admitted.    Participation Level:  Active  Participation Quality:  Appropriate  Affect:  Appropriate  Cognitive:  Appropriate  Insight: Good  Engagement in Group:  Engaged  Modes of Intervention:  Discussion  Additional Comments:    Reymundo Poll 01/23/2021, 4:37 PM

## 2021-01-23 NOTE — Group Note (Signed)
Recreation Therapy Group Note   Group Topic:Stress Management  Group Date: 01/23/2021 Start Time: 0930 End Time: 0945 Facilitators: Caroll Rancher, LRT,CTRS Location: [address] Dayroom   Goal Area(s) Addresses:  Patient will actively participate in stress management techniques presented during session.  Patient will successfully identify benefit of practicing stress management post d/c.   Group Description: Guided Imagery. LRT provided education, instruction, and demonstration on practice of visualization via guided imagery. Patient was asked to participate in the technique introduced during session. LRT debriefed including topics of mindfulness, stress management and specific scenarios each patient could use these techniques. Patients were given suggestions of ways to access scripts post d/c and encouraged to explore Youtube and other apps available on smartphones, tablets, and computers.   Affect/Mood: Appropriate   Participation Level: Active   Participation Quality: Independent   Behavior: Appropriate   Speech/Thought Process: Focused   Insight: Good   Judgement: Good   Modes of Intervention: Script   Patient Response to Interventions:  Attentive   Education Outcome:  Acknowledges education and In group clarification offered    Clinical Observations/Individualized Feedback: Pt attended and participated in group.    Plan: Continue to engage patient in RT group sessions 2-3x/week.   Caroll Rancher, LRT,CTRS 01/23/2021 11:21 AM

## 2021-01-23 NOTE — Group Note (Signed)
Date:  01/23/2021 Time:  4:33 PM  Group Topic/Focus:  Orientation:   The focus of this group is to educate the patient on the purpose and policies of crisis stabilization and provide a format to answer questions about their admission.  The group details unit policies and expectations of patients while admitted.    Participation Level:  Active  Participation Quality:  Appropriate  Affect:  Appropriate  Cognitive:  Appropriate  Insight: Appropriate  Engagement in Group:  Engaged  Modes of Intervention:  Discussion  Additional Comments:    Reymundo Poll 01/23/2021, 4:33 PM

## 2021-01-24 DIAGNOSIS — F332 Major depressive disorder, recurrent severe without psychotic features: Principal | ICD-10-CM

## 2021-01-24 MED ORDER — NICOTINE 14 MG/24HR TD PT24: 14.0000 mg | MEDICATED_PATCH | Freq: Every day | TRANSDERMAL | 1 refills | Status: DC

## 2021-01-24 MED ORDER — SERTRALINE HCL 50 MG PO TABS: 50.0000 mg | ORAL_TABLET | Freq: Every day | ORAL | 1 refills | Status: DC

## 2021-01-24 MED ORDER — LIOTHYRONINE SODIUM 25 MCG PO TABS: 25.0000 ug | ORAL_TABLET | Freq: Every day | ORAL | 1 refills | Status: DC

## 2021-01-24 NOTE — Progress Notes (Signed)
°   01/24/21 0700  °Sleep  °Number of Hours 7  ° ° °
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
## 2021-01-24 NOTE — Progress Notes (Addendum)
D. Pt presented calm and cooperative, rated his depression, hopelessness and anxiety a 3/2/4, respectively, and reported that he is "ready to go home".  Pt denied SI/HI and AVH   A. Labs and vitals monitored. Pt given and educated on medications. Pt supported emotionally and encouraged to express concerns and ask questions.   R. Pt remains safe with 15 minute checks. Will continue POC.         01/24/21 1100  Psych Admission Type (Psych Patients Only)  Admission Status Voluntary  Psychosocial Assessment  Patient Complaints None  Eye Contact Fair  Facial Expression Anxious  Affect Appropriate to circumstance  Speech Logical/coherent  Interaction Assertive  Motor Activity Other (Comment) (WDL)  Appearance/Hygiene Unremarkable  Behavior Characteristics Cooperative  Mood Pleasant  Aggressive Behavior  Targets  (none)  Thought Process  Coherency WDL  Content WDL  Delusions None reported or observed  Perception WDL  Hallucination None reported or observed  Judgment WDL  Confusion None  Danger to Self  Current suicidal ideation? Denies  Danger to Others  Danger to Others None reported or observed

## 2021-01-24 NOTE — Progress Notes (Signed)
D- Patient alert and oriented x4. Patient in stable mood.  Denies SI, HI, AVH. Pt interacting positively with staff and peers.    A- Scheduled medications administered to patient, per MD orders.Routine safety checks conducted every 15 minutes.  Patient informed to notify staff with problems or concerns.   R- Patient compliant with medications and verbalized understanding treatment plan. Patient receptive, calm, and cooperative.    01/23/21 2055  Psych Admission Type (Psych Patients Only)  Admission Status Voluntary  Psychosocial Assessment  Patient Complaints None  Eye Contact Fair  Facial Expression Fixed smile  Affect Appropriate to circumstance  Speech Logical/coherent  Interaction Assertive  Motor Activity Other (Comment) (WDL)  Appearance/Hygiene Unremarkable  Behavior Characteristics Cooperative  Mood Pleasant  Thought Process  Coherency WDL  Content Blaming self;Blaming others  Delusions None reported or observed  Perception WDL  Hallucination None reported or observed  Judgment WDL  Confusion None  Danger to Self  Current suicidal ideation? Denies  Danger to Others  Danger to Others None reported or observed

## 2021-01-24 NOTE — Progress Notes (Signed)
°  [facility] Adult Case Management Discharge Plan :  Will you be returning to the same living situation after discharge:  Yes,  own home At discharge, do you have transportation home?: Yes,  patient's car is in [facility] parking lot Do you have the ability to pay for your medications: No.  Needs help from community agency  Release of information consent forms completed and emailed to Medical Records, then turned in to Medical Records by CSW.   Patient to Follow up at:  Follow-up Information     [facility] Go on 01/27/2021.   Specialty: Professional Counselor Why: You have an appointment with Lollie Sails for therapy services on 01/27/21 at 5:00 pm  ( x [phone]).   You have an appointment for medication management services on 02/09/21 at 2:00 pm. (Please arrive 30 prior for all appointments) (This appointment will be held in person, but may be changed to Virtual with advanced notice).  * PLEASE CALL TO CONFIRM ALL APPTS. Contact information: [employer] of the [place] [address] [place] [address] [phone]         [facility]. Go on 03/05/2021.   Why: You have a hospital follow up appointment for primary care services on 03/05/21 at 2:30 pm.  This appointment will be held in person. Contact information: [address] [place] [address] [phone]        [facility] Follow up on 02/02/2021.   Specialty: Behavioral Health Why: You have an assessment appointment for the partial hospitalization program for intensive therapy, and medication management services on Monday, 02/02/21 at 10:00 am.  This appointment is for assessment purposes only.  It will be a Virtual appointment.  *You will need to willing and able to participate in GROUP treatment M-F from 9-1 daily. Contact information: [address] [place] [address] [phone]        [facility] Follow  up.   Why: You may contact this agency to inquire about group services and DBT services. Contact information: Address: [address], [place], [place] [address]  Phone: [phone] Fax: [phone]        [facility] Follow up.   Why: You may contact this agency to inquire about group services and DBT services. Contact information: [address] [place] [address] [phone]                 Next level of care provider has access to [facility] Link:no  Safety Planning and Suicide Prevention discussed: No.  Declined consents     Has patient been referred to the Quitline?: N/A patient is not a smoker  Patient has been referred for addiction treatment: Yes  Lynnell Chad, LCSW 01/24/2021, 9:13 AM

## 2021-01-24 NOTE — Progress Notes (Signed)
Adult Psychoeducational Group Note  Date:  01/24/2021 Time:  9:46 AM  Group Topic/Focus:  Goals Group:   The focus of this group is to help patients establish daily goals to achieve during treatment and discuss how the patient can incorporate goal setting into their daily lives to aide in recovery.  Participation Level:  Active  Participation Quality:  Appropriate  Affect:  Appropriate  Cognitive:  Appropriate  Insight: Appropriate  Engagement in Group:  Engaged  Modes of Intervention:  Discussion  Additional Comments:  Pt stated his goal for the day is to get ready for discharge.  [name] 01/24/2021, 9:46 AM

## 2021-01-24 NOTE — Progress Notes (Signed)
Pt discharged to lobby. Shuttle present to transport pt to [facility] where his car is parked.  Pt was stable and appreciative at that time. All papers, samples and electronic prescriptions were given and valuables returned. Verbal understanding expressed. Denies SI/HI and A/VH. Pt given opportunity to express concerns and ask questions.

## 2021-02-02 ENCOUNTER — Ambulatory Visit (HOSPITAL_COMMUNITY): Payer: Federal, State, Local not specified - Other | Admitting: Professional

## 2021-02-02 ENCOUNTER — Telehealth (HOSPITAL_COMMUNITY): Payer: Self-pay

## 2021-02-02 NOTE — BH Assessment (Signed)
Care Management - Follow Up [facility] Discharges  ° °Patient has been placed in an inpatient psychiatric hospital ([facility]) on 01-20-2021.  °

## 2021-02-02 NOTE — Psych (Signed)
Virtual Visit via Video Note  I connected with Danny West on 02/02/21 at 10:00 AM EST by a video enabled telemedicine application and verified that I am speaking with the correct person using two identifiers.  Location: Patient: Home Provider: Clinical Home Office   I discussed the limitations of evaluation and management by telemedicine and the availability of in person appointments. The patient expressed understanding and agreed to proceed.  Follow Up Instructions:    I discussed the assessment and treatment plan with the patient. The patient was provided an opportunity to ask questions and all were answered. The patient agreed with the plan and demonstrated an understanding of the instructions.   The patient was advised to call back or seek an in-person evaluation if the symptoms worsen or if the condition fails to improve as anticipated.  I provided 15 minutes of non-face-to-face time during this encounter.   Royetta Crochet, LCMHCA  Cln oriented pt to PHP. Pt declined at this time due to scheduling and finances. Cln enquired about outpatient providers: pt shared he sees Lynann Bologna at [facility]. Pt reports Lynann Bologna requested to increase visits from 1x a month to 1x weekly. Pt reports he will reach out to [facility] today to verify appointments were made. Pt reports he last saw Lynann Bologna on 01/27/21. Pt expressed he would like to continue medications and cln provided referrals for [facility] and [facility] for med management. Cln encouraged pt to contact to schedule appointments. Pt agreed. Pt scheduled to see PCP on 2/23 and can discuss medications with provider. Pt also encouraged to contact [facility] for DBT services as was referred at discharge from inpt. Pt verbalized understanding. Cln encouraged pt to contact cln if further referrals or PHP needed in the future. Pt agrees. Pt denies SI/HI.

## 2021-03-05 ENCOUNTER — Ambulatory Visit: Payer: BC Managed Care – PPO | Attending: Internal Medicine | Admitting: Internal Medicine

## 2021-03-05 ENCOUNTER — Emergency Department (HOSPITAL_BASED_OUTPATIENT_CLINIC_OR_DEPARTMENT_OTHER)
Admission: EM | Admit: 2021-03-05 | Discharge: 2021-03-05 | Disposition: A | Discharge status: Home / Self Care | Payer: BC Managed Care – PPO | Attending: Emergency Medicine | Admitting: Emergency Medicine

## 2021-03-05 ENCOUNTER — Encounter (HOSPITAL_BASED_OUTPATIENT_CLINIC_OR_DEPARTMENT_OTHER): Payer: Self-pay

## 2021-03-05 ENCOUNTER — Other Ambulatory Visit: Payer: Self-pay

## 2021-03-05 DIAGNOSIS — F1091 Alcohol use, unspecified, in remission: Secondary | ICD-10-CM | POA: Diagnosis not present

## 2021-03-05 DIAGNOSIS — F321 Major depressive disorder, single episode, moderate: Secondary | ICD-10-CM

## 2021-03-05 DIAGNOSIS — Z79899 Other long term (current) drug therapy: Secondary | ICD-10-CM | POA: Diagnosis not present

## 2021-03-05 DIAGNOSIS — J4520 Mild intermittent asthma, uncomplicated: Secondary | ICD-10-CM

## 2021-03-05 DIAGNOSIS — R7989 Other specified abnormal findings of blood chemistry: Secondary | ICD-10-CM | POA: Insufficient documentation

## 2021-03-05 DIAGNOSIS — E039 Hypothyroidism, unspecified: Secondary | ICD-10-CM | POA: Insufficient documentation

## 2021-03-05 DIAGNOSIS — Z720 Tobacco use: Secondary | ICD-10-CM

## 2021-03-05 DIAGNOSIS — R519 Headache, unspecified: Secondary | ICD-10-CM | POA: Insufficient documentation

## 2021-03-05 DIAGNOSIS — E782 Mixed hyperlipidemia: Secondary | ICD-10-CM

## 2021-03-05 DIAGNOSIS — Z7689 Persons encountering health services in other specified circumstances: Secondary | ICD-10-CM

## 2021-03-05 DIAGNOSIS — F17290 Nicotine dependence, other tobacco product, uncomplicated: Secondary | ICD-10-CM

## 2021-03-05 MED ORDER — ALBUTEROL SULFATE HFA 108 (90 BASE) MCG/ACT IN AERS: 2.0000 | INHALATION_SPRAY | Freq: Four times a day (QID) | RESPIRATORY_TRACT | 6 refills | Status: DC | PRN

## 2021-03-05 MED ORDER — LEVOTHYROXINE SODIUM 75 MCG PO TABS: 75.0000 ug | ORAL_TABLET | Freq: Every day | ORAL | 2 refills | Status: DC

## 2021-03-05 MED ORDER — SERTRALINE HCL 50 MG PO TABS: 50.0000 mg | ORAL_TABLET | Freq: Every day | ORAL | 4 refills | Status: DC
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
## 2021-03-05 NOTE — Discharge Instructions (Signed)
Your exam and vitals today were reassuring.  No evidence of any toxicity from chemical exposure.  If you have worsening symptoms, have difficulty breathing, or chest pain please return to the emergency room.  Otherwise continue to work with Glass blower/designer.  If you are able to remove yourself from the environment.  You can take Tylenol or Motrin for headache.

## 2021-03-05 NOTE — Progress Notes (Signed)
Patient ID: Danny West, male   DOB: Jul 02, 1975, 46 y.o.   MRN: [id number] Virtual Visit via Telephone Note  I connected with Danny West on 03/05/2021 at 2:23 PM by telephone and verified that I am speaking with the correct person using two identifiers  Location: Patient: home Provider: office  Participants: Myself Patient I discussed the limitations, risks, security and privacy concerns of performing an evaluation and management service by telephone and the availability of in person appointments. I also discussed with the patient that there may be a patient responsible charge related to this service. The patient expressed understanding and agreed to proceed.   History of Present Illness: This is a new pt visit.  Hx of MDD, ETOH abuse, chew snuff,  hypothyroid, HL, asthma  No PCP in 1.5 yrs.  Previous PCP was in [place].  Previous PCP her in [place] was Dr. Clelia West whom he saw 3 yrs ago.  Pt admitted 1/10-14/2023 for MDD with SI.  He was discharged on Zoloft. On Zoloft 50 mg daily which is helpful.  Feels he is on adequate dose Has appt [facility] 03/09/21.  Also followed by therapist every wk, Danny West.   No SI/HI at this time   Hx of hypothyroid since he was in his teens. He was off med since last Feb 2022.  He was on Levothyroxine. No prior XRT or thyroid surgery. No unexplained wgh gain.  Had wgh loss about 6 lbs.  Gets cold but not all the time. No hair loss.  No palpitations.  Upon hospital discharge last month, they put him on Cytomel.  TSH at that time was greater than 100.  ETOH Use disorder:  "I was trying to self medicate."  Last drank the day he checked into [facility] for hosp.  going to groups now 2x/wk at [facility]. LFTs were elevated with AST/ALT of 54/54/alkaline phosphatase 192/direct bili 0.3.  Asthma:  since childhood. Mild.  Uses Albuterol inhaler 1-2x/wk.  Has Breo but states he hardly ever has to use it.  Has not used it in months.  Former smoker:  quit in 2002.  Then  started using sniff the same yr. Not ready to quit.  Wants to work on his mental health issues first.  Observations/Objective: Results for orders placed or performed during the hospital encounter of 01/20/21  Lipase, blood  Result Value Ref Range   Lipase 39 11 - 51 U/L  Hepatic function panel  Result Value Ref Range   Total Protein 7.5 6.5 - 8.1 g/dL   Albumin 4.1 3.5 - 5.0 g/dL   AST 54 (H) 15 - 41 U/L   ALT 54 (H) 0 - 44 U/L   Alkaline Phosphatase 192 (H) 38 - 126 U/L   Total Bilirubin 1.4 (H) 0.3 - 1.2 mg/dL   Bilirubin, Direct 0.3 (H) 0.0 - 0.2 mg/dL   Indirect Bilirubin 1.1 (H) 0.3 - 0.9 mg/dL  Protime-INR  Result Value Ref Range   Prothrombin Time 13.3 11.4 - 15.2 seconds   INR 1.0 0.8 - 1.2  Lipid panel  Result Value Ref Range   Cholesterol 226 (H) 0 - 200 mg/dL   Triglycerides 676 (H) <150 mg/dL   HDL 50 >72 mg/dL   Total CHOL/HDL Ratio 4.5 RATIO   VLDL 42 (H) 0 - 40 mg/dL   LDL Cholesterol 094 (H) 0 - 99 mg/dL      Assessment and Plan: 1. Establishing care with new doctor, encounter for  2. Moderate major depression (HCC) Patient  reports he is doing well on Zoloft 50 mg daily.  He does not feel he needs an increased dose at this time.  Plans to keep his appointment with [facility].  3. Alcohol use disorder in remission Commended him on quitting.  Encouraged him to remain alcohol free.  4. Acquired hypothyroidism Change Cytomel to levothyroxine 75 mg daily.  After he has been on the medication for 3 weeks, patient told to come to our lab to have blood test done to recheck thyroid function. - TSH+T4F+T3Free; Future  5. Mild intermittent asthma without complication No recent major flares. - albuterol (VENTOLIN HFA) 108 (90 Base) MCG/ACT inhaler; Inhale 2 puffs into the lungs every 6 (six) hours as needed for wheezing or shortness of breath.  Dispense: 8 g; Refill: 6  6. Snuff user Encouraged him to quit.  Patient does not feel that he is ready to do so  at this time.  7. Abnormal LFTs Elevation seen when he was in the hospital was likely due to EtOH abuse.  When he comes to have thyroid level checked, we will recheck LFTs. - Hepatic Function Panel; Future  8. Mixed hyperlipidemia Went over his lipid profile with him.  Dietary counseling given.  Follow Up Instructions: 1-2 mths   I discussed the assessment and treatment plan with the patient. The patient was provided an opportunity to ask questions and all were answered. The patient agreed with the plan and demonstrated an understanding of the instructions.   The patient was advised to call back or seek an in-person evaluation if the symptoms worsen or if the condition fails to improve as anticipated.  I  Spent 25 minutes on this telephone encounter  This note has been created with Education officer, environmental. Any transcriptional errors are unintentional.  Jonah Blue, MD

## 2021-05-04 ENCOUNTER — Ambulatory Visit: Payer: BC Managed Care – PPO | Attending: Internal Medicine

## 2021-05-04 ENCOUNTER — Other Ambulatory Visit: Payer: BC Managed Care – PPO

## 2021-05-04 DIAGNOSIS — R7989 Other specified abnormal findings of blood chemistry: Secondary | ICD-10-CM

## 2021-05-04 DIAGNOSIS — E039 Hypothyroidism, unspecified: Secondary | ICD-10-CM

## 2021-05-05 ENCOUNTER — Other Ambulatory Visit: Payer: Self-pay

## 2021-05-05 ENCOUNTER — Emergency Department (HOSPITAL_BASED_OUTPATIENT_CLINIC_OR_DEPARTMENT_OTHER)
Admission: EM | Admit: 2021-05-05 | Discharge: 2021-05-05 | Disposition: A | Discharge status: Home / Self Care | Payer: BC Managed Care – PPO | Attending: Emergency Medicine | Admitting: Emergency Medicine

## 2021-05-05 ENCOUNTER — Emergency Department (HOSPITAL_BASED_OUTPATIENT_CLINIC_OR_DEPARTMENT_OTHER): Payer: BC Managed Care – PPO | Admitting: Radiology

## 2021-05-05 ENCOUNTER — Encounter (HOSPITAL_BASED_OUTPATIENT_CLINIC_OR_DEPARTMENT_OTHER): Payer: Self-pay

## 2021-05-05 DIAGNOSIS — R519 Headache, unspecified: Secondary | ICD-10-CM | POA: Diagnosis not present

## 2021-05-05 DIAGNOSIS — J45909 Unspecified asthma, uncomplicated: Secondary | ICD-10-CM | POA: Diagnosis not present

## 2021-05-05 DIAGNOSIS — R200 Anesthesia of skin: Secondary | ICD-10-CM | POA: Diagnosis not present

## 2021-05-05 DIAGNOSIS — Z7951 Long term (current) use of inhaled steroids: Secondary | ICD-10-CM | POA: Diagnosis not present

## 2021-05-05 DIAGNOSIS — R079 Chest pain, unspecified: Secondary | ICD-10-CM | POA: Diagnosis present

## 2021-05-05 LAB — CBC
HCT: 43.5 % (ref 39.0–52.0)
Hemoglobin: 15.2 g/dL (ref 13.0–17.0)
MCH: 32.7 pg (ref 26.0–34.0)
MCHC: 34.9 g/dL (ref 30.0–36.0)
MCV: 93.5 fL (ref 80.0–100.0)
Platelets: 188 10*3/uL (ref 150–400)
RBC: 4.65 MIL/uL (ref 4.22–5.81)
RDW: 13.5 % (ref 11.5–15.5)
WBC: 9.2 10*3/uL (ref 4.0–10.5)
nRBC: 0 % (ref 0.0–0.2)

## 2021-05-05 LAB — BASIC METABOLIC PANEL
Anion gap: 14 (ref 5–15)
BUN: 10 mg/dL (ref 6–20)
CO2: 22 mmol/L (ref 22–32)
Calcium: 9.4 mg/dL (ref 8.9–10.3)
Chloride: 100 mmol/L (ref 98–111)
Creatinine, Ser: 0.76 mg/dL (ref 0.61–1.24)
GFR, Estimated: >60 mL/min (ref >60)
Glucose, Bld: 137 mg/dL — ABNORMAL HIGH (ref 70–99)
Potassium: 3.7 mmol/L (ref 3.5–5.1)
Sodium: 136 mmol/L (ref 135–145)

## 2021-05-05 LAB — TROPONIN I (HIGH SENSITIVITY): Troponin I (High Sensitivity): 2 ng/L (ref <18)

## 2021-05-05 LAB — HEPATIC FUNCTION PANEL
ALT: 10 IU/L (ref 0–44)
AST: 21 IU/L (ref 0–40)
Albumin: 4.2 g/dL (ref 4.0–5.0)
Alkaline Phosphatase: 164 IU/L — ABNORMAL HIGH (ref 44–121)
Bilirubin Total: 0.4 mg/dL (ref 0.0–1.2)
Bilirubin, Direct: 0.32 mg/dL (ref 0.00–0.40)
Total Protein: 7 g/dL (ref 6.0–8.5)

## 2021-05-05 LAB — TSH+T4F+T3FREE
Free T4: 0.74 ng/dL — ABNORMAL LOW (ref 0.82–1.77)
T3, Free: 2 pg/mL (ref 2.0–4.4)
TSH: 97.4 u[IU]/mL — ABNORMAL HIGH (ref 0.450–4.500)

## 2021-05-05 NOTE — Discharge Instructions (Addendum)
You were seen in the emergency department today for chest pain. ? ?As we discussed your work-up was very reassuring.  We did not see any evidence of infection or demand on your heart.  ? ?Continue to monitor how you're doing and return to the ER for new or worsening symptoms such as chest pain with exertion, worsening shortness of breath. ?

## 2021-05-05 NOTE — Progress Notes (Signed)
Let pt know that 2 out of 3 of his liver function tests that were elevated have returned to normal.  One remains elevated but much improved compared to when he was in the hospital in January.  Thyroid level remains abnormal with mild improvement from January.  Please confirm that he has been taking the Levothyroxine 75 mcg consistently /daily for the past 2 mths.  If so, we need to increase the dose to 100 mcg daily and have him return to the lab after being on the increase dose for 6 wks for recheck. Please let me know his response so I know whether to send the increase dose to his pharmacy.

## 2021-05-05 NOTE — ED Triage Notes (Signed)
Patient here POV from Home. ? ?Endorses, within an Hour PTA, a Shooting/Pulsating Pain in Mid Chest that was Severe. Pain has somewhat lessened but Patient endorses feeling "Off" and having Numbness in Bilateral Legs and a Headache. ? ?Mild Nausea. No Emesis. No Fevers. ? ?NAD Noted during Triage. A&Ox4. GCS 15. Ambulatory. ?
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
## 2021-05-06 ENCOUNTER — Other Ambulatory Visit: Payer: Self-pay

## 2021-05-06 ENCOUNTER — Telehealth: Payer: Self-pay

## 2021-05-06 MED ORDER — LEVOTHYROXINE SODIUM 100 MCG PO TABS: 100.0000 ug | ORAL_TABLET | Freq: Every day | ORAL | 1 refills | Status: DC

## 2021-05-06 NOTE — Telephone Encounter (Signed)
Contacted pt to go over lab results. Pt states he has been taking the Levothyroxine everyday. Pt states he had a feeling it was low because his energy started to decrease. ? ?Dr. Laural Benes I have sent in Levothyroxine per results note and pt has been scheduled for 6 week lab follow up ?

## 2021-05-18 ENCOUNTER — Encounter: Payer: Self-pay | Admitting: Internal Medicine

## 2021-05-18 ENCOUNTER — Ambulatory Visit: Payer: BC Managed Care – PPO | Attending: Internal Medicine | Admitting: Internal Medicine

## 2021-05-18 VITALS — BP 120/74 | HR 77 | Temp 98.3°F | Resp 16 | Wt 190.6 lb

## 2021-05-18 DIAGNOSIS — Z1211 Encounter for screening for malignant neoplasm of colon: Secondary | ICD-10-CM

## 2021-05-18 DIAGNOSIS — E039 Hypothyroidism, unspecified: Secondary | ICD-10-CM

## 2021-05-18 DIAGNOSIS — R0789 Other chest pain: Secondary | ICD-10-CM | POA: Diagnosis not present

## 2021-05-18 DIAGNOSIS — F330 Major depressive disorder, recurrent, mild: Secondary | ICD-10-CM | POA: Diagnosis not present

## 2021-05-18 DIAGNOSIS — E663 Overweight: Secondary | ICD-10-CM | POA: Diagnosis not present

## 2021-05-18 DIAGNOSIS — Z5320 Procedure and treatment not carried out because of patient's decision for unspecified reasons: Secondary | ICD-10-CM
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
## 2021-05-18 NOTE — Progress Notes (Signed)
Patient is c/o pull muscle on his right side. Patient said that it hurt when he takes a breath. Patient said it hurts to the touch 7/10 pain today ?

## 2021-05-18 NOTE — Patient Instructions (Signed)
Healthy Eating ?Following a healthy eating pattern may help you to achieve and maintain a healthy body weight, reduce the risk of chronic disease, and live a long and productive life. It is important to follow a healthy eating pattern at an appropriate calorie level for your body. Your nutritional needs should be met primarily through food by choosing a variety of nutrient-rich foods. ?What are tips for following this plan? ?Reading food labels ?Read labels and choose the following: ?Reduced or low sodium. ?Juices with 100% fruit juice. ?Foods with low saturated fats and high polyunsaturated and monounsaturated fats. ?Foods with whole grains, such as whole wheat, cracked wheat, brown rice, and wild rice. ?Whole grains that are fortified with folic acid. This is recommended for women who are pregnant or who want to become pregnant. ?Read labels and avoid the following: ?Foods with a lot of added sugars. These include foods that contain brown sugar, corn sweetener, corn syrup, dextrose, fructose, glucose, high-fructose corn syrup, honey, invert sugar, lactose, malt syrup, maltose, molasses, raw sugar, sucrose, trehalose, or turbinado sugar. ?Do not eat more than the following amounts of added sugar per day: ?6 teaspoons (25 g) for women. ?9 teaspoons (38 g) for men. ?Foods that contain processed or refined starches and grains. ?Refined grain products, such as white flour, degermed cornmeal, white bread, and white rice. ?Shopping ?Choose nutrient-rich snacks, such as vegetables, whole fruits, and nuts. Avoid high-calorie and high-sugar snacks, such as potato chips, fruit snacks, and candy. ?Use oil-based dressings and spreads on foods instead of solid fats such as butter, stick margarine, or cream cheese. ?Limit pre-made sauces, mixes, and "instant" products such as flavored rice, instant noodles, and ready-made pasta. ?Try more plant-protein sources, such as tofu, tempeh, black beans, edamame, lentils, nuts, and  seeds. ?Explore eating plans such as the Mediterranean diet or vegetarian diet. ?Cooking ?Use oil to saut? or stir-fry foods instead of solid fats such as butter, stick margarine, or lard. ?Try baking, boiling, grilling, or broiling instead of frying. ?Remove the fatty part of meats before cooking. ?Steam vegetables in water or broth. ?Meal planning ? ?At meals, imagine dividing your plate into fourths: ?One-half of your plate is fruits and vegetables. ?One-fourth of your plate is whole grains. ?One-fourth of your plate is protein, especially lean meats, poultry, eggs, tofu, beans, or nuts. ?Include low-fat dairy as part of your daily diet. ?Lifestyle ?Choose healthy options in all settings, including home, work, school, restaurants, or stores. ?Prepare your food safely: ?Wash your hands after handling raw meats. ?Keep food preparation surfaces clean by regularly washing with hot, soapy water. ?Keep raw meats separate from ready-to-eat foods, such as fruits and vegetables. ?Cook seafood, meat, poultry, and eggs to the recommended internal temperature. ?Store foods at safe temperatures. In general: ?Keep cold foods at 40?F (4.4?C) or below. ?Keep hot foods at 140?F (60?C) or above. ?Keep your freezer at 0?F (-17.8?C) or below. ?Foods are no longer safe to eat when they have been between the temperatures of 40?-140?F (4.4-60?C) for more than 2 hours. ?What foods should I eat? ?Fruits ?Aim to eat 2 cup-equivalents of fresh, canned (in natural juice), or frozen fruits each day. Examples of 1 cup-equivalent of fruit include 1 small apple, 8 large strawberries, 1 cup canned fruit, ? cup dried fruit, or 1 cup 100% juice. ?Vegetables ?Aim to eat 2?-3 cup-equivalents of fresh and frozen vegetables each day, including different varieties and colors. Examples of 1 cup-equivalent of vegetables include 2 medium carrots, 2 cups raw,  leafy greens, 1 cup chopped vegetable (raw or cooked), or 1 medium baked potato. ?Grains ?Aim to  eat 6 ounce-equivalents of whole grains each day. Examples of 1 ounce-equivalent of grains include 1 slice of bread, 1 cup ready-to-eat cereal, 3 cups popcorn, or ? cup cooked rice, pasta, or cereal. ?Meats and other proteins ?Aim to eat 5-6 ounce-equivalents of protein each day. Examples of 1 ounce-equivalent of protein include 1 egg, 1/2 cup nuts or seeds, or 1 tablespoon (16 g) peanut butter. A cut of meat or fish that is the size of a deck of cards is about 3-4 ounce-equivalents. ?Of the protein you eat each week, try to have at least 8 ounces come from seafood. This includes salmon, trout, herring, and anchovies. ?Dairy ?Aim to eat 3 cup-equivalents of fat-free or low-fat dairy each day. Examples of 1 cup-equivalent of dairy include 1 cup (240 mL) milk, 8 ounces (250 g) yogurt, 1? ounces (44 g) natural cheese, or 1 cup (240 mL) fortified soy milk. ?Fats and oils ?Aim for about 5 teaspoons (21 g) per day. Choose monounsaturated fats, such as canola and olive oils, avocados, peanut butter, and most nuts, or polyunsaturated fats, such as sunflower, corn, and soybean oils, walnuts, pine nuts, sesame seeds, sunflower seeds, and flaxseed. ?Beverages ?Aim for six 8-oz glasses of water per day. Limit coffee to three to five 8-oz cups per day. ?Limit caffeinated beverages that have added calories, such as soda and energy drinks. ?Limit alcohol intake to no more than 1 drink a day for nonpregnant women and 2 drinks a day for men. One drink equals 12 oz of beer (355 mL), 5 oz of wine (148 mL), or 1? oz of hard liquor (44 mL). ?Seasoning and other foods ?Avoid adding excess amounts of salt to your foods. Try flavoring foods with herbs and spices instead of salt. ?Avoid adding sugar to foods. ?Try using oil-based dressings, sauces, and spreads instead of solid fats. ?This information is based on general [place] nutrition guidelines. For more information, visit choosemyplate.gov. Exact amounts may vary based on your nutrition  needs. ?Summary ?A healthy eating plan may help you to maintain a healthy weight, reduce the risk of chronic diseases, and stay active throughout your life. ?Plan your meals. Make sure you eat the right portions of a variety of nutrient-rich foods. ?Try baking, boiling, grilling, or broiling instead of frying. ?Choose healthy options in all settings, including home, work, school, restaurants, or stores. ?This information is not intended to replace advice given to you by your health care provider. Make sure you discuss any questions you have with your health care provider. ?Document Revised: 08/26/2020 Document Reviewed: 08/26/2020 ?Elsevier Patient Education ? 2023 [employer] ? ?

## 2021-05-26 ENCOUNTER — Encounter (HOSPITAL_BASED_OUTPATIENT_CLINIC_OR_DEPARTMENT_OTHER): Payer: Self-pay | Admitting: Obstetrics and Gynecology

## 2021-05-26 ENCOUNTER — Other Ambulatory Visit: Payer: Self-pay

## 2021-05-26 ENCOUNTER — Emergency Department (HOSPITAL_BASED_OUTPATIENT_CLINIC_OR_DEPARTMENT_OTHER)
Admission: EM | Admit: 2021-05-26 | Discharge: 2021-05-26 | Discharge status: Against Medical Advice | Payer: BC Managed Care – PPO | Attending: Emergency Medicine | Admitting: Emergency Medicine

## 2021-05-26 DIAGNOSIS — R5383 Other fatigue: Secondary | ICD-10-CM | POA: Insufficient documentation

## 2021-05-26 DIAGNOSIS — M545 Low back pain, unspecified: Secondary | ICD-10-CM | POA: Insufficient documentation

## 2021-05-26 DIAGNOSIS — Z5321 Procedure and treatment not carried out due to patient leaving prior to being seen by health care provider: Secondary | ICD-10-CM | POA: Insufficient documentation

## 2021-05-26 LAB — CBC
HCT: 43.5 % (ref 39.0–52.0)
Hemoglobin: 14.4 g/dL (ref 13.0–17.0)
MCH: 32.4 pg (ref 26.0–34.0)
MCHC: 33.1 g/dL (ref 30.0–36.0)
MCV: 97.8 fL (ref 80.0–100.0)
Platelets: 219 10*3/uL (ref 150–400)
RBC: 4.45 MIL/uL (ref 4.22–5.81)
RDW: 13.4 % (ref 11.5–15.5)
WBC: 5.8 10*3/uL (ref 4.0–10.5)
nRBC: 0 % (ref 0.0–0.2)

## 2021-05-26 LAB — COMPREHENSIVE METABOLIC PANEL
ALT: 13 U/L (ref 0–44)
AST: 22 U/L (ref 15–41)
Albumin: 4.1 g/dL (ref 3.5–5.0)
Alkaline Phosphatase: 116 U/L (ref 38–126)
Anion gap: 15 (ref 5–15)
BUN: 9 mg/dL (ref 6–20)
CO2: 21 mmol/L — ABNORMAL LOW (ref 22–32)
Calcium: 8.9 mg/dL (ref 8.9–10.3)
Chloride: 100 mmol/L (ref 98–111)
Creatinine, Ser: 0.73 mg/dL (ref 0.61–1.24)
GFR, Estimated: >60 mL/min (ref >60)
Glucose, Bld: 128 mg/dL — ABNORMAL HIGH (ref 70–99)
Potassium: 3.7 mmol/L (ref 3.5–5.1)
Sodium: 136 mmol/L (ref 135–145)
Total Bilirubin: 0.7 mg/dL (ref 0.3–1.2)
Total Protein: 7.5 g/dL (ref 6.5–8.1)

## 2021-05-26 LAB — LIPASE, BLOOD: Lipase: 24 U/L (ref 11–51)

## 2021-05-26 NOTE — ED Triage Notes (Signed)
Patient reports to the ER for fatigue and feeling like he cannot sleep. Patient reports he slept on an air mattress for over a month and it affected his ability to sleep as well as caused back pain. Patient reports he has since bought a mattress. Patient reports every day he goes to work and feels like his "ribs are moving".  ?

## 2021-05-26 NOTE — ED Notes (Signed)
Pt told registration that he is leaving due to wait times  ?

## 2021-06-01 ENCOUNTER — Other Ambulatory Visit: Payer: Self-pay | Admitting: Internal Medicine

## 2021-06-02 NOTE — Telephone Encounter (Signed)
Rx 05/06/21 #30 [id number]-too soon Requested Prescriptions  Pending Prescriptions Disp Refills  . levothyroxine (SYNTHROID) 100 MCG tablet [Pharmacy Med Name: LEVOTHYROXINE 100 MCG TABLET] 30 tablet 1    Sig: TAKE 1 TABLET BY MOUTH DAILY BEFORE BREAKFAST.     Endocrinology:  Hypothyroid Agents Failed - 06/01/2021  2:34 PM      Failed - TSH in normal range and within 360 days    TSH  Date Value Ref Range Status  05/04/2021 97.400 (H) 0.450 - 4.500 uIU/mL Final         Passed - Valid encounter within last 12 months    Recent Outpatient Visits          2 weeks ago Acquired hypothyroidism   [facility] Marcine Matar, MD   2 months ago Establishing care with new doctor, encounter for   [facility] Marcine Matar, MD   4 years ago Acute right ankle pain   [facility], Gerald Stabs, PA-C      Future Appointments            In 3 months Laural Benes, Binnie Rail, MD [facility]

## 2021-06-17 ENCOUNTER — Ambulatory Visit: Payer: BC Managed Care – PPO | Attending: Internal Medicine

## 2021-06-17 DIAGNOSIS — E039 Hypothyroidism, unspecified: Secondary | ICD-10-CM

## 2021-06-18 ENCOUNTER — Telehealth: Payer: Self-pay | Admitting: Internal Medicine

## 2021-06-18 DIAGNOSIS — E039 Hypothyroidism, unspecified: Secondary | ICD-10-CM

## 2021-06-18 LAB — TSH: TSH: 53.3 u[IU]/mL — ABNORMAL HIGH (ref 0.450–4.500)

## 2021-06-18 MED ORDER — LEVOTHYROXINE SODIUM 125 MCG PO TABS: 125.0000 ug | ORAL_TABLET | Freq: Every day | ORAL | 4 refills | Status: DC

## 2021-06-18 NOTE — Telephone Encounter (Signed)
Phone call placed to patient this morning.  Patient informed that his thyroid level has improved but still not at goal.  He reports compliance with taking levothyroxine 100 mcg daily.  I recommend that we increase the dose to 125 mcg daily.  Return to the lab in 4 to 6 weeks to have TSH level rechecked.  Patient agreeable to plan.

## 2021-07-23 ENCOUNTER — Other Ambulatory Visit: Payer: Self-pay | Admitting: Internal Medicine

## 2021-08-14 ENCOUNTER — Other Ambulatory Visit: Payer: Self-pay

## 2021-08-14 ENCOUNTER — Emergency Department (HOSPITAL_BASED_OUTPATIENT_CLINIC_OR_DEPARTMENT_OTHER)
Admission: EM | Admit: 2021-08-14 | Discharge: 2021-08-15 | Disposition: A | Discharge status: Home / Self Care | Payer: BC Managed Care – PPO | Attending: Emergency Medicine | Admitting: Emergency Medicine

## 2021-08-14 DIAGNOSIS — X500XXA Overexertion from strenuous movement or load, initial encounter: Secondary | ICD-10-CM | POA: Diagnosis not present

## 2021-08-14 DIAGNOSIS — R0789 Other chest pain: Secondary | ICD-10-CM | POA: Diagnosis present

## 2021-08-14 DIAGNOSIS — J45909 Unspecified asthma, uncomplicated: Secondary | ICD-10-CM | POA: Diagnosis not present

## 2021-08-14 DIAGNOSIS — M549 Dorsalgia, unspecified: Secondary | ICD-10-CM | POA: Diagnosis not present

## 2021-08-14 DIAGNOSIS — Y990 Civilian activity done for income or pay: Secondary | ICD-10-CM | POA: Diagnosis not present

## 2021-08-14 NOTE — ED Triage Notes (Addendum)
Patient arrives ambulatory to triage with complaints of worsening back pain and left rib pain for a few months. Worsened last week.  Rates pain a 4/10. Worsens with movement.   States that he lifted something heavy last night and the pain worsened.

## 2021-08-15 ENCOUNTER — Emergency Department (HOSPITAL_BASED_OUTPATIENT_CLINIC_OR_DEPARTMENT_OTHER): Payer: BC Managed Care – PPO | Admitting: Radiology

## 2021-08-15 MED ORDER — IBUPROFEN 800 MG PO TABS
800.0000 mg | ORAL_TABLET | Freq: Once | ORAL | Status: AC
  Administered 2021-08-15: 800 mg via ORAL
  Filled 2021-08-15: qty 1

## 2021-08-15 MED ORDER — NAPROXEN 500 MG PO TABS: 500.0000 mg | ORAL_TABLET | Freq: Two times a day (BID) | ORAL | 0 refills | Status: DC

## 2021-08-15 NOTE — ED Provider Notes (Signed)
[facility] EMERGENCY DEPT Provider Note   CSN: [id number] Arrival date & time: 08/14/21  2011     History  Chief Complaint  Patient presents with   Back Pain   Muscle Pain    Danny West is a 46 y.o. male.  Patient is a 46 year old male with past medical history of asthma and depression.  Patient presenting today with complaints of left-sided chest wall pain.  This apparently has been ongoing for the past week since he had a change in his living arrangements and has been sleeping on a hard floor.  Today while he was at work, he lifted a box and felt a pop.  He has been having worsening pain since.  He denies any difficulty breathing and denies fevers, chills, or cough.  He has taken nothing for the pain yet.  The history is provided by the patient.       Home Medications Prior to Admission medications   Medication Sig Start Date End Date Taking? Authorizing Provider  albuterol (VENTOLIN HFA) 108 (90 Base) MCG/ACT inhaler Inhale 2 puffs into the lungs every 6 (six) hours as needed for wheezing or shortness of breath. 03/05/21   Marcine Matar, MD  fluticasone furoate-vilanterol (BREO ELLIPTA) 100-25 MCG/ACT AEPB Inhale 1 puff into the lungs daily as needed (For shortness of breath).    [provider]  levothyroxine (SYNTHROID) 125 MCG tablet TAKE 1 TABLET BY MOUTH DAILY BEFORE BREAKFAST. 07/23/21   Marcine Matar, MD  sertraline (ZOLOFT) 50 MG tablet Take 1 tablet (50 mg total) by mouth at bedtime. 03/05/21   Marcine Matar, MD      Allergies    Patient has no known allergies.    Review of Systems   Review of Systems  All other systems reviewed and are negative.   Physical Exam Updated Vital Signs BP 136/88 (BP Location: Right Arm)   Pulse 74   Temp 99.9 F (37.7 C) (Oral)   Resp 18   Ht 5\' 7"  (1.702 m)   Wt 78.9 kg   SpO2 99%   BMI 27.25 kg/m  Physical Exam Vitals and nursing note reviewed.  Constitutional:      General: He is  not in acute distress.    Appearance: He is well-developed. He is not diaphoretic.  HENT:     Head: Normocephalic and atraumatic.  Cardiovascular:     Rate and Rhythm: Normal rate and regular rhythm.     Heart sounds: No murmur heard.    No friction rub.  Pulmonary:     Effort: Pulmonary effort is normal. No respiratory distress.     Breath sounds: Normal breath sounds. No wheezing or rales.     Comments: There is tenderness to palpation over the lateral chest wall.  There is no crepitus or palpable abnormality.  Breath sounds are equal. Abdominal:     General: Bowel sounds are normal. There is no distension.     Palpations: Abdomen is soft.     Tenderness: There is no abdominal tenderness.  Musculoskeletal:        General: Normal range of motion.     Cervical back: Normal range of motion and neck supple.  Skin:    General: Skin is warm and dry.  Neurological:     Mental Status: He is alert and oriented to person, place, and time.     Coordination: Coordination normal.     ED Results / Procedures / Treatments   Labs (all labs  ordered are listed, but only abnormal results are displayed) Labs Reviewed - No data to display  EKG None  Radiology No results found.  Procedures Procedures    Medications Ordered in ED Medications  ibuprofen (ADVIL) tablet 800 mg (has no administration in time range)    ED Course/ Medical Decision Making/ A&P  Patient presenting with complaints of left-sided chest wall pain that seems musculoskeletal in nature.  His breath sounds are clear and x-rays show no evidence of rib fracture or pneumothorax.  He is tender to palpation in this area.  Patient given Motrin with some relief.  He will be discharged with naproxen and follow-up as needed.  Final Clinical Impression(s) / ED Diagnoses Final diagnoses:  None    Rx / DC Orders ED Discharge Orders     None         Geoffery Lyons, MD 08/15/21 0131

## 2021-08-15 NOTE — Discharge Instructions (Signed)
Begin taking naproxen as prescribed.  Rest.  Follow-up with primary doctor if not improving in the next week, and return to the ER if symptoms significantly worsen or change in the meantime.

## 2021-08-28 ENCOUNTER — Other Ambulatory Visit: Payer: Self-pay | Admitting: Internal Medicine

## 2021-09-13 ENCOUNTER — Ambulatory Visit (HOSPITAL_COMMUNITY)
Admission: EM | Admit: 2021-09-13 | Discharge: 2021-09-17 | Disposition: A | Discharge status: Home / Self Care | Payer: BC Managed Care – PPO | Attending: Psychiatry | Admitting: Psychiatry

## 2021-09-13 DIAGNOSIS — Z79899 Other long term (current) drug therapy: Secondary | ICD-10-CM | POA: Diagnosis not present

## 2021-09-13 DIAGNOSIS — R634 Abnormal weight loss: Secondary | ICD-10-CM | POA: Diagnosis not present

## 2021-09-13 DIAGNOSIS — F1020 Alcohol dependence, uncomplicated: Secondary | ICD-10-CM | POA: Insufficient documentation

## 2021-09-13 DIAGNOSIS — Z9151 Personal history of suicidal behavior: Secondary | ICD-10-CM | POA: Diagnosis not present

## 2021-09-13 DIAGNOSIS — F411 Generalized anxiety disorder: Secondary | ICD-10-CM | POA: Insufficient documentation

## 2021-09-13 DIAGNOSIS — Z20822 Contact with and (suspected) exposure to covid-19: Secondary | ICD-10-CM | POA: Insufficient documentation

## 2021-09-13 DIAGNOSIS — G4700 Insomnia, unspecified: Secondary | ICD-10-CM | POA: Insufficient documentation

## 2021-09-13 DIAGNOSIS — F332 Major depressive disorder, recurrent severe without psychotic features: Secondary | ICD-10-CM | POA: Diagnosis not present

## 2021-09-13 DIAGNOSIS — Z5986 Financial insecurity: Secondary | ICD-10-CM | POA: Diagnosis not present

## 2021-09-13 DIAGNOSIS — Z79890 Hormone replacement therapy (postmenopausal): Secondary | ICD-10-CM | POA: Insufficient documentation

## 2021-09-13 DIAGNOSIS — E039 Hypothyroidism, unspecified: Secondary | ICD-10-CM | POA: Diagnosis not present

## 2021-09-13 DIAGNOSIS — F1090 Alcohol use, unspecified, uncomplicated: Secondary | ICD-10-CM

## 2021-09-13 HISTORY — DX: Alcohol dependence, uncomplicated: F10.20

## 2021-09-13 LAB — CBC WITH DIFFERENTIAL/PLATELET
Abs Immature Granulocytes: 0.01 10*3/uL (ref 0.00–0.07)
Basophils Absolute: 0.1 10*3/uL (ref 0.0–0.1)
Basophils Relative: 2 %
Eosinophils Absolute: 0.1 10*3/uL (ref 0.0–0.5)
Eosinophils Relative: 2 %
HCT: 42.4 % (ref 39.0–52.0)
Hemoglobin: 15.2 g/dL (ref 13.0–17.0)
Immature Granulocytes: 0 %
Lymphocytes Relative: 31 %
Lymphs Abs: 1.6 10*3/uL (ref 0.7–4.0)
MCH: 32.8 pg (ref 26.0–34.0)
MCHC: 35.8 g/dL (ref 30.0–36.0)
MCV: 91.6 fL (ref 80.0–100.0)
Monocytes Absolute: 0.4 10*3/uL (ref 0.1–1.0)
Monocytes Relative: 8 %
Neutro Abs: 2.9 10*3/uL (ref 1.7–7.7)
Neutrophils Relative %: 57 %
Platelets: 167 10*3/uL (ref 150–400)
RBC: 4.63 MIL/uL (ref 4.22–5.81)
RDW: 12.4 % (ref 11.5–15.5)
WBC: 5.1 10*3/uL (ref 4.0–10.5)
nRBC: 0 % (ref 0.0–0.2)

## 2021-09-13 LAB — COMPREHENSIVE METABOLIC PANEL
ALT: 27 U/L (ref 0–44)
AST: 55 U/L — ABNORMAL HIGH (ref 15–41)
Albumin: 4 g/dL (ref 3.5–5.0)
Alkaline Phosphatase: 103 U/L (ref 38–126)
Anion gap: 11 (ref 5–15)
BUN: 8 mg/dL (ref 6–20)
CO2: 24 mmol/L (ref 22–32)
Calcium: 9 mg/dL (ref 8.9–10.3)
Chloride: 103 mmol/L (ref 98–111)
Creatinine, Ser: 0.64 mg/dL (ref 0.61–1.24)
GFR, Estimated: >60 mL/min (ref >60)
Glucose, Bld: 74 mg/dL (ref 70–99)
Potassium: 3.9 mmol/L (ref 3.5–5.1)
Sodium: 138 mmol/L (ref 135–145)
Total Bilirubin: 1.2 mg/dL (ref 0.3–1.2)
Total Protein: 7.1 g/dL (ref 6.5–8.1)

## 2021-09-13 LAB — LDL CHOLESTEROL, DIRECT: Direct LDL: 71 mg/dL (ref 0–99)

## 2021-09-13 LAB — POCT URINE DRUG SCREEN - MANUAL ENTRY (I-SCREEN)
POC Amphetamine UR: NOT DETECTED
POC Buprenorphine (BUP): NOT DETECTED
POC Cocaine UR: NOT DETECTED
POC Marijuana UR: NOT DETECTED
POC Methadone UR: NOT DETECTED
POC Methamphetamine UR: NOT DETECTED
POC Morphine: NOT DETECTED
POC Oxazepam (BZO): NOT DETECTED
POC Oxycodone UR: NOT DETECTED
POC Secobarbital (BAR): NOT DETECTED

## 2021-09-13 LAB — LIPID PANEL
Cholesterol: 226 mg/dL — ABNORMAL HIGH (ref 0–200)
HDL: 53 mg/dL (ref >40)
LDL Cholesterol: UNDETERMINED mg/dL (ref 0–99)
Total CHOL/HDL Ratio: 4.3 RATIO
Triglycerides: 718 mg/dL — ABNORMAL HIGH (ref <150)
VLDL: UNDETERMINED mg/dL (ref 0–40)

## 2021-09-13 LAB — TSH: TSH: 44.466 u[IU]/mL — ABNORMAL HIGH (ref 0.350–4.500)

## 2021-09-13 LAB — MAGNESIUM: Magnesium: 2.2 mg/dL (ref 1.7–2.4)

## 2021-09-13 LAB — HEMOGLOBIN A1C
Hgb A1c MFr Bld: 5.2 % (ref 4.8–5.6)
Mean Plasma Glucose: 102.54 mg/dL

## 2021-09-13 LAB — ETHANOL: Alcohol, Ethyl (B): 128 mg/dL — ABNORMAL HIGH (ref <10)

## 2021-09-13 LAB — RESP PANEL BY RT-PCR (FLU A&B, COVID) ARPGX2
Influenza A by PCR: NEGATIVE
Influenza B by PCR: NEGATIVE
SARS Coronavirus 2 by RT PCR: NEGATIVE

## 2021-09-13 LAB — POC SARS CORONAVIRUS 2 AG: SARSCOV2ONAVIRUS 2 AG: NEGATIVE

## 2021-09-13 MED ORDER — LORAZEPAM 1 MG PO TABS
1.0000 mg | ORAL_TABLET | Freq: Three times a day (TID) | ORAL | Status: AC
  Administered 2021-09-14 (×3): 1 mg via ORAL
  Filled 2021-09-13 (×3): qty 1

## 2021-09-13 MED ORDER — MAGNESIUM HYDROXIDE 400 MG/5ML PO SUSP: 30.0000 mL | Freq: Every day | ORAL | Status: DC | PRN

## 2021-09-13 MED ORDER — LORAZEPAM 1 MG PO TABS
1.0000 mg | ORAL_TABLET | Freq: Four times a day (QID) | ORAL | Status: AC
  Administered 2021-09-13 (×3): 1 mg via ORAL
  Filled 2021-09-13 (×3): qty 1

## 2021-09-13 MED ORDER — NICOTINE 21 MG/24HR TD PT24
21.0000 mg | MEDICATED_PATCH | Freq: Every day | TRANSDERMAL | Status: DC
Start: 2021-09-14 — End: 2021-09-17
  Administered 2021-09-14 – 2021-09-17 (×4): 21 mg via TRANSDERMAL
  Filled 2021-09-13 (×4): qty 1

## 2021-09-13 MED ORDER — ONDANSETRON 4 MG PO TBDP
4.0000 mg | ORAL_TABLET | Freq: Four times a day (QID) | ORAL | Status: AC | PRN
  Administered 2021-09-14: 4 mg via ORAL
  Filled 2021-09-13: qty 1

## 2021-09-13 MED ORDER — ACETAMINOPHEN 325 MG PO TABS
650.0000 mg | ORAL_TABLET | Freq: Four times a day (QID) | ORAL | Status: DC | PRN
  Filled 2021-09-13: qty 2

## 2021-09-13 MED ORDER — NICOTINE 21 MG/24HR TD PT24
21.0000 mg | MEDICATED_PATCH | Freq: Every day | TRANSDERMAL | Status: DC
Start: 2021-09-13 — End: 2021-09-13

## 2021-09-13 MED ORDER — ADULT MULTIVITAMIN W/MINERALS CH
1.0000 | ORAL_TABLET | Freq: Every day | ORAL | Status: DC
  Administered 2021-09-13 – 2021-09-17 (×5): 1 via ORAL
  Filled 2021-09-13 (×5): qty 1

## 2021-09-13 MED ORDER — LORAZEPAM 1 MG PO TABS
1.0000 mg | ORAL_TABLET | Freq: Every day | ORAL | Status: AC
  Administered 2021-09-16: 1 mg via ORAL
  Filled 2021-09-13: qty 1

## 2021-09-13 MED ORDER — ALUM & MAG HYDROXIDE-SIMETH 200-200-20 MG/5ML PO SUSP: 30.0000 mL | ORAL | Status: DC | PRN

## 2021-09-13 MED ORDER — LORAZEPAM 1 MG PO TABS: 1.0000 mg | ORAL_TABLET | Freq: Four times a day (QID) | ORAL | Status: AC | PRN

## 2021-09-13 MED ORDER — THIAMINE HCL 100 MG/ML IJ SOLN
100.0000 mg | Freq: Once | INTRAMUSCULAR | Status: AC
  Administered 2021-09-13: 100 mg via INTRAMUSCULAR
  Filled 2021-09-13: qty 2

## 2021-09-13 MED ORDER — LEVOTHYROXINE SODIUM 25 MCG PO TABS
125.0000 ug | ORAL_TABLET | Freq: Every day | ORAL | Status: DC
  Administered 2021-09-14 – 2021-09-17 (×4): 125 ug via ORAL
  Filled 2021-09-13 (×4): qty 1

## 2021-09-13 MED ORDER — HYDROXYZINE HCL 25 MG PO TABS: 25.0000 mg | ORAL_TABLET | Freq: Four times a day (QID) | ORAL | Status: AC | PRN

## 2021-09-13 MED ORDER — SERTRALINE HCL 100 MG PO TABS
100.0000 mg | ORAL_TABLET | Freq: Every day | ORAL | Status: DC
  Administered 2021-09-14: 100 mg via ORAL
  Filled 2021-09-13: qty 1

## 2021-09-13 MED ORDER — THIAMINE MONONITRATE 100 MG PO TABS
100.0000 mg | ORAL_TABLET | Freq: Every day | ORAL | Status: DC
  Administered 2021-09-14 – 2021-09-17 (×4): 100 mg via ORAL
  Filled 2021-09-13 (×4): qty 1

## 2021-09-13 MED ORDER — LOPERAMIDE HCL 2 MG PO CAPS: 2.0000 mg | ORAL_CAPSULE | ORAL | Status: AC | PRN

## 2021-09-13 MED ORDER — TRAZODONE HCL 50 MG PO TABS
50.0000 mg | ORAL_TABLET | Freq: Every evening | ORAL | Status: DC | PRN
  Administered 2021-09-13 – 2021-09-16 (×4): 50 mg via ORAL
  Filled 2021-09-13 (×4): qty 1

## 2021-09-13 MED ORDER — LORAZEPAM 1 MG PO TABS
1.0000 mg | ORAL_TABLET | Freq: Two times a day (BID) | ORAL | Status: AC
  Administered 2021-09-15 (×2): 1 mg via ORAL
  Filled 2021-09-13 (×2): qty 1

## 2021-09-13 NOTE — ED Notes (Signed)
Pt asleep in bed. Respirations even and unlabored. Monitoring for safety. 

## 2021-09-13 NOTE — ED Notes (Addendum)
Pt being admitted to [facility] requesting detox from ETOH. Denies SI/HI/AVH. Calm, cooperative throughout assessment. Pt states, "It's time and I'm ready to get my life back. No more and this time, I mean it. I have wonderful sons and an amazing family". Encouragement and support provided. Pt reports last drink was today prior to arriving to facility. Pt smell of ETOH when talking. Pt denies withdrawal sx at present. Informed pt of rules of unit. Pt compiant. Meal and drink offered. Informed pt to notify staff with any needs or concerns. Will monitor for safety.

## 2021-09-13 NOTE — ED Notes (Signed)
Pt sitting in dining room watching TV. A&O x4, calm and cooperative. Denies current SI/HI/AVH. No signs of distress noted. Monitoring for safety.  

## 2021-09-13 NOTE — ED Notes (Signed)
[facility] lab courier called to transport labs to [facility] lab 

## 2021-09-13 NOTE — ED Triage Notes (Signed)
Pt presents to [facility] voluntarily. Pt was referred by his provider at [facility] for detox. Pt states he has been on an alcohol bender for the past few days. Pt reports consuming 2-12oz beers and 1- 24oz twisted tea 1 hr ago. Pt appears to be under the influence.Pt reports hx of MDD and is prescribed 100mg  of zoloft daily, but states his provider told him he needs to detox in order for the medication to work properly. Pt denies SI/HI and AVH.
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
## 2021-09-13 NOTE — ED Notes (Signed)
Pt requested to start nicotine patch in the am due to it causing nightmares. Order frequency changed. Safety maintained.

## 2021-09-14 ENCOUNTER — Encounter (HOSPITAL_COMMUNITY): Payer: Self-pay | Admitting: Student

## 2021-09-14 DIAGNOSIS — Z9151 Personal history of suicidal behavior: Secondary | ICD-10-CM

## 2021-09-14 HISTORY — DX: Personal history of suicidal behavior: Z91.51

## 2021-09-14 LAB — RPR: RPR Ser Ql: NONREACTIVE

## 2021-09-14 MED ORDER — SERTRALINE HCL 100 MG PO TABS
100.0000 mg | ORAL_TABLET | Freq: Every day | ORAL | Status: DC
  Administered 2021-09-14 – 2021-09-16 (×3): 100 mg via ORAL
  Filled 2021-09-14 (×3): qty 1

## 2021-09-14 MED ORDER — NALTREXONE HCL 50 MG PO TABS
25.0000 mg | ORAL_TABLET | Freq: Every day | ORAL | Status: DC
  Administered 2021-09-14: 25 mg via ORAL
  Filled 2021-09-14: qty 1

## 2021-09-14 NOTE — ED Notes (Signed)
Pt asleep in bed. Respirations even and unlabored. Monitoring for safety. 

## 2021-09-14 NOTE — ED Notes (Signed)
Patient asleep in bed at this time without distress or complaint.

## 2021-09-14 NOTE — [facility] Group Notes (Signed)
LCSW Wellness Group Note   09/14/2021 1:00pm  Type of Group and Topic: Psychoeducational Group:  Wellness  Participation Level:  Active  Description of Group  Wellness group introduces the topic and its focus on developing healthy habits across the spectrum and its relationship to a decrease in hospital admissions.  Six areas of wellness are discussed: physical, social spiritual, intellectual, occupational, and emotional.  Patients are asked to consider their current wellness habits and to identify areas of wellness where they are interested and able to focus on improvements.    Therapeutic Goals Patients will understand components of wellness and how they can positively impact overall health.  Patients will identify areas of wellness where they have developed good habits. Patients will identify areas of wellness where they would like to make improvements.    Summary of Patient Progress: pt active participant in group discussion.  Pt identified social and intellectual as wellness areas of strength and identified financial as a wellness area that needs improvement.  Good overall participation.       Therapeutic Modalities: Cognitive Behavioral Therapy Psychoeducation    Lorri Frederick, LCSW

## 2021-09-14 NOTE — ED Notes (Signed)
Patient meeting with provider.

## 2021-09-14 NOTE — Discharge Instructions (Addendum)
Dear Onalee Hua,  Most effective treatment for your mental health disease involves BOTH a psychiatrist AND a therapist Psychiatrist to manage medications Therapist to help identify personal goals, barriers from those goals, and plan to achieve those goals by understanding emotions Please make regular appointments with an outpatient psychiatrist and other doctors once you leave the hospital (if any, otherwise, please see below for resources to make an appointment).  For therapy outside the hospital, please ask for these specific types of therapy: DBT ________________________________________________________  SAFETY  Dial 988 for National Suicide & Crisis Lifeline    Text [phone] for Crisis Text Line:     [facility]:  [address] FIRST FLOOR.  [place], [place] [address].  [phone]  Mobile Crisis Response Teams Listed by counties in vicinity of [facility] providers [facility] [phone] [facility] [phone] [facility] [phone] [facility] [phone] [place]                * [facility] [phone]                * [facility] [phone] [facility] [phone] [facility]  [phone] * [facility] [phone] ________________________________________________________  To see which pharmacy near you is the CHEAPEST for certain medications, please use GoodRx. It is free website and has a free phone app.    Also consider looking at [facility] $4.00 or [facility]'s $7.00 prescription list. Both are free to view if googled "[facility] $4 prescription" and "public's $7 prescription". These are set prices, no insurance required. [facility] low cost medications: $4-$15 for 30days prescriptions or  $10-$38 for 90days prescriptions  ________________________________________________________  For issues with sleep, please use this free app for insomnia called CBT-I. Let your doctors and therapists know so they can help with extra tips and tricks or for guidance and accountability. NO ADDS on the app.     ________________________________________________________  [facility] [address], SECOND FLOOR [place], [place] [address] [phone] OUTPATIENT Walk-in information: Please note, all walk-ins are first come & first serve, with limited number of availability.  Please note that to be eligible for services you must bring: ID or a piece of mail with your name [facility] address  Therapist for therapy:  Monday & Wednesdays: Please ARRIVE at 7:15 AM for registration Will START at 8:00 AM Every 1st & 2nd Friday of the month: Please ARRIVE at 10:15 AM for registration Will START at 1 PM - 5 PM  Psychiatrist for medication management: Monday - Friday:  Please ARRIVE at 7:15 AM for registration Will START at 8:00 AM  Regretfully, due to limited availability, please be aware that you may not been seen on the same day as walk-in. Please consider making an appoint or try again. Thank you for your patience and understanding.

## 2021-09-14 NOTE — Progress Notes (Signed)
CSW met with pt regarding DC plan.  Pt currently active with therapist Joycelyn Schmid at [facility].  Has standing appt on Thursdays at 2pm.  Discussed pt current use of ETOH.  Pt reports he drinks one twisted tea before his shift at [employer] and typically has 4 beers after work.  Pt is in agreement that he needs to stop drinking.  Discussed IOP program, pt does not want to pursue due to the time commitment.  Pt reports Joycelyn Schmid asked him to come to [facility] and the plan is to return to weekly therapy along with attending [place] meetings. Lurline Idol, MSW, LCSW 9/4/20232:06 PM

## 2021-09-14 NOTE — Progress Notes (Signed)
Patient attending social work group.

## 2021-09-14 NOTE — ED Notes (Signed)
Patient attended group with Child psychotherapist.

## 2021-09-14 NOTE — Progress Notes (Signed)
Patient pleasant and cooperative with care.  Initiates appropriate interactions with his peers.   He was compliant with medication regimen as ordered.

## 2021-09-14 NOTE — ED Provider Notes (Addendum)
Facility Based Crisis Behavioral Health Progress Note  Date and Time: 09/14/2021 12:51 PM Name: Danny West DOB: 10/20/1975  MRN:  [id number]  Diagnosis:  Final diagnoses:  Severe episode of recurrent major depressive disorder, without psychotic features (HCC)  Alcohol use disorder   Reason for presentation: Alcohol Problem and Addiction Problem  Brief HPI  Danny West is a 46 y.o. male, with PMH MDD, GAD, alcohol use d/o (no w/d hx), hypothyroidism (on synthroid) who presented Voluntary to [facility] (09/14/2021) as a walk-in alone for EtOH detox in the setting of missing phone call with children day prior and recommendation of counselor Thyra Breed at [facility] [place] 2 weeks prior.  Last drink 09/13/2021 1 hour prior to [facility] presentation.  Interval Hx  Last 24h: No acute behavioral concerns  Subjective:   Patient was initially seen ambulating around the unit, no acute distress.  Patient stated that he he is feeling better, although sleep and appetite are still poor.  Reported feeling "tired".  Denied EtOH craving at this time.   Denied feeling depressed or anxious at this time.  However reported having a suicide attempt in June 2023, where he took a handful of Zoloft and naltrexone with the intention to die. Stated that he wrote on his white board at his apartment that if he wakes up the next day, he needs to live for his children. Stated that that was his last suicidal ideation. Stated that he has been adherent to Zoloft 100 mg for the past few months, stated that it did take the edge off of his depression.    Stated that he has also been adherent to his Synthroid, which he takes on an empty stomach and waits about 30 minutes prior to eating.  Discussed with patient his elevated TSH, and encouraged to wait at least 1 hour before eating or drinking.   ROS   Mood:  ("tired") Sleep: Poor Appetite: Poor, improving  SI: No HI: No AVH: None   Review of  Systems  Respiratory:  Negative for shortness of breath.   Cardiovascular:  Negative for chest pain.  Gastrointestinal:  Negative for abdominal pain, nausea and vomiting.  Neurological:  Negative for tremors and headaches.    Psych ROS   Depression: depressed mood, insomnia, fatigue, feelings of worthlessness/guilt, difficulty concentrating, suicidal attempt (June 2023 - OD on zoloft and naltrexone, did not seek medical attention or told therapist), loss of energy/fatigue, decreased appetite with 15lb wt loss over 3 months) Duration of Depression: Greater than two weeks  Hypo-/Mania: Denied excessive energy with decreased need for sleep, persistent irritability, excessive spending, sexual indiscretion, hyperverbal/pressured speech, elated mood. Anxiety: Denied excessive worry or stress that is difficult to manage. Psychosis: Denied AVH Trauma: Denied h/o trauma, nightmares, flashbacks  Past History   Psychiatric History:  Dx: MDD, GAD, alcohol use d/o (no w/d hx), stimulant use d/o (vyvanse, adderal), self-reported ADHD Prior Rx: naltrexone 50 mg daily. Prozac in 1998, and Zoloft from 2019-January of 2022. Cytomel 25 mg daily Current OP psychiatrist: Denied Prior OP psychiatrist: Unsure Current OP therapist: Thyra Breed at [facility] [place] PCP: Denied Suicide Attempt: Yes, June 2023 - OD on handful of zoloft and naltrexone, wrote on his white board at his apartment that if he wakes up the next day, he needs to live for his children, did not seek medical attention or told therapist.  Inpatient psych: 01/24/2021 [facility] Violence/Aggression: Denied  Past Medical History:  Past Medical History:  Diagnosis Date   Alcohol use disorder, severe, dependence (HCC) 09/13/2021   Asthma    H/O suicide attempt 09/14/2021   June 2023 - "took handful of zoloft and naltrexone while home alone", wrote on whiteboard at home, "if you're alive tomorrow, you will live for your kids". Has not mentioned  this to anyone Academic librarian included), no psych hospitalization    Past Surgical History:  Procedure Laterality Date   NO PAST SURGERIES     Family Psychiatric History:  Suicide: Denied  Bipolar d/o: Father SCZ/ScZA: Denied Inpatient psych: Denied Substance use: EtOH abuse in mother and father  Family History: No family history on file.  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 Twisted tea x1 in AM & 14oz beer x4 after work. On weekends pt unclear how much but "a lot more" Tobacco: Denied Cannabis: Denied IVDU: Denied Opiates: Denied BZOs: Denied Others: Stimulant use (Adderall and Vyvanse), in remission Seizure: Denied Detox: Yes Residential: Denied Seizure/DT: Denied Social History   Substance and Sexual Activity  Alcohol Use Yes   Comment: drinks (4) 16 oz beers daily     Social History   Substance and Sexual Activity  Drug Use Never    Social History   Socioeconomic History   Marital status: Divorced    Spouse name: Not on file   Number of children: 2   Years of education: 12   Highest education level: Not on file  Occupational History   Not on file  Tobacco Use   Smoking status: Never   Smokeless tobacco: Current    Types: Snuff  Vaping Use   Vaping Use: Never used  Substance and Sexual Activity   Alcohol use: Yes    Comment: drinks (4) 16 oz beers daily   Drug use: Never   Sexual activity: Not Currently  Other Topics Concern   Not on file  Social History Narrative   Pt lives alone and works at [employer]. Divorced 11/2019; twin boys are 48 years old   Social Determinants of Health   Financial Resource Strain: Not on file  Food Insecurity: Not on file  Transportation Needs: Not on file  Physical Activity: Not on file  Stress: Not on file  Social Connections: Not on file    SDOH:  SDOH Screenings   Alcohol Screen:  Medium Risk (01/20/2021)  Depression (PHQ2-9): High Risk (09/13/2021)  Tobacco Use: High Risk (09/14/2021)   Additional Social History:    Pain Medications: see MAR Prescriptions: see MAR Over the Counter: see MAR History of alcohol / drug use?: Yes Longest period of sobriety (when/how long): No periods sobriety Negative Consequences of Use: Legal, Personal relationships, Work / School Withdrawal Symptoms: Sweats, Tremors, Weakness Name of Substance 1: Alcohol 1 - Age of First Use: 46 years old 1 - Amount (size/oz): intermittent binges that may last for weeks at a time 1 - Frequency: intermittent binges that may last for weeks at a time 1 - Duration: on-going 1 - Last Use / Amount: today; 2 twisted teas 1 - Method of Aquiring: self purchase 1- Route of Use: oral Name of Substance 2: THC 2 - Age of First Use: unknown 2 - Amount (size/oz): unknown 2 - Frequency: unknown 2 - Duration: unknown 2 - Last Use / Amount: 2 years ago 2 - Method of Aquiring: unknown 2 - Route of Substance Use: smoking  Name of Substance 3: Vyvanse/Adderal 3 - Age of First Use: 2012 3 - Amount (size/oz): unknown 3 - Frequency: unknown 3 - Duration: unknown 3 - Last Use / Amount: 2010 3 - Method of Aquiring: varies 3 - Route of Substance Use: oral              Current Medications   Current Facility-Administered Medications  Medication Dose Route Frequency Provider Last Rate Last Admin   acetaminophen (TYLENOL) tablet 650 mg  650 mg Oral Q6H PRN Ardis Hughs, NP       alum & mag hydroxide-simeth (MAALOX/MYLANTA) 200-200-20 MG/5ML suspension 30 mL  30 mL Oral Q4H PRN Ardis Hughs, NP       hydrOXYzine (ATARAX) tablet 25 mg  25 mg Oral Q6H PRN Ardis Hughs, NP       levothyroxine (SYNTHROID) tablet 125 mcg  125 mcg Oral Q0600 Ardis Hughs, NP   125 mcg at 09/14/21 0543   loperamide (IMODIUM) capsule 2-4 mg  2-4 mg Oral PRN Ardis Hughs, NP       LORazepam (ATIVAN) tablet 1  mg  1 mg Oral Q6H PRN Ardis Hughs, NP       LORazepam (ATIVAN) tablet 1 mg  1 mg Oral TID Ardis Hughs, NP   1 mg at 09/14/21 1004   Followed by   Melene Muller ON 09/15/2021] LORazepam (ATIVAN) tablet 1 mg  1 mg Oral BID Ardis Hughs, NP       Followed by   Melene Muller ON 09/16/2021] LORazepam (ATIVAN) tablet 1 mg  1 mg Oral Daily Ardis Hughs, NP       magnesium hydroxide (MILK OF MAGNESIA) suspension 30 mL  30 mL Oral Daily PRN Ardis Hughs, NP       multivitamin with minerals tablet 1 tablet  1 tablet Oral Daily Ardis Hughs, NP   1 tablet at 09/14/21 1004   naltrexone (DEPADE) tablet 25 mg  25 mg Oral Daily Princess Bruins, DO   25 mg at 09/14/21 1158   nicotine (NICODERM CQ - dosed in mg/24 hours) patch 21 mg  21 mg Transdermal Daily Vernard Gambles H, NP   21 mg at 09/14/21 1005   ondansetron (ZOFRAN-ODT) disintegrating tablet 4 mg  4 mg Oral Q6H PRN Ardis Hughs, NP       sertraline (ZOLOFT) tablet 100 mg  100 mg Oral QHS Ardis Hughs, NP       sertraline (ZOLOFT) tablet 100 mg  100 mg Oral Daily Princess Bruins, DO   100 mg at 09/14/21 1158   thiamine (VITAMIN B1) tablet 100 mg  100 mg Oral Daily Vernard Gambles H, NP   100 mg at 09/14/21 1005   traZODone (DESYREL) tablet 50 mg  50 mg Oral QHS PRN Ardis Hughs, NP   50 mg at 09/13/21 2114   Current Outpatient Medications  Medication Sig Dispense Refill   albuterol (VENTOLIN HFA) 108 (90 Base) MCG/ACT inhaler Inhale 2 puffs into the lungs every 6 (six) hours as needed for wheezing or shortness of breath. 8 g 6   levothyroxine (SYNTHROID) 125 MCG tablet TAKE 1 TABLET BY MOUTH EVERY DAY BEFORE BREAKFAST 30 tablet 0   naltrexone (DEPADE) 50 MG tablet Take 50 mg by mouth daily.     naproxen (NAPROSYN) 500 MG tablet Take 1 tablet (500 mg total) by mouth 2 (two) times daily. 20 tablet 0   NEURONTIN 300 MG capsule Take  300 mg by mouth 2 (two) times daily.     ZOLOFT 100 MG tablet Take 100 mg by mouth  daily.      Labs / Images  Lab Results:  Admission on 09/13/2021  Component Date Value Ref Range Status   SARS Coronavirus 2 by RT PCR 09/13/2021 NEGATIVE  NEGATIVE Final   Comment: (NOTE) SARS-CoV-2 target nucleic acids are NOT DETECTED.  The SARS-CoV-2 RNA is generally detectable in upper respiratory specimens during the acute phase of infection. The lowest concentration of SARS-CoV-2 viral copies this assay can detect is 138 copies/mL. A negative result does not preclude SARS-Cov-2 infection and should not be used as the sole basis for treatment or other patient management decisions. A negative result may occur with  improper specimen collection/handling, submission of specimen other than nasopharyngeal swab, presence of viral mutation(s) within the areas targeted by this assay, and inadequate number of viral copies(<138 copies/mL). A negative result must be combined with clinical observations, patient history, and epidemiological information. The expected result is Negative.  Fact Sheet for Patients:  BloggerCourse.com  Fact Sheet for Healthcare Providers:  SeriousBroker.it  This test is no                          t yet approved or cleared by the [place] FDA and  has been authorized for detection and/or diagnosis of SARS-CoV-2 by FDA under an Emergency Use Authorization (EUA). This EUA will remain  in effect (meaning this test can be used) for the duration of the COVID-19 declaration under Section 564(b)(1) of the Act, 21 U.S.C.section 360bbb-3(b)(1), unless the authorization is terminated  or revoked sooner.       Influenza A by PCR 09/13/2021 NEGATIVE  NEGATIVE Final   Influenza B by PCR 09/13/2021 NEGATIVE  NEGATIVE Final   Comment: (NOTE) The Xpert Xpress SARS-CoV-2/FLU/RSV plus assay is intended as an aid in the diagnosis of influenza from Nasopharyngeal swab specimens and should not be used as a sole basis  for treatment. Nasal washings and aspirates are unacceptable for Xpert Xpress SARS-CoV-2/FLU/RSV testing.  Fact Sheet for Patients: BloggerCourse.com  Fact Sheet for Healthcare Providers: SeriousBroker.it  This test is not yet approved or cleared by the [place] FDA and has been authorized for detection and/or diagnosis of SARS-CoV-2 by FDA under an Emergency Use Authorization (EUA). This EUA will remain in effect (meaning this test can be used) for the duration of the COVID-19 declaration under Section 564(b)(1) of the Act, 21 U.S.C. section 360bbb-3(b)(1), unless the authorization is terminated or revoked.  Performed at [facility], [address] [place], [place] [address]    RPR Ser Ql 09/13/2021 NON REACTIVE  NON REACTIVE Final   Performed at [facility], [address] [place], [place] [address]   TSH 09/13/2021 44.466 (H)  0.350 - 4.500 uIU/mL Final   Comment: Performed by a 3rd Generation assay with a functional sensitivity of <=0.01 uIU/mL. Performed at [facility], [address], [place], [place] [address]    Cholesterol 09/13/2021 226 (H)  0 - 200 mg/dL Final   Triglycerides 21/30/8657 718 (H)  <150 mg/dL Final   HDL 84/69/6295 53  >40 mg/dL Final   Total CHOL/HDL Ratio 09/13/2021 4.3  RATIO Final   VLDL 09/13/2021 UNABLE TO CALCULATE IF TRIGLYCERIDE OVER 400 mg/dL  0 - 40 mg/dL Final   LDL Cholesterol 09/13/2021 UNABLE TO CALCULATE IF TRIGLYCERIDE OVER 400 mg/dL  0 - 99 mg/dL Final   Comment:        Total Cholesterol/HDL:CHD Risk Coronary Heart Disease Risk Table                     Men   Women  1/2 Average Risk   3.4   3.3  Average Risk       5.0   4.4  2 X Average Risk   9.6   7.1  3 X Average Risk  23.4   11.0        Use the calculated Patient Ratio above and the CHD Risk Table to determine the patient's CHD Risk.        ATP III CLASSIFICATION (LDL):  <100     mg/dL   Optimal   462-703  mg/dL   Near or Above                    Optimal  130-159  mg/dL   Borderline  500-938  mg/dL   High  >182     mg/dL   Very High Performed at [facility], [address], [place], [place] [address]    Alcohol, Ethyl (B) 09/13/2021 128 (H)  <10 mg/dL Final   Comment: (NOTE) Lowest detectable limit for serum alcohol is 10 mg/dL.  For medical purposes only. Performed at [facility], [address], [place], [place] [address]    Magnesium 09/13/2021 2.2  1.7 - 2.4 mg/dL Final   Performed at [facility], [address] [place], [place] [address]   Hgb A1c MFr Bld 09/13/2021 5.2  4.8 - 5.6 % Final   Comment: (NOTE) Pre diabetes:          5.7%-6.4%  Diabetes:              >6.4%  Glycemic control for   <7.0% adults with diabetes    Mean Plasma Glucose 09/13/2021 102.54  mg/dL Final   Performed at [facility], [address] [place], [place] [address]   Sodium 09/13/2021 138  135 - 145 mmol/L Final   Potassium 09/13/2021 3.9  3.5 - 5.1 mmol/L Final   Chloride 09/13/2021 103  98 - 111 mmol/L Final   CO2 09/13/2021 24  22 - 32 mmol/L Final   Glucose, Bld 09/13/2021 74  70 - 99 mg/dL Final   Glucose reference range applies only to samples taken after fasting for at least 8 hours.   BUN 09/13/2021 8  6 - 20 mg/dL Final   Creatinine, Ser 09/13/2021 0.64  0.61 - 1.24 mg/dL Final   Calcium 25/85/2778 9.0  8.9 - 10.3 mg/dL Final   Total Protein 24/23/5361 7.1  6.5 - 8.1 g/dL Final   Albumin 44/31/5400 4.0  3.5 - 5.0 g/dL Final   AST 86/76/1950 55 (H)  15 - 41 U/L Final   ALT 09/13/2021 27  0 - 44 U/L Final   Alkaline Phosphatase 09/13/2021 103  38 - 126 U/L Final   Total Bilirubin 09/13/2021 1.2  0.3 - 1.2 mg/dL Final   GFR, Estimated 09/13/2021 >60  >60 mL/min Final   Comment: (NOTE) Calculated using the CKD-EPI Creatinine Equation (2021)    Anion gap 09/13/2021 11  5 - 15 Final   Performed at [facility], [address], [place],  [place] [address]   WBC 09/13/2021 5.1  4.0 - 10.5 K/uL Final   RBC 09/13/2021 4.63  4.22 - 5.81 MIL/uL Final   Hemoglobin 09/13/2021 15.2  13.0 - 17.0 g/dL Final   HCT 16/10/9602 42.4  39.0 - 52.0 % Final   MCV 09/13/2021 91.6  80.0 - 100.0 fL Final   MCH 09/13/2021 32.8  26.0 - 34.0 pg Final   MCHC 09/13/2021 35.8  30.0 - 36.0 g/dL Final   RDW 54/09/8117 12.4  11.5 - 15.5 % Final   Platelets 09/13/2021 167  150 - 400 K/uL Final   nRBC 09/13/2021 0.0  0.0 - 0.2 % Final   Neutrophils Relative % 09/13/2021 57  % Final   Neutro Abs 09/13/2021 2.9  1.7 - 7.7 K/uL Final   Lymphocytes Relative 09/13/2021 31  % Final   Lymphs Abs 09/13/2021 1.6  0.7 - 4.0 K/uL Final   Monocytes Relative 09/13/2021 8  % Final   Monocytes Absolute 09/13/2021 0.4  0.1 - 1.0 K/uL Final   Eosinophils Relative 09/13/2021 2  % Final   Eosinophils Absolute 09/13/2021 0.1  0.0 - 0.5 K/uL Final   Basophils Relative 09/13/2021 2  % Final   Basophils Absolute 09/13/2021 0.1  0.0 - 0.1 K/uL Final   Immature Granulocytes 09/13/2021 0  % Final   Abs Immature Granulocytes 09/13/2021 0.01  0.00 - 0.07 K/uL Final   Performed at [facility], [address] [place], [place] [address]   POC Amphetamine UR 09/13/2021 None Detected  NONE DETECTED (Cut Off Level 1000 ng/mL) Final   POC Secobarbital (BAR) 09/13/2021 None Detected  NONE DETECTED (Cut Off Level 300 ng/mL) Final   POC Buprenorphine (BUP) 09/13/2021 None Detected  NONE DETECTED (Cut Off Level 10 ng/mL) Final   POC Oxazepam (BZO) 09/13/2021 None Detected  NONE DETECTED (Cut Off Level 300 ng/mL) Final   POC Cocaine UR 09/13/2021 None Detected  NONE DETECTED (Cut Off Level 300 ng/mL) Final   POC Methamphetamine UR 09/13/2021 None Detected  NONE DETECTED (Cut Off Level 1000 ng/mL) Final   POC Morphine 09/13/2021 None Detected  NONE DETECTED (Cut Off Level 300 ng/mL) Final   POC Methadone UR 09/13/2021 None Detected  NONE DETECTED (Cut Off Level 300 ng/mL) Final   POC  Oxycodone UR 09/13/2021 None Detected  NONE DETECTED (Cut Off Level 100 ng/mL) Final   POC Marijuana UR 09/13/2021 None Detected  NONE DETECTED (Cut Off Level 50 ng/mL) Final   SARSCOV2ONAVIRUS 2 AG 09/13/2021 NEGATIVE  NEGATIVE Final   Comment: (NOTE) SARS-CoV-2 antigen NOT DETECTED.   Negative results are presumptive.  Negative results do not preclude SARS-CoV-2 infection and should not be used as the sole basis for treatment or other patient management decisions, including infection  control decisions, particularly in the presence of clinical signs and  symptoms consistent with COVID-19, or in those who have been in contact with the virus.  Negative results must be combined with clinical observations, patient history, and epidemiological information. The expected result is Negative.  Fact Sheet for Patients: https://www.jennings-kim.com/  Fact Sheet for Healthcare Providers: https://alexander-rogers.biz/  This test is not yet approved or cleared by the [place] FDA and  has been authorized for detection and/or diagnosis of SARS-CoV-2 by FDA under an Emergency Use Authorization (EUA).  This EUA will remain in effect (meaning this test can be used) for the duration of  the COV                          ID-19 declaration under Section 564(b)(1) of the  Act, 21 U.S.C. section 360bbb-3(b)(1), unless the authorization is terminated or revoked sooner.     Direct LDL 09/13/2021 71  0 - 99 mg/dL Final   Performed at [facility], [address], [place], [place] [address]  Appointment on 06/17/2021  Component Date Value Ref Range Status   TSH 06/17/2021 53.300 (H)  0.450 - 4.500 uIU/mL Final  Admission on 05/26/2021, Discharged on 05/26/2021  Component Date Value Ref Range Status   Lipase 05/26/2021 24  11 - 51 U/L Final   Performed at [facility], [address] [place], [place] [address]   Sodium 05/26/2021 136  135 - 145  mmol/L Final   Potassium 05/26/2021 3.7  3.5 - 5.1 mmol/L Final   Chloride 05/26/2021 100  98 - 111 mmol/L Final   CO2 05/26/2021 21 (L)  22 - 32 mmol/L Final   Glucose, Bld 05/26/2021 128 (H)  70 - 99 mg/dL Final   Glucose reference range applies only to samples taken after fasting for at least 8 hours.   BUN 05/26/2021 9  6 - 20 mg/dL Final   Creatinine, Ser 05/26/2021 0.73  0.61 - 1.24 mg/dL Final   Calcium 21/30/8657 8.9  8.9 - 10.3 mg/dL Final   Total Protein 84/69/6295 7.5  6.5 - 8.1 g/dL Final   Albumin 28/41/3244 4.1  3.5 - 5.0 g/dL Final   AST 01/13/7251 22  15 - 41 U/L Final   ALT 05/26/2021 13  0 - 44 U/L Final   Alkaline Phosphatase 05/26/2021 116  38 - 126 U/L Final   Total Bilirubin 05/26/2021 0.7  0.3 - 1.2 mg/dL Final   GFR, Estimated 05/26/2021 >60  >60 mL/min Final   Comment: (NOTE) Calculated using the CKD-EPI Creatinine Equation (2021)    Anion gap 05/26/2021 15  5 - 15 Final   Performed at [facility], [address] [place], [place] [address]   WBC 05/26/2021 5.8  4.0 - 10.5 K/uL Final   RBC 05/26/2021 4.45  4.22 - 5.81 MIL/uL Final   Hemoglobin 05/26/2021 14.4  13.0 - 17.0 g/dL Final   HCT 34/74/2595 43.5  39.0 - 52.0 % Final   MCV 05/26/2021 97.8  80.0 - 100.0 fL Final   MCH 05/26/2021 32.4  26.0 - 34.0 pg Final   MCHC 05/26/2021 33.1  30.0 - 36.0 g/dL Final   RDW 63/87/5643 13.4  11.5 - 15.5 % Final   Platelets 05/26/2021 219  150 - 400 K/uL Final   nRBC 05/26/2021 0.0  0.0 - 0.2 % Final   Performed at [facility], [address] [place], [place] [address]  Admission on 05/05/2021, Discharged on 05/05/2021  Component Date Value Ref Range Status   Sodium 05/05/2021 136  135 - 145 mmol/L Final   Potassium 05/05/2021 3.7  3.5 - 5.1 mmol/L Final   Chloride 05/05/2021 100  98 - 111 mmol/L Final   CO2 05/05/2021 22  22 - 32 mmol/L Final   Glucose, Bld 05/05/2021 137 (H)  70 - 99 mg/dL Final   Glucose reference range  applies only to samples taken after fasting for at least 8 hours.   BUN 05/05/2021 10  6 - 20 mg/dL Final   Creatinine, Ser 05/05/2021 0.76  0.61 - 1.24 mg/dL Final   Calcium 88/41/6606 9.4  8.9 - 10.3 mg/dL Final   GFR, Estimated 05/05/2021 >60  >60 mL/min Final   Comment: (NOTE) Calculated using the CKD-EPI Creatinine Equation (2021)  Anion gap 05/05/2021 14  5 - 15 Final   Performed at [facility], [address] [place], [place] [address]   WBC 05/05/2021 9.2  4.0 - 10.5 K/uL Final   RBC 05/05/2021 4.65  4.22 - 5.81 MIL/uL Final   Hemoglobin 05/05/2021 15.2  13.0 - 17.0 g/dL Final   HCT 11/04/8525 43.5  39.0 - 52.0 % Final   MCV 05/05/2021 93.5  80.0 - 100.0 fL Final   MCH 05/05/2021 32.7  26.0 - 34.0 pg Final   MCHC 05/05/2021 34.9  30.0 - 36.0 g/dL Final   RDW 78/24/2353 13.5  11.5 - 15.5 % Final   Platelets 05/05/2021 188  150 - 400 K/uL Final   nRBC 05/05/2021 0.0  0.0 - 0.2 % Final   Performed at [facility], [address] [place], [place] [address]   Troponin I (High Sensitivity) 05/05/2021 2  <18 ng/L Final   Comment: (NOTE) Elevated high sensitivity troponin I (hsTnI) values and significant  changes across serial measurements may suggest ACS but many other  chronic and acute conditions are known to elevate hsTnI results.  Refer to the "Links" section for chest pain algorithms and additional  guidance. Performed at [facility], [address] [place], [place] [address]   Lab on 05/04/2021  Component Date Value Ref Range Status   Total Protein 05/04/2021 7.0  6.0 - 8.5 g/dL Final   Albumin 86/76/1950 4.2  4.0 - 5.0 g/dL Final   Bilirubin Total 05/04/2021 0.4  0.0 - 1.2 mg/dL Final   Bilirubin, Direct 05/04/2021 0.32  0.00 - 0.40 mg/dL Final   Alkaline Phosphatase 05/04/2021 164 (H)  44 - 121 IU/L Final   AST 05/04/2021 21  0 - 40 IU/L Final   ALT 05/04/2021 10  0 - 44 IU/L Final   TSH 05/04/2021  97.400 (H)  0.450 - 4.500 uIU/mL Final   T3, Free 05/04/2021 2.0  2.0 - 4.4 pg/mL Final   Free T4 05/04/2021 0.74 (L)  0.82 - 1.77 ng/dL Final   Blood Alcohol level:  Lab Results  Component Value Date   ETH 128 (H) 09/13/2021   ETH 11 (H) 01/19/2021   Metabolic Disorder Labs: Lab Results  Component Value Date   HGBA1C 5.2 09/13/2021   MPG 102.54 09/13/2021   MPG 76.71 01/19/2021   No results found for: "PROLACTIN" Lab Results  Component Value Date   CHOL 226 (H) 09/13/2021   TRIG 718 (H) 09/13/2021   HDL 53 09/13/2021   CHOLHDL 4.3 09/13/2021   VLDL UNABLE TO CALCULATE IF TRIGLYCERIDE OVER 400 mg/dL 93/26/7124   LDLCALC UNABLE TO CALCULATE IF TRIGLYCERIDE OVER 400 mg/dL 58/09/9831   LDLCALC 825 (H) 01/22/2021   Therapeutic Lab Levels: No results found for: "LITHIUM" No results found for: "VALPROATE" No results found for: "CBMZ" Physical Findings   AIMS    Flowsheet Row [facility] INPATIENT ADULT 400B  AIMS Total Score 0      AUDIT    Flowsheet Row [facility]) [facility]  Alcohol Use Disorder Identification Test Final Score (AUDIT) 13      PHQ2-9    Flowsheet Row ED from 09/13/2021 in [facility] Office Visit from 05/18/2021 in [facility]  PHQ-2 Total Score 3 0  PHQ-9 Total Score 12 3      Flowsheet Row ED from 09/13/2021 in [place]  [facility] ED from 08/14/2021 in [facility] ED from 05/26/2021 in [facility]  C-SSRS RISK CATEGORY No Risk No Risk No Risk        Musculoskeletal  Strength & Muscle Tone: within normal limits Gait & Station: normal Patient leans: N/A   Psychiatric Specialty Exam  Presentation  General Appearance: Casual; Fairly Groomed  Eye Contact:Minimal (Mainly looking at hands and  wall)  Speech:Clear and Coherent; Normal Rate (Spontaneous)  Speech Volume:Normal  Handedness:Right   Mood and Affect  Mood:-- ("tired")  Affect:Congruent; Depressed; Constricted; Appropriate (Tearful when talking about kids)   Thought Process  Thought Processes:Goal Directed; Coherent; Linear  Descriptions of Associations:Intact   Thought Content Suicidal Thoughts:Suicidal Thoughts: No  Homicidal Thoughts:Homicidal Thoughts: No  Hallucinations:Hallucinations: None  Ideas of Reference:None  Thought Content:Rumination (Ruminated on missing call with kids, staying in work.)   Sensorium  Memory:Immediate Good; Recent Fair  Judgment:Fair  Insight:Shallow   Art therapist  Orientation:Full (Time, Place and Person)  Language:Good  Concentration:Good  Attention:Good  Recall:Good  Fund of Knowledge:Good   Psychomotor Activity  Psychomotor Activity:Psychomotor Activity: Psychomotor Retardation   Assets  Assets:Communication Skills; Desire for Improvement; Housing   Sleep  Sleep:Sleep: Poor Number of Hours of Sleep: 5   Nutritional Assessment (For [facility] and [facility] admissions only) Has the patient had a weight loss or gain of 10 pounds or more in the last 3 months?: Yes Has the patient had a decrease in food intake/or appetite?: Yes Does the patient have dental problems?: No Does the patient have eating habits or behaviors that may be indicators of an eating disorder including binging or inducing vomiting?: No Has the patient recently lost weight without trying?: 4 Has the patient been eating poorly because of a decreased appetite?: 1 Malnutrition Screening Tool Score: 5 Nutritional Assessment Referrals: Medication/Tx changes    Physical Exam  BP (!) 131/98   Pulse 79   Temp 98.5 F (36.9 C) (Tympanic)   Resp 18   SpO2 96%  Physical Exam Vitals and nursing note reviewed.  Constitutional:      General: He is not in acute distress.     Appearance: He is not ill-appearing, toxic-appearing or diaphoretic.  HENT:     Head: Normocephalic.  Pulmonary:     Effort: Pulmonary effort is normal. No respiratory distress.  Neurological:     Mental Status: He is alert.  Psychiatric:        Behavior: Behavior is cooperative.      Assessment / Plan  Danny West is a 46 y.o. male with PMH MDD, GAD, alcohol use d/o (no w/d hx), hypothyroidism (on synthroid) who presented Voluntary to [facility] (09/14/2021) as a walk-in alone for EtOH detox in the setting of missing phone call with children day prior and recommendation of counselor Thyra Breed at [facility] [place] 2 weeks prior.  Last drink 09/13/2021 1 hour prior to [facility] presentation.   AUD Pt has been drinking 1 twisted tea as in the AM + 14 oz beers x4 in the evening on weekdays, then copious amounts on the weekends (pt unclear how much). Has been drinking this amount since at least 46 years old. Denied h/o seizures or DT.  BAL 128, AST/ALT 55/27. Discussed CD-IOP/S-IOP and patient declined. Patient was on naltrexone 50 mg in the past, stated that it made him feel sleepy and dizzy.  We will start him on low-dose to assess for tolerance, with goal  to increase to 50 mg. Continued CIWA with ativan per protocol with thiamine, MV Continued ativan taper per protocol Started Naltrexone 25 mg daily  MDD Patient with 6 of 9 sxs, denied SI or difficulty with concentration. Last suicidal ideation June 2023, when pt attempted suicide by OD on zoloft and naltrexone. He did not did not seek medical help or tell therapist. Denied firearms. Continued to deny SI, stating that he is living for his sons. [facility] for SI with a plan, d/c'd on zoloft and cytomel. Currently on zoloft 100 mg daily, which he endorsed adherence for past few months. Increased zoloft 100 mg daily to 150 mg daily Recommend referral to OP  psychiatry  Hypothyroidism TSH ~ 44, FT4 ~ 0.7, reported adherence to home synthroid 125 mcg daily. Unclear if unintentional vs other reasoning for ineffectiveness of synthroid. Instructed patient to ensure to take it on empty stomach, first thing in the AM, then wait 1 hrs prior to eating for absorption. Was on cytomel 25 mg daily in the past, deferred to outpatient follow-up on restarting it, if needed. Continued synthroid 125 mcg daily Recommend referral and follow-up with PCP  Treatment Plan Summary: Daily contact with patient to assess and evaluate symptoms and progress in treatment and Medication management  Total Time spent with patient: 45 minutes  Signed: Princess BruinsJulie Tiffony Kite, DO Psychiatry Resident, PGY-2 09/14/2021, 12:51 PM   [facility] [address] [place], [place] [address] Dept: [phone] Dept Fax: [phone]

## 2021-09-15 ENCOUNTER — Encounter (HOSPITAL_COMMUNITY): Payer: Self-pay

## 2021-09-15 LAB — GC/CHLAMYDIA PROBE AMP (~~LOC~~) NOT AT ARMC
Chlamydia: NEGATIVE
Comment: NEGATIVE
Comment: NORMAL
Neisseria Gonorrhea: NEGATIVE

## 2021-09-15 MED ORDER — GABAPENTIN 300 MG PO CAPS
300.0000 mg | ORAL_CAPSULE | Freq: Three times a day (TID) | ORAL | Status: DC
  Administered 2021-09-15 – 2021-09-17 (×6): 300 mg via ORAL
  Filled 2021-09-15 (×6): qty 1

## 2021-09-15 MED ORDER — NALTREXONE HCL 50 MG PO TABS
50.0000 mg | ORAL_TABLET | Freq: Every day | ORAL | Status: DC
  Administered 2021-09-15 – 2021-09-17 (×3): 50 mg via ORAL
  Filled 2021-09-15 (×3): qty 1

## 2021-09-15 NOTE — Progress Notes (Signed)
Pt in his bed, breathing even and unlabored.  Patient has not been up and asked for any PRN medications thus far this shift.

## 2021-09-15 NOTE — ED Notes (Signed)
Patient is awake and alert on unit.  He is sitting in day room eating breakfast and watching the morning news.  Patient is calm and pleasant on approach.  No somatic distress or complaint at this time.Will monitor and provide a safe environment.

## 2021-09-15 NOTE — ED Notes (Signed)
Pt is in the bed sleeping. Respirations are even and unlabored. No acute distress noted. Will continue to monitor for safety. 

## 2021-09-15 NOTE — ED Notes (Signed)
Pt requested for trazodone to help him sleep. 

## 2021-09-15 NOTE — [facility] Group Notes (Signed)
LCSW Relapse Prevention and Social Support Group Note   09/15/2021 1330  Type of Group and Topic: Psychoeducational Group:  Relapse prevention and social support.   Participation Level:  Active  Description of Group  Relapse prevention and developing social support group identifies mental health triggers and early warning signs as a first step towards developing appropriate coping skills.  This can include a relapse of mental health or substance use symptoms.  With the help of examples, patients are encouraged to recognize and intervene when symptoms return before a crisis occurs.  Patients are also engaged to evaluate their current support network and to consider ways to increase and broaden that network.    Therapeutic Goals Patients will identify triggers and early symptoms related to both mental health and substance use relapses. Patients will begin the process of identifying plans/coping skills to manage these symptoms before they escalate to a crisis. Patients will consider individuals in their current support network, whether they are positive or negative supports, and look at options to increase the number of positive supports in their network.    Summary of Patient Progress: pt with excellent participation in group discussion, including appropriate comments to other group members.  .  Pt was able to identify feelings that can lead to relapse, particularly related to negative interactions with his father or "getting in my head" when he is alone in his apartment.  Pt was able to identify behaviors such as laying in bed all day that indicate potential danger for his ability to stay sober.   Pt identified his sister and some coworkers as positive supports in his life.  Pt also talked about previous interest in mountain biking and how this could be a positive, alcohol activity for him again in the future.      Therapeutic Modalities: Cognitive Behavioral Therapy Psychoeducation    Daleen Squibb, MSW, LCSW

## 2021-09-15 NOTE — ED Provider Notes (Cosign Needed Addendum)
Facility Based Crisis Behavioral Health Progress Note  Date and Time: 09/15/2021 8:05 PM Name: Danny West DOB: [birth year]  MRN:  [id number]  Diagnosis:  Final diagnoses:  Severe episode of recurrent major depressive disorder, without psychotic features (HCC)  Alcohol use disorder   Reason for presentation: Alcohol Problem and Addiction Problem  Brief HPI  Danny West is a 46 y.o. male, with PMH MDD, GAD, alcohol use d/o (no w/d hx), hypothyroidism (on synthroid) who presented Voluntary to [facility] (09/13/2021) as a walk-in alone for EtOH detox in the setting of missing phone call with children day prior and recommendation of counselor Thyra Breed at [facility] [place] 2 weeks prior.  Last drink 09/13/2021 1 hour prior to [facility] presentation.  Interval Hx  Last 24h: No acute behavioral concerns  Subjective:   Patient was initially seen ambulating around the unit, no acute distress.  Patient reported endorsed some EtOH craving today, and was able to describe techniques to cope with those feelings including walking, journal, reading his list of goals he made.  Reported difficulty falling and staying asleep, which was relieved by EtOH prior to admission. Denied nightmares or having to urinate in the middle to night. Informed patient of trazodone prn available. Appetite is still poor, but continues to improve. Reported feeling "tired" still, but getting better. Denied depressed mood, SI/HI/AVH. Contracted to safety. Is hopeful about sobriety. Requested being d/c'd on Thursday AM (9/7) to ensure he's not alone until his outpatient therapy appointment at 2pm.  Patient has no other questions or concerns, amenable to plan below.   ROS   Mood: Euthymic ("ok") Sleep: Poor (difficulty falling and staying asleep) Appetite: Poor, improving  SI: No HI: No AVH: None   Review of Systems  Constitutional:  Negative for diaphoresis.  Respiratory:  Negative for shortness of  breath.   Cardiovascular:  Negative for chest pain.  Gastrointestinal:  Negative for abdominal pain, nausea and vomiting.  Genitourinary:  Negative for frequency.  Neurological:  Positive for tremors. Negative for headaches.    Psych ROS   Depression: depressed mood, insomnia, fatigue, feelings of worthlessness/guilt, difficulty concentrating, suicidal attempt (June 2023 - OD on zoloft and naltrexone, did not seek medical attention or told therapist), loss of energy/fatigue, decreased appetite with 15lb wt loss over 3 months) Duration of Depression: Greater than two weeks  Hypo-/Mania: Denied excessive energy with decreased need for sleep, persistent irritability, excessive spending, sexual indiscretion, hyperverbal/pressured speech, elated mood. Anxiety: Denied excessive worry or stress that is difficult to manage. Psychosis: Denied AVH Trauma: Denied h/o trauma, nightmares, flashbacks  Past History   Psychiatric History:  Dx: MDD, GAD, alcohol use d/o (no w/d hx), stimulant use d/o (vyvanse, adderal), self-reported ADHD Prior Rx: naltrexone 50 mg daily. Prozac in 1998, and Zoloft from 2019-January of 2022. Cytomel 25 mg daily Current OP psychiatrist: Denied Prior OP psychiatrist: Unsure Current OP therapist: Thyra Breed at [facility] [place] PCP: Denied Suicide Attempt: Yes, June 2023 - OD on handful of zoloft and naltrexone, wrote on his white board at his apartment that if he wakes up the next day, he needs to live for his children, did not seek medical attention or told therapist.  Inpatient psych: 01/24/2021 [facility] Violence/Aggression: Denied  Past Medical History:  Past Medical History:  Diagnosis Date   Alcohol use disorder, severe, dependence (HCC) 09/13/2021   Asthma    H/O suicide attempt 09/14/2021   June 2023 - "took handful of zoloft and naltrexone  while home alone", wrote on whiteboard at home, "if you're alive tomorrow, you will live for your kids". Has not  mentioned this to anyone Academic librarian(counselor included), no psych hospitalization    Past Surgical History:  Procedure Laterality Date   NO PAST SURGERIES     Family Psychiatric History:  Suicide: Denied  Bipolar d/o: Father SCZ/ScZA: Denied Inpatient psych: Denied Substance use: EtOH abuse in mother and father  Family History: No family history on file.  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 Twisted tea x1 in AM & 14oz beer x4 after work. On weekends pt unclear how much but "a lot more" Tobacco: Denied Cannabis: Denied IVDU: Denied Opiates: Denied BZOs: Denied Others: Stimulant use (Adderall and Vyvanse), in remission Seizure: Denied Detox: Yes Residential: Denied Seizure/DT: Denied Social History   Substance and Sexual Activity  Alcohol Use Yes   Comment: drinks (4) 16 oz beers daily     Social History   Substance and Sexual Activity  Drug Use Never    Social History   Socioeconomic History   Marital status: Divorced    Spouse name: Not on file   Number of children: 2   Years of education: 12   Highest education level: Not on file  Occupational History   Not on file  Tobacco Use   Smoking status: Never   Smokeless tobacco: Current    Types: Snuff  Vaping Use   Vaping Use: Never used  Substance and Sexual Activity   Alcohol use: Yes    Comment: drinks (4) 16 oz beers daily   Drug use: Never   Sexual activity: Not Currently  Other Topics Concern   Not on file  Social History Narrative   Pt lives alone and works at [employer]. Divorced 11/2019; twin boys are 48 years old   Social Determinants of Health   Financial Resource Strain: Not on file  Food Insecurity: Not on file  Transportation Needs: Not on file  Physical Activity: Not on file  Stress: Not on file  Social Connections: Not on file    SDOH:  SDOH Screenings   Alcohol  Screen: Medium Risk (01/20/2021)  Depression (PHQ2-9): High Risk (09/13/2021)  Tobacco Use: High Risk (09/14/2021)   Additional Social History:    Pain Medications: see MAR Prescriptions: see MAR Over the Counter: see MAR History of alcohol / drug use?: Yes Longest period of sobriety (when/how long): No periods sobriety Negative Consequences of Use: Legal, Personal relationships, Work / School Withdrawal Symptoms: Sweats, Tremors, Weakness Name of Substance 1: Alcohol 1 - Age of First Use: 46 years old 1 - Amount (size/oz): intermittent binges that may last for weeks at a time 1 - Frequency: intermittent binges that may last for weeks at a time 1 - Duration: on-going 1 - Last Use / Amount: today; 2 twisted teas 1 - Method of Aquiring: self purchase 1- Route of Use: oral Name of Substance 2: THC 2 - Age of First Use: unknown 2 - Amount (size/oz): unknown 2 - Frequency: unknown 2 - Duration: unknown 2 - Last Use / Amount: 2 years ago 2 - Method of Aquiring: unknown 2 - Route of Substance Use: smoking Name of Substance 3: Vyvanse/Adderal 3 - Age of First Use: 2012 3 - Amount (size/oz): unknown 3 - Frequency: unknown 3 - Duration: unknown 3 - Last Use / Amount: 2010  3 - Method of Aquiring: varies 3 - Route of Substance Use: oral              Current Medications   Current Facility-Administered Medications  Medication Dose Route Frequency Provider Last Rate Last Admin   acetaminophen (TYLENOL) tablet 650 mg  650 mg Oral Q6H PRN Ardis Hughs, NP       alum & mag hydroxide-simeth (MAALOX/MYLANTA) 200-200-20 MG/5ML suspension 30 mL  30 mL Oral Q4H PRN Ardis Hughs, NP       gabapentin (NEURONTIN) capsule 300 mg  300 mg Oral TID Princess Bruins, DO   300 mg at 09/15/21 1539   hydrOXYzine (ATARAX) tablet 25 mg  25 mg Oral Q6H PRN Ardis Hughs, NP       levothyroxine (SYNTHROID) tablet 125 mcg  125 mcg Oral Q0600 Ardis Hughs, NP   125 mcg at 09/15/21 3790    loperamide (IMODIUM) capsule 2-4 mg  2-4 mg Oral PRN Ardis Hughs, NP       LORazepam (ATIVAN) tablet 1 mg  1 mg Oral Q6H PRN Ardis Hughs, NP       LORazepam (ATIVAN) tablet 1 mg  1 mg Oral BID Ardis Hughs, NP   1 mg at 09/15/21 0919   Followed by   Melene Muller ON 09/16/2021] LORazepam (ATIVAN) tablet 1 mg  1 mg Oral Daily Ardis Hughs, NP       magnesium hydroxide (MILK OF MAGNESIA) suspension 30 mL  30 mL Oral Daily PRN Ardis Hughs, NP       multivitamin with minerals tablet 1 tablet  1 tablet Oral Daily Ardis Hughs, NP   1 tablet at 09/15/21 0920   naltrexone (DEPADE) tablet 50 mg  50 mg Oral Daily Princess Bruins, DO   50 mg at 09/15/21 0919   nicotine (NICODERM CQ - dosed in mg/24 hours) patch 21 mg  21 mg Transdermal Daily Vernard Gambles H, NP   21 mg at 09/15/21 0919   ondansetron (ZOFRAN-ODT) disintegrating tablet 4 mg  4 mg Oral Q6H PRN Ardis Hughs, NP   4 mg at 09/14/21 2116   sertraline (ZOLOFT) tablet 100 mg  100 mg Oral Daily Princess Bruins, DO   100 mg at 09/15/21 0920   thiamine (VITAMIN B1) tablet 100 mg  100 mg Oral Daily Ardis Hughs, NP   100 mg at 09/15/21 2409   traZODone (DESYREL) tablet 50 mg  50 mg Oral QHS PRN Ardis Hughs, NP   50 mg at 09/14/21 2117   Current Outpatient Medications  Medication Sig Dispense Refill   albuterol (VENTOLIN HFA) 108 (90 Base) MCG/ACT inhaler Inhale 2 puffs into the lungs every 6 (six) hours as needed for wheezing or shortness of breath. 8 g 6   levothyroxine (SYNTHROID) 125 MCG tablet TAKE 1 TABLET BY MOUTH EVERY DAY BEFORE BREAKFAST 30 tablet 0   ZOLOFT 100 MG tablet Take 100 mg by mouth daily.      Labs / Images  Lab Results:  Admission on 09/13/2021  Component Date Value Ref Range Status   SARS Coronavirus 2 by RT PCR 09/13/2021 NEGATIVE  NEGATIVE Final   Comment: (NOTE) SARS-CoV-2 target nucleic acids are NOT DETECTED.  The SARS-CoV-2 RNA is generally detectable in upper  respiratory specimens during the acute phase of infection. The lowest concentration of SARS-CoV-2 viral copies this assay can detect is 138 copies/mL. A negative result does not preclude SARS-Cov-2 infection  and should not be used as the sole basis for treatment or other patient management decisions. A negative result may occur with  improper specimen collection/handling, submission of specimen other than nasopharyngeal swab, presence of viral mutation(s) within the areas targeted by this assay, and inadequate number of viral copies(<138 copies/mL). A negative result must be combined with clinical observations, patient history, and epidemiological information. The expected result is Negative.  Fact Sheet for Patients:  BloggerCourse.com  Fact Sheet for Healthcare Providers:  SeriousBroker.it  This test is no                          t yet approved or cleared by the [place] FDA and  has been authorized for detection and/or diagnosis of SARS-CoV-2 by FDA under an Emergency Use Authorization (EUA). This EUA will remain  in effect (meaning this test can be used) for the duration of the COVID-19 declaration under Section 564(b)(1) of the Act, 21 U.S.C.section 360bbb-3(b)(1), unless the authorization is terminated  or revoked sooner.       Influenza A by PCR 09/13/2021 NEGATIVE  NEGATIVE Final   Influenza B by PCR 09/13/2021 NEGATIVE  NEGATIVE Final   Comment: (NOTE) The Xpert Xpress SARS-CoV-2/FLU/RSV plus assay is intended as an aid in the diagnosis of influenza from Nasopharyngeal swab specimens and should not be used as a sole basis for treatment. Nasal washings and aspirates are unacceptable for Xpert Xpress SARS-CoV-2/FLU/RSV testing.  Fact Sheet for Patients: BloggerCourse.com  Fact Sheet for Healthcare Providers: SeriousBroker.it  This test is not yet approved or  cleared by the [place] FDA and has been authorized for detection and/or diagnosis of SARS-CoV-2 by FDA under an Emergency Use Authorization (EUA). This EUA will remain in effect (meaning this test can be used) for the duration of the COVID-19 declaration under Section 564(b)(1) of the Act, 21 U.S.C. section 360bbb-3(b)(1), unless the authorization is terminated or revoked.  Performed at [facility], [address], [place], [place] [address]    Neisseria Gonorrhea 09/13/2021 Negative   Final   Chlamydia 09/13/2021 Negative   Final   Comment 09/13/2021 Normal Reference Ranger Chlamydia - Negative   Final   Comment 09/13/2021 Normal Reference Range Neisseria Gonorrhea - Negative   Final   RPR Ser Ql 09/13/2021 NON REACTIVE  NON REACTIVE Final   Performed at [facility], [address], [place], [place] [address]   TSH 09/13/2021 44.466 (H)  0.350 - 4.500 uIU/mL Final   Comment: Performed by a 3rd Generation assay with a functional sensitivity of <=0.01 uIU/mL. Performed at [facility], [address], [place], [place] [address]    Cholesterol 09/13/2021 226 (H)  0 - 200 mg/dL Final   Triglycerides 16/38/4665 718 (H)  <150 mg/dL Final   HDL 99/35/7017 53  >40 mg/dL Final   Total CHOL/HDL Ratio 09/13/2021 4.3  RATIO Final   VLDL 09/13/2021 UNABLE TO CALCULATE IF TRIGLYCERIDE OVER 400 mg/dL  0 - 40 mg/dL Final   LDL Cholesterol 09/13/2021 UNABLE TO CALCULATE IF TRIGLYCERIDE OVER 400 mg/dL  0 - 99 mg/dL Final   Comment:        Total Cholesterol/HDL:CHD Risk Coronary Heart Disease Risk Table                     Men   Women  1/2 Average Risk   3.4   3.3  Average Risk  5.0   4.4  2 X Average Risk   9.6   7.1  3 X Average Risk  23.4   11.0        Use the calculated Patient Ratio above and the CHD Risk Table to determine the patient's CHD Risk.        ATP III CLASSIFICATION (LDL):  <100     mg/dL   Optimal  045-409  mg/dL   Near or Above                     Optimal  130-159  mg/dL   Borderline  811-914  mg/dL   High  >782     mg/dL   Very High Performed at [facility], [address], [place], [place] [address]    Alcohol, Ethyl (B) 09/13/2021 128 (H)  <10 mg/dL Final   Comment: (NOTE) Lowest detectable limit for serum alcohol is 10 mg/dL.  For medical purposes only. Performed at [facility], [address] [place], [place] [address]    Magnesium 09/13/2021 2.2  1.7 - 2.4 mg/dL Final   Performed at [facility], [address] [place], [place] [address]   Hgb A1c MFr Bld 09/13/2021 5.2  4.8 - 5.6 % Final   Comment: (NOTE) Pre diabetes:          5.7%-6.4%  Diabetes:              >6.4%  Glycemic control for   <7.0% adults with diabetes    Mean Plasma Glucose 09/13/2021 102.54  mg/dL Final   Performed at [facility], [address], [place], [place] [address]   Sodium 09/13/2021 138  135 - 145 mmol/L Final   Potassium 09/13/2021 3.9  3.5 - 5.1 mmol/L Final   Chloride 09/13/2021 103  98 - 111 mmol/L Final   CO2 09/13/2021 24  22 - 32 mmol/L Final   Glucose, Bld 09/13/2021 74  70 - 99 mg/dL Final   Glucose reference range applies only to samples taken after fasting for at least 8 hours.   BUN 09/13/2021 8  6 - 20 mg/dL Final   Creatinine, Ser 09/13/2021 0.64  0.61 - 1.24 mg/dL Final   Calcium 84/13/2440 9.0  8.9 - 10.3 mg/dL Final   Total Protein 11/07/2534 7.1  6.5 - 8.1 g/dL Final   Albumin 64/40/3474 4.0  3.5 - 5.0 g/dL Final   AST 25/95/6387 55 (H)  15 - 41 U/L Final   ALT 09/13/2021 27  0 - 44 U/L Final   Alkaline Phosphatase 09/13/2021 103  38 - 126 U/L Final   Total Bilirubin 09/13/2021 1.2  0.3 - 1.2 mg/dL Final   GFR, Estimated 09/13/2021 >60  >60 mL/min Final   Comment: (NOTE) Calculated using the CKD-EPI Creatinine Equation (2021)    Anion gap 09/13/2021 11  5 - 15 Final   Performed at [facility], [address], [place], [place] [address]   WBC 09/13/2021 5.1  4.0 - 10.5 K/uL  Final   RBC 09/13/2021 4.63  4.22 - 5.81 MIL/uL Final   Hemoglobin 09/13/2021 15.2  13.0 - 17.0 g/dL Final   HCT 29/51/8841 42.4  39.0 - 52.0 % Final   MCV 09/13/2021 91.6  80.0 - 100.0 fL Final   MCH 09/13/2021 32.8  26.0 - 34.0 pg Final   MCHC 09/13/2021 35.8  30.0 - 36.0 g/dL Final   RDW 66/06/3014 12.4  11.5 - 15.5 % Final   Platelets 09/13/2021 167  150 - 400 K/uL Final   nRBC 09/13/2021 0.0  0.0 - 0.2 % Final   Neutrophils Relative % 09/13/2021 57  % Final   Neutro Abs 09/13/2021 2.9  1.7 - 7.7 K/uL Final   Lymphocytes Relative 09/13/2021 31  % Final   Lymphs Abs 09/13/2021 1.6  0.7 - 4.0 K/uL Final   Monocytes Relative 09/13/2021 8  % Final   Monocytes Absolute 09/13/2021 0.4  0.1 - 1.0 K/uL Final   Eosinophils Relative 09/13/2021 2  % Final   Eosinophils Absolute 09/13/2021 0.1  0.0 - 0.5 K/uL Final   Basophils Relative 09/13/2021 2  % Final   Basophils Absolute 09/13/2021 0.1  0.0 - 0.1 K/uL Final   Immature Granulocytes 09/13/2021 0  % Final   Abs Immature Granulocytes 09/13/2021 0.01  0.00 - 0.07 K/uL Final   Performed at [facility], [address], [place], [place] [address]   POC Amphetamine UR 09/13/2021 None Detected  NONE DETECTED (Cut Off Level 1000 ng/mL) Final   POC Secobarbital (BAR) 09/13/2021 None Detected  NONE DETECTED (Cut Off Level 300 ng/mL) Final   POC Buprenorphine (BUP) 09/13/2021 None Detected  NONE DETECTED (Cut Off Level 10 ng/mL) Final   POC Oxazepam (BZO) 09/13/2021 None Detected  NONE DETECTED (Cut Off Level 300 ng/mL) Final   POC Cocaine UR 09/13/2021 None Detected  NONE DETECTED (Cut Off Level 300 ng/mL) Final   POC Methamphetamine UR 09/13/2021 None Detected  NONE DETECTED (Cut Off Level 1000 ng/mL) Final   POC Morphine 09/13/2021 None Detected  NONE DETECTED (Cut Off Level 300 ng/mL) Final   POC Methadone UR 09/13/2021 None Detected  NONE DETECTED (Cut Off Level 300 ng/mL) Final   POC Oxycodone UR 09/13/2021 None Detected  NONE DETECTED  (Cut Off Level 100 ng/mL) Final   POC Marijuana UR 09/13/2021 None Detected  NONE DETECTED (Cut Off Level 50 ng/mL) Final   SARSCOV2ONAVIRUS 2 AG 09/13/2021 NEGATIVE  NEGATIVE Final   Comment: (NOTE) SARS-CoV-2 antigen NOT DETECTED.   Negative results are presumptive.  Negative results do not preclude SARS-CoV-2 infection and should not be used as the sole basis for treatment or other patient management decisions, including infection  control decisions, particularly in the presence of clinical signs and  symptoms consistent with COVID-19, or in those who have been in contact with the virus.  Negative results must be combined with clinical observations, patient history, and epidemiological information. The expected result is Negative.  Fact Sheet for Patients: https://www.jennings-kim.com/  Fact Sheet for Healthcare Providers: https://alexander-rogers.biz/  This test is not yet approved or cleared by the [place] FDA and  has been authorized for detection and/or diagnosis of SARS-CoV-2 by FDA under an Emergency Use Authorization (EUA).  This EUA will remain in effect (meaning this test can be used) for the duration of  the COV                          ID-19 declaration under Section 564(b)(1) of the Act, 21 U.S.C. section 360bbb-3(b)(1), unless the authorization is terminated or revoked sooner.     Direct LDL 09/13/2021 71  0 - 99 mg/dL Final   Performed at [facility], [address] [place], [place] [address]  Appointment on 06/17/2021  Component Date Value Ref Range Status   TSH 06/17/2021 53.300 (H)  0.450 - 4.500 uIU/mL Final  Admission on 05/26/2021, Discharged on 05/26/2021  Component Date Value Ref Range Status   Lipase 05/26/2021 24  11 - 51 U/L Final   Performed at [facility], [address], [place], [place] [address]   Sodium 05/26/2021 136  135 - 145 mmol/L Final   Potassium 05/26/2021 3.7  3.5 - 5.1 mmol/L  Final   Chloride 05/26/2021 100  98 - 111 mmol/L Final   CO2 05/26/2021 21 (L)  22 - 32 mmol/L Final   Glucose, Bld 05/26/2021 128 (H)  70 - 99 mg/dL Final   Glucose reference range applies only to samples taken after fasting for at least 8 hours.   BUN 05/26/2021 9  6 - 20 mg/dL Final   Creatinine, Ser 05/26/2021 0.73  0.61 - 1.24 mg/dL Final   Calcium 91/47/8295 8.9  8.9 - 10.3 mg/dL Final   Total Protein 62/13/0865 7.5  6.5 - 8.1 g/dL Final   Albumin 78/46/9629 4.1  3.5 - 5.0 g/dL Final   AST 52/84/1324 22  15 - 41 U/L Final   ALT 05/26/2021 13  0 - 44 U/L Final   Alkaline Phosphatase 05/26/2021 116  38 - 126 U/L Final   Total Bilirubin 05/26/2021 0.7  0.3 - 1.2 mg/dL Final   GFR, Estimated 05/26/2021 >60  >60 mL/min Final   Comment: (NOTE) Calculated using the CKD-EPI Creatinine Equation (2021)    Anion gap 05/26/2021 15  5 - 15 Final   Performed at [facility], [address] [place], [place] [address]   WBC 05/26/2021 5.8  4.0 - 10.5 K/uL Final   RBC 05/26/2021 4.45  4.22 - 5.81 MIL/uL Final   Hemoglobin 05/26/2021 14.4  13.0 - 17.0 g/dL Final   HCT 72/53/6644 43.5  39.0 - 52.0 % Final   MCV 05/26/2021 97.8  80.0 - 100.0 fL Final   MCH 05/26/2021 32.4  26.0 - 34.0 pg Final   MCHC 05/26/2021 33.1  30.0 - 36.0 g/dL Final   RDW 03/47/4259 13.4  11.5 - 15.5 % Final   Platelets 05/26/2021 219  150 - 400 K/uL Final   nRBC 05/26/2021 0.0  0.0 - 0.2 % Final   Performed at [facility], [address], [place], [place] [address]  Admission on 05/05/2021, Discharged on 05/05/2021  Component Date Value Ref Range Status   Sodium 05/05/2021 136  135 - 145 mmol/L Final   Potassium 05/05/2021 3.7  3.5 - 5.1 mmol/L Final   Chloride 05/05/2021 100  98 - 111 mmol/L Final   CO2 05/05/2021 22  22 - 32 mmol/L Final   Glucose, Bld 05/05/2021 137 (H)  70 - 99 mg/dL Final   Glucose reference range applies only to samples taken after fasting for at least 8  hours.   BUN 05/05/2021 10  6 - 20 mg/dL Final   Creatinine, Ser 05/05/2021 0.76  0.61 - 1.24 mg/dL Final   Calcium 56/43/3295 9.4  8.9 - 10.3 mg/dL Final   GFR, Estimated 05/05/2021 >60  >60 mL/min Final   Comment: (NOTE) Calculated using the CKD-EPI Creatinine Equation (2021)    Anion gap 05/05/2021 14  5 - 15 Final   Performed at [facility], [address], [place], [place] [address]   WBC 05/05/2021 9.2  4.0 - 10.5 K/uL Final   RBC 05/05/2021 4.65  4.22 - 5.81 MIL/uL Final   Hemoglobin 05/05/2021 15.2  13.0 - 17.0 g/dL Final   HCT 66/06/3014 43.5  39.0 - 52.0 %  Final   MCV 05/05/2021 93.5  80.0 - 100.0 fL Final   MCH 05/05/2021 32.7  26.0 - 34.0 pg Final   MCHC 05/05/2021 34.9  30.0 - 36.0 g/dL Final   RDW 58/52/7782 13.5  11.5 - 15.5 % Final   Platelets 05/05/2021 188  150 - 400 K/uL Final   nRBC 05/05/2021 0.0  0.0 - 0.2 % Final   Performed at [facility], [address] [place], [place] [address]   Troponin I (High Sensitivity) 05/05/2021 2  <18 ng/L Final   Comment: (NOTE) Elevated high sensitivity troponin I (hsTnI) values and significant  changes across serial measurements may suggest ACS but many other  chronic and acute conditions are known to elevate hsTnI results.  Refer to the "Links" section for chest pain algorithms and additional  guidance. Performed at [facility], [address] [place], [place] [address]   Lab on 05/04/2021  Component Date Value Ref Range Status   Total Protein 05/04/2021 7.0  6.0 - 8.5 g/dL Final   Albumin 15/40/0867 4.2  4.0 - 5.0 g/dL Final   Bilirubin Total 05/04/2021 0.4  0.0 - 1.2 mg/dL Final   Bilirubin, Direct 05/04/2021 0.32  0.00 - 0.40 mg/dL Final   Alkaline Phosphatase 05/04/2021 164 (H)  44 - 121 IU/L Final   AST 05/04/2021 21  0 - 40 IU/L Final   ALT 05/04/2021 10  0 - 44 IU/L Final   TSH 05/04/2021 97.400 (H)  0.450 - 4.500 uIU/mL Final   T3, Free 05/04/2021  2.0  2.0 - 4.4 pg/mL Final   Free T4 05/04/2021 0.74 (L)  0.82 - 1.77 ng/dL Final   Blood Alcohol level:  Lab Results  Component Value Date   ETH 128 (H) 09/13/2021   ETH 11 (H) 01/19/2021   Metabolic Disorder Labs: Lab Results  Component Value Date   HGBA1C 5.2 09/13/2021   MPG 102.54 09/13/2021   MPG 76.71 01/19/2021   No results found for: "PROLACTIN" Lab Results  Component Value Date   CHOL 226 (H) 09/13/2021   TRIG 718 (H) 09/13/2021   HDL 53 09/13/2021   CHOLHDL 4.3 09/13/2021   VLDL UNABLE TO CALCULATE IF TRIGLYCERIDE OVER 400 mg/dL 61/95/0932   LDLCALC UNABLE TO CALCULATE IF TRIGLYCERIDE OVER 400 mg/dL 67/12/4578   LDLCALC 998 (H) 01/22/2021   Therapeutic Lab Levels: No results found for: "LITHIUM" No results found for: "VALPROATE" No results found for: "CBMZ" Physical Findings   AIMS    Flowsheet Row [facility]) [facility]  AIMS Total Score 0      AUDIT    Flowsheet Row [facility]) [facility]  Alcohol Use Disorder Identification Test Final Score (AUDIT) 13      PHQ2-9    Flowsheet Row ED from 09/13/2021 in [facility] Office Visit from 05/18/2021 in [facility]  PHQ-2 Total Score 3 0  PHQ-9 Total Score 12 3      Flowsheet Row ED from 09/13/2021 in [facility] ED from 08/14/2021 in [facility] ED from 05/26/2021 in [facility]  C-SSRS RISK CATEGORY No Risk No Risk No Risk        Musculoskeletal  Strength & Muscle Tone: within normal limits Gait & Station: normal Patient leans: N/A   Psychiatric Specialty Exam  Presentation  General Appearance: Casual; Fairly Groomed;  Appropriate for Environment  Eye Contact:Good  Speech:Clear and Coherent; Normal Rate (Spontaneous)  Speech  Volume:Normal  Handedness:Right   Mood and Affect  Mood:Euthymic ("ok")  Affect:Congruent; Appropriate; Full Range   Thought Process  Thought Processes:Goal Directed; Coherent; Linear  Descriptions of Associations:Intact   Thought Content Suicidal Thoughts:Suicidal Thoughts: No  Homicidal Thoughts:Homicidal Thoughts: No  Hallucinations:Hallucinations: None  Ideas of Reference:None  Thought Content:WDL; Logical   Sensorium  Memory:Immediate Good  Judgment:Fair  Insight:Fair   Executive Functions  Orientation:Full (Time, Place and Person)  Language:Good  Concentration:Good  Attention:Good  Recall:Good  Fund of Knowledge:Good   Psychomotor Activity  Psychomotor Activity:Psychomotor Activity: Tremor (fine kinetic tremor)   Assets  Assets:Communication Skills; Desire for Improvement; Housing; Social Support   Sleep  Sleep:Sleep: Poor (difficulty falling and staying asleep)   Physical Exam  BP 110/75 (BP Location: Right Arm)   Pulse 81   Temp 98.6 F (37 C) (Oral)   Resp 18   SpO2 98%  Physical Exam Vitals and nursing note reviewed.  Constitutional:      General: He is not in acute distress.    Appearance: He is not ill-appearing, toxic-appearing or diaphoretic.  HENT:     Head: Normocephalic.  Pulmonary:     Effort: Pulmonary effort is normal. No respiratory distress.  Neurological:     Mental Status: He is alert.  Psychiatric:        Behavior: Behavior is cooperative.      Assessment / Plan  Danny West is a 46 y.o. male with PMH MDD, GAD, alcohol use d/o (no w/d hx), hypothyroidism (on synthroid) who presented Voluntary to [facility] (09/14/2021) as a walk-in alone for EtOH detox in the setting of missing phone call with children day prior and recommendation of counselor Thyra Breed at [facility] [place] 2 weeks prior.  Last drink 09/13/2021 1 hour prior to [facility] presentation.   AUD Pt has been drinking  1 twisted tea as in the AM + 14 oz beers x4 in the evening on weekdays, then copious amounts on the weekends (pt unclear how much). Has been drinking this amount since at least 46 years old. Denied h/o seizures or DT.  BAL 128, AST/ALT 55/27. Discussed CD-IOP/S-IOP and patient declined. Patient was on naltrexone 50 mg in the past, stated that it made him feel sleepy and dizzy. Reported residual EtOH craving, started gabapentin to relieve it.  Continued CIWA with ativan per protocol with thiamine, MV Continued ativan taper per protocol (last day 9/6) Started gabapentin 300 mg TID   MDD Patient with 6 of 9 sxs, denied SI or difficulty with concentration. Last suicidal ideation June 2023, when pt attempted suicide by OD on zoloft and naltrexone. He did not did not seek medical help or tell therapist. Denied firearms. Continued to deny SI, stating that he is living for his sons. Had 1 psych hospitalization stay, [facility] in Jan 2023 for SI with a plan, d/c'd on zoloft and cytomel. Currently on zoloft 100 mg daily, which he endorsed adherence for past few months. Initially planned to increase zoloft, but held off 9/4 due to EtOH withdrawal sxs and patient's concern of possible activating side effects.  Continued zoloft 100 mg daily Recommend referral to OP psychiatry  Hypothyroidism TSH ~ 44, FT4 ~ 0.7, reported adherence to home synthroid 125 mcg daily. Downtrending over past few months. Instructed patient to ensure to take it on empty stomach, first thing in the AM, then wait 1 hrs  prior to eating for absorption. Was on cytomel 25 mg daily in the past, deferred to outpatient follow-up on restarting it, if needed. Continued synthroid 125 mcg daily Recommend referral and follow-up with PCP  Dispo: 09/17/2021 in AM to Home OP therapist appointment on 09/17/2021 @2pm  PCP f/u at [facility] 09/21/2021 @10 :30am   Treatment Plan Summary: Daily contact with patient to assess and evaluate symptoms  and progress in treatment and Medication management  Total Time spent with patient: 15 mins  Signed: Princess Bruins, DO Psychiatry Resident, PGY-2 09/15/2021, 8:05 PM   [facility] [address] [place], [place] [address] Dept: [phone] Dept Fax: [phone]

## 2021-09-16 MED ORDER — SERTRALINE HCL 50 MG PO TABS
150.0000 mg | ORAL_TABLET | Freq: Every day | ORAL | Status: DC
  Administered 2021-09-17: 150 mg via ORAL
  Filled 2021-09-16: qty 1

## 2021-09-16 MED ORDER — SERTRALINE HCL 50 MG PO TABS
50.0000 mg | ORAL_TABLET | Freq: Once | ORAL | Status: AC
Start: 2021-09-16 — End: 2021-09-16
  Administered 2021-09-16: 50 mg via ORAL
  Filled 2021-09-16: qty 1

## 2021-09-16 NOTE — ED Notes (Signed)
Pt sitting by nurse's station reading a book. A&O x4, calm and cooperative. Denies current SI/HI/AVH. No signs of distress noted. Monitoring for safety.

## 2021-09-16 NOTE — ED Notes (Signed)
Pt is laying in bed quietly reading a book. No c/o pain or distress. Will continue to monitor for safety

## 2021-09-16 NOTE — ED Notes (Signed)
Pt is in the bed sleeping. Respirations are even and unlabored. No acute distress noted. Will continue to monitor for safety. 

## 2021-09-16 NOTE — BH IP Treatment Plan (Signed)
Interdisciplinary Treatment and Diagnostic Plan Update  09/15/2021 Time of Session: 1130 Danny West MRN: [id number]  Diagnosis:  Final diagnoses:  Severe episode of recurrent major depressive disorder, without psychotic features (HCC)  Alcohol use disorder     Current Medications:  Current Facility-Administered Medications  Medication Dose Route Frequency Provider Last Rate Last Admin   acetaminophen (TYLENOL) tablet 650 mg  650 mg Oral Q6H PRN Ardis Hughs, NP       alum & mag hydroxide-simeth (MAALOX/MYLANTA) 200-200-20 MG/5ML suspension 30 mL  30 mL Oral Q4H PRN Ardis Hughs, NP       gabapentin (NEURONTIN) capsule 300 mg  300 mg Oral TID Princess Bruins, DO   300 mg at 09/16/21 8657   hydrOXYzine (ATARAX) tablet 25 mg  25 mg Oral Q6H PRN Ardis Hughs, NP       levothyroxine (SYNTHROID) tablet 125 mcg  125 mcg Oral Q0600 Ardis Hughs, NP   125 mcg at 09/16/21 8469   loperamide (IMODIUM) capsule 2-4 mg  2-4 mg Oral PRN Ardis Hughs, NP       LORazepam (ATIVAN) tablet 1 mg  1 mg Oral Q6H PRN Ardis Hughs, NP       magnesium hydroxide (MILK OF MAGNESIA) suspension 30 mL  30 mL Oral Daily PRN Ardis Hughs, NP       multivitamin with minerals tablet 1 tablet  1 tablet Oral Daily Ardis Hughs, NP   1 tablet at 09/16/21 0926   naltrexone (DEPADE) tablet 50 mg  50 mg Oral Daily Princess Bruins, DO   50 mg at 09/16/21 6295   nicotine (NICODERM CQ - dosed in mg/24 hours) patch 21 mg  21 mg Transdermal Daily Vernard Gambles H, NP   21 mg at 09/16/21 0933   ondansetron (ZOFRAN-ODT) disintegrating tablet 4 mg  4 mg Oral Q6H PRN Ardis Hughs, NP   4 mg at 09/14/21 2116   sertraline (ZOLOFT) tablet 100 mg  100 mg Oral Daily Princess Bruins, DO   100 mg at 09/16/21 2841   thiamine (VITAMIN B1) tablet 100 mg  100 mg Oral Daily Ardis Hughs, NP   100 mg at 09/16/21 3244   traZODone (DESYREL) tablet 50 mg  50 mg Oral QHS PRN Ardis Hughs,  NP   50 mg at 09/15/21 2119   Current Outpatient Medications  Medication Sig Dispense Refill   albuterol (VENTOLIN HFA) 108 (90 Base) MCG/ACT inhaler Inhale 2 puffs into the lungs every 6 (six) hours as needed for wheezing or shortness of breath. 8 g 6   levothyroxine (SYNTHROID) 125 MCG tablet TAKE 1 TABLET BY MOUTH EVERY DAY BEFORE BREAKFAST 30 tablet 0   ZOLOFT 100 MG tablet Take 100 mg by mouth daily.     PTA Medications: Prior to Admission medications   Medication Sig Start Date End Date Taking? Authorizing Provider  albuterol (VENTOLIN HFA) 108 (90 Base) MCG/ACT inhaler Inhale 2 puffs into the lungs every 6 (six) hours as needed for wheezing or shortness of breath. 03/05/21   Marcine Matar, MD  levothyroxine (SYNTHROID) 125 MCG tablet TAKE 1 TABLET BY MOUTH EVERY DAY BEFORE BREAKFAST 08/28/21   Marcine Matar, MD  ZOLOFT 100 MG tablet Take 100 mg by mouth daily. 09/02/21   [provider]    Patient Stressors: Substance abuse    Patient Strengths: Ability for insight  Motivation for treatment/growth  Work skills   Treatment Modalities: Medication Management,  Group therapy, Case management,  1 to 1 session with clinician, Psychoeducation, Recreational therapy.   Physician Treatment Plan for Primary and Secondary Diagnosis:  Final diagnoses:  Severe episode of recurrent major depressive disorder, without psychotic features (HCC)  Alcohol use disorder   Long Term Goal(s): Improvement in symptoms so as ready for discharge  Short Term Goals: Patient will verbalize feelings in meetings with treatment team members. Patient will attend at least of 50% of the groups daily. Pt will complete the PHQ9 on admission, day 3 and discharge. Patient will participate in completing the [place] Suicide Severity Rating Scale Patient will score a low risk of violence for 24 hours prior to discharge Patient will take medications as prescribed daily.  Medication Management:  Evaluate patient's response, side effects, and tolerance of medication regimen.  Therapeutic Interventions: 1 to 1 sessions, Unit Group sessions and Medication administration.  Evaluation of Outcomes: Progressing  LCSW Treatment Plan for Primary Diagnosis:  Final diagnoses:  Severe episode of recurrent major depressive disorder, without psychotic features (HCC)  Alcohol use disorder    Long Term Goal(s): Safe transition to appropriate next level of care at discharge.  Short Term Goals: Identify minimum of 2 triggers associated with mental health/substance abuse issues with treatment team members. and Increase skills for wellness and recovery by attending 50% of scheduled groups.  Therapeutic Interventions: Assess for all discharge needs, 1 to 1 time with Child psychotherapist, Explore available resources and support systems, Assess for adequacy in community support network, Educate family and significant other(s) on suicide prevention, Complete Psychosocial Assessment, Interpersonal group therapy.  Evaluation of Outcomes: Progressing   Progress in Treatment: Attending groups: Yes. Participating in groups: Yes. Taking medication as prescribed: Yes. Toleration medication: Yes. Family/Significant other contact made: No, will contact:    Patient understands diagnosis: Yes. Discussing patient identified problems/goals with staff: Yes. Medical problems stabilized or resolved: Yes. Denies suicidal/homicidal ideation: Yes. Issues/concerns per patient self-inventory: Yes. Other: none  New problem(s) identified: No, Describe:  none  New Short Term/Long Term Goal(s):  Patient Goals: complete detox, have less cravings for alcohol   Discharge Plan or Barriers: resume current outpatient services at [facility], attend AA meetings  Reason for Continuation of Hospitalization: Medication stabilization Withdrawal symptoms  Estimated Length of Stay:  Last 3 [place]  Suicide Severity Risk Score: Flowsheet Row ED from 09/13/2021 in [facility] ED from 08/14/2021 in [facility] ED from 05/26/2021 in [facility]  C-SSRS RISK CATEGORY No Risk No Risk No Risk       Last PHQ 2/9 Scores:    09/13/2021    1:15 PM 05/18/2021   10:41 AM  Depression screen PHQ 2/9  Decreased Interest 1 0  Down, Depressed, Hopeless 2 0  PHQ - 2 Score 3 0  Altered sleeping 2 0  Tired, decreased energy 2 0  Change in appetite 3 1  Feeling bad or failure about yourself  2 0  Trouble concentrating 0 1  Moving slowly or fidgety/restless 0 1  Suicidal thoughts 0 0  PHQ-9 Score 12 3  Difficult doing work/chores Somewhat difficult Somewhat difficult    Scribe for Treatment Team: Wyn Quaker 09/16/2021 11:24 AM

## 2021-09-16 NOTE — [facility] Group Notes (Signed)
LCSW Group Therapy Note   09/16/2021 1:15pm   Type of Therapy and Topic:  Group Therapy:  Overcoming Obstacles   Participation Level:  Active   Description of Group:    In this group patients will be encouraged to explore what they see as obstacles to their own wellness and recovery. They will be guided to discuss their thoughts, feelings, and behaviors related to these obstacles. The group will process together ways to cope with barriers, with attention given to specific choices patients can make. Each patient will be challenged to identify changes they are motivated to make in order to overcome their obstacles. This group will be process-oriented, with patients participating in exploration of their own experiences as well as giving and receiving support and challenge from other group members.   Therapeutic Goals: Patient will identify personal and current obstacles as they relate to admission. Patient will identify barriers that currently interfere with their wellness or overcoming obstacles.  Patient will identify feelings, thought process and behaviors related to these barriers. Patient will identify two changes they are willing to make to overcome these obstacles:      Summary of Patient Progress: pt active in group discussion as has been the case throughout this week. Pt identified finances as one obstacle in his life.  Pt also shared about addiction being an obstacle to his being able to have contact with his children.        Therapeutic Modalities:   Cognitive Behavioral Therapy Solution Focused Therapy Motivational Interviewing Relapse Prevention Therapy  Lorri Frederick, LCSW 09/16/2021

## 2021-09-16 NOTE — ED Provider Notes (Signed)
Facility Based Crisis Behavioral Health Progress Note  Date and Time: 09/16/2021 12:10 PM Name: Danny West DOB: April 30, 1975  MRN:  [id number]  Diagnosis:  Final diagnoses:  Severe episode of recurrent major depressive disorder, without psychotic features (HCC)  Alcohol use disorder   Reason for presentation: Alcohol Problem and Addiction Problem  Brief HPI  Danny West is a 46 y.o. male, with PMH MDD, GAD, alcohol use d/o (no w/d hx), hypothyroidism (on synthroid) who presented Voluntary to [facility] (09/13/2021) as a walk-in alone for EtOH detox in the setting of missing phone call with children day prior and recommendation of counselor Thyra Breed at [facility] [place] 2 weeks prior.  Last drink 09/13/2021 1 hour prior to [facility] presentation.  Interval Hx  Last 24h: No acute behavioral concerns, however, patient did request trazodone due to difficulty sleeping.   Subjective:   Patient was initially seen laying in his bed reading, no acute distress.  Patient denied EtOH craving today, but was able to describe techniques to cope with those feelings including walking, journal, reading his list of goals he made. Patient reported improved sleep last night after receiving trazodone. Reported feeling "spacey" and "groggy" today which he believes is from the trazodone as he endorsed similar feelings with prior trazodone use. Appetite improved yesterday as he was able to eat three moderate sized meals, however, he hasn't eaten yet today due to sleeping through breakfast and denies feeling hungry at present. Denied depressed mood, SI/HI. Patient does endorse visual and auditory illusions such as seeing floaters which he described as "cockroaches" and hearing "music playing down the hall." Reported no concerns regarding illusions, patient was able to recognize that these were not real and self-resolved after a few minutes. Patient also reported numbness and tingling in his hands  and feet but states this is a chronic issue which has neither worsened or improved. Contracted to safety. Is hopeful about sobriety but reported decreased motivation today due to feeling "overwhelmed" by what he needs to accomplish at home and work after discharge. Requested being d/c'd on Thursday AM (9/7) to ensure he's not alone until his outpatient therapy appointment at 2pm.  Patient has no other questions or concerns, amenable to plan below.   ROS   Mood: Euthymic ("groggy") Sleep: Fair (Reported full night of sleeping, but felt oversedated in the AM due to PRN trazodone) Appetite: Improving  SI: No HI: No AVH: None   Review of Systems  Constitutional:  Negative for diaphoresis.  Respiratory:  Negative for shortness of breath.   Cardiovascular:  Negative for chest pain.  Gastrointestinal:  Positive for diarrhea. Negative for abdominal pain, nausea and vomiting.  Neurological:  Positive for tingling.    Psych ROS   Depression: depressed mood, insomnia, fatigue, feelings of worthlessness/guilt, difficulty concentrating, suicidal attempt (June 2023 - OD on zoloft and naltrexone, did not seek medical attention or told therapist), loss of energy/fatigue, decreased appetite with 15lb wt loss over 3 months) Duration of Depression: Greater than two weeks  Hypo-/Mania: Denied excessive energy with decreased need for sleep, persistent irritability, excessive spending, sexual indiscretion, hyperverbal/pressured speech, elated mood. Anxiety: Denied excessive worry or stress that is difficult to manage. Psychosis: Denied AVH Trauma: Denied h/o trauma, nightmares, flashbacks  Past History   Psychiatric History:  Dx: MDD, GAD, alcohol use d/o (no w/d hx), stimulant use d/o (vyvanse, adderal), self-reported ADHD Prior Rx: naltrexone 50 mg daily. Prozac in 1998, and Zoloft from 2019-January of 2022. Cytomel  25 mg daily Current OP psychiatrist: Denied Prior OP psychiatrist: Unsure Current OP  therapist: Thyra Breed at [facility] [place] PCP: Denied Suicide Attempt: Yes, June 2023 - OD on handful of zoloft and naltrexone, wrote on his white board at his apartment that if he wakes up the next day, he needs to live for his children, did not seek medical attention or told therapist.  Inpatient psych: 01/24/2021 [facility] Violence/Aggression: Denied  Past Medical History:  Past Medical History:  Diagnosis Date   Alcohol use disorder, severe, dependence (HCC) 09/13/2021   Asthma    H/O suicide attempt 09/14/2021   June 2023 - "took handful of zoloft and naltrexone while home alone", wrote on whiteboard at home, "if you're alive tomorrow, you will live for your kids". Has not mentioned this to anyone Academic librarian included), no psych hospitalization    Past Surgical History:  Procedure Laterality Date   NO PAST SURGERIES     Family Psychiatric History:  Suicide: Denied  Bipolar d/o: Father SCZ/ScZA: Denied Inpatient psych: Denied Substance use: EtOH abuse in mother and father  Family History: No family history on file.  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 Twisted tea x1 in AM & 14oz beer x4 after work. On weekends pt unclear how much but "a lot more" Tobacco: Denied Cannabis: Denied IVDU: Denied Opiates: Denied BZOs: Denied Others: Stimulant use (Adderall and Vyvanse), in remission Seizure: Denied Detox: Yes Residential: Denied Seizure/DT: Denied Social History   Substance and Sexual Activity  Alcohol Use Yes   Comment: drinks (4) 16 oz beers daily     Social History   Substance and Sexual Activity  Drug Use Never    Social History   Socioeconomic History   Marital status: Divorced    Spouse name: Not on file   Number of children: 2   Years of education: 12   Highest education level: Not on file  Occupational History   Not  on file  Tobacco Use   Smoking status: Never   Smokeless tobacco: Current    Types: Snuff  Vaping Use   Vaping Use: Never used  Substance and Sexual Activity   Alcohol use: Yes    Comment: drinks (4) 16 oz beers daily   Drug use: Never   Sexual activity: Not Currently  Other Topics Concern   Not on file  Social History Narrative   Pt lives alone and works at [employer]. Divorced 11/2019; twin boys are 48 years old   Social Determinants of Health   Financial Resource Strain: Not on file  Food Insecurity: Not on file  Transportation Needs: Not on file  Physical Activity: Not on file  Stress: Not on file  Social Connections: Not on file    SDOH:  SDOH Screenings   Alcohol Screen: Medium Risk (01/20/2021)  Depression (PHQ2-9): High Risk (09/13/2021)  Tobacco Use: High Risk (09/14/2021)   Additional Social History:    Pain Medications: see MAR Prescriptions: see MAR Over the Counter: see MAR History of alcohol / drug use?: Yes Longest period of sobriety (when/how long): No periods sobriety Negative Consequences of Use: Legal, Personal relationships, Work / School Withdrawal Symptoms: Sweats, Tremors, Weakness Name of Substance 1: Alcohol 1 - Age of First Use: 46 years old 1 - Amount (size/oz): intermittent binges that may last for weeks at a time 1 - Frequency:  intermittent binges that may last for weeks at a time 1 - Duration: on-going 1 - Last Use / Amount: today; 2 twisted teas 1 - Method of Aquiring: self purchase 1- Route of Use: oral Name of Substance 2: THC 2 - Age of First Use: unknown 2 - Amount (size/oz): unknown 2 - Frequency: unknown 2 - Duration: unknown 2 - Last Use / Amount: 2 years ago 2 - Method of Aquiring: unknown 2 - Route of Substance Use: smoking Name of Substance 3: Vyvanse/Adderal 3 - Age of First Use: 2010 3 - Amount (size/oz): unknown 3 - Frequency: unknown 3 - Duration: unknown 3 - Last Use / Amount: 2010 3 - Method of Aquiring: varies 3 -  Route of Substance Use: oral              Current Medications   Current Facility-Administered Medications  Medication Dose Route Frequency Provider Last Rate Last Admin   acetaminophen (TYLENOL) tablet 650 mg  650 mg Oral Q6H PRN Ardis Hughs, NP       alum & mag hydroxide-simeth (MAALOX/MYLANTA) 200-200-20 MG/5ML suspension 30 mL  30 mL Oral Q4H PRN Ardis Hughs, NP       gabapentin (NEURONTIN) capsule 300 mg  300 mg Oral TID Princess Bruins, DO   300 mg at 09/16/21 1610   hydrOXYzine (ATARAX) tablet 25 mg  25 mg Oral Q6H PRN Ardis Hughs, NP       levothyroxine (SYNTHROID) tablet 125 mcg  125 mcg Oral Q0600 Ardis Hughs, NP   125 mcg at 09/16/21 9604   loperamide (IMODIUM) capsule 2-4 mg  2-4 mg Oral PRN Ardis Hughs, NP       LORazepam (ATIVAN) tablet 1 mg  1 mg Oral Q6H PRN Ardis Hughs, NP       magnesium hydroxide (MILK OF MAGNESIA) suspension 30 mL  30 mL Oral Daily PRN Ardis Hughs, NP       multivitamin with minerals tablet 1 tablet  1 tablet Oral Daily Ardis Hughs, NP   1 tablet at 09/16/21 0926   naltrexone (DEPADE) tablet 50 mg  50 mg Oral Daily Princess Bruins, DO   50 mg at 09/16/21 5409   nicotine (NICODERM CQ - dosed in mg/24 hours) patch 21 mg  21 mg Transdermal Daily Vernard Gambles H, NP   21 mg at 09/16/21 0933   ondansetron (ZOFRAN-ODT) disintegrating tablet 4 mg  4 mg Oral Q6H PRN Ardis Hughs, NP   4 mg at 09/14/21 2116   [START ON 09/17/2021] sertraline (ZOLOFT) tablet 150 mg  150 mg Oral Daily Princess Bruins, DO       sertraline (ZOLOFT) tablet 50 mg  50 mg Oral Once Princess Bruins, DO       thiamine (VITAMIN B1) tablet 100 mg  100 mg Oral Daily Vernard Gambles H, NP   100 mg at 09/16/21 0926   traZODone (DESYREL) tablet 50 mg  50 mg Oral QHS PRN Ardis Hughs, NP   50 mg at 09/15/21 2119   Current Outpatient Medications  Medication Sig Dispense Refill   albuterol (VENTOLIN HFA) 108 (90 Base) MCG/ACT inhaler  Inhale 2 puffs into the lungs every 6 (six) hours as needed for wheezing or shortness of breath. 8 g 6   levothyroxine (SYNTHROID) 125 MCG tablet TAKE 1 TABLET BY MOUTH EVERY DAY BEFORE BREAKFAST 30 tablet 0   ZOLOFT 100 MG tablet Take 100 mg by mouth daily.  Labs / Images  Lab Results:  Admission on 09/13/2021  Component Date Value Ref Range Status   SARS Coronavirus 2 by RT PCR 09/13/2021 NEGATIVE  NEGATIVE Final   Comment: (NOTE) SARS-CoV-2 target nucleic acids are NOT DETECTED.  The SARS-CoV-2 RNA is generally detectable in upper respiratory specimens during the acute phase of infection. The lowest concentration of SARS-CoV-2 viral copies this assay can detect is 138 copies/mL. A negative result does not preclude SARS-Cov-2 infection and should not be used as the sole basis for treatment or other patient management decisions. A negative result may occur with  improper specimen collection/handling, submission of specimen other than nasopharyngeal swab, presence of viral mutation(s) within the areas targeted by this assay, and inadequate number of viral copies(<138 copies/mL). A negative result must be combined with clinical observations, patient history, and epidemiological information. The expected result is Negative.  Fact Sheet for Patients:  BloggerCourse.com  Fact Sheet for Healthcare Providers:  SeriousBroker.it  This test is no                          t yet approved or cleared by the [place] FDA and  has been authorized for detection and/or diagnosis of SARS-CoV-2 by FDA under an Emergency Use Authorization (EUA). This EUA will remain  in effect (meaning this test can be used) for the duration of the COVID-19 declaration under Section 564(b)(1) of the Act, 21 U.S.C.section 360bbb-3(b)(1), unless the authorization is terminated  or revoked sooner.       Influenza A by PCR 09/13/2021 NEGATIVE  NEGATIVE  Final   Influenza B by PCR 09/13/2021 NEGATIVE  NEGATIVE Final   Comment: (NOTE) The Xpert Xpress SARS-CoV-2/FLU/RSV plus assay is intended as an aid in the diagnosis of influenza from Nasopharyngeal swab specimens and should not be used as a sole basis for treatment. Nasal washings and aspirates are unacceptable for Xpert Xpress SARS-CoV-2/FLU/RSV testing.  Fact Sheet for Patients: BloggerCourse.com  Fact Sheet for Healthcare Providers: SeriousBroker.it  This test is not yet approved or cleared by the [place] FDA and has been authorized for detection and/or diagnosis of SARS-CoV-2 by FDA under an Emergency Use Authorization (EUA). This EUA will remain in effect (meaning this test can be used) for the duration of the COVID-19 declaration under Section 564(b)(1) of the Act, 21 U.S.C. section 360bbb-3(b)(1), unless the authorization is terminated or revoked.  Performed at [facility], [address] [place], [place] [address]    Neisseria Gonorrhea 09/13/2021 Negative   Final   Chlamydia 09/13/2021 Negative   Final   Comment 09/13/2021 Normal Reference Ranger Chlamydia - Negative   Final   Comment 09/13/2021 Normal Reference Range Neisseria Gonorrhea - Negative   Final   RPR Ser Ql 09/13/2021 NON REACTIVE  NON REACTIVE Final   Performed at [facility], [address] [place], [place] [address]   TSH 09/13/2021 44.466 (H)  0.350 - 4.500 uIU/mL Final   Comment: Performed by a 3rd Generation assay with a functional sensitivity of <=0.01 uIU/mL. Performed at [facility], [address] [place], [place] [address]    Cholesterol 09/13/2021 226 (H)  0 - 200 mg/dL Final   Triglycerides 91/47/8295 718 (H)  <150 mg/dL Final   HDL 62/13/0865 53  >40 mg/dL Final   Total CHOL/HDL Ratio 09/13/2021 4.3  RATIO Final   VLDL 09/13/2021 UNABLE TO CALCULATE IF TRIGLYCERIDE OVER 400 mg/dL  0 -  40 mg/dL Final   LDL  Cholesterol 09/13/2021 UNABLE TO CALCULATE IF TRIGLYCERIDE OVER 400 mg/dL  0 - 99 mg/dL Final   Comment:        Total Cholesterol/HDL:CHD Risk Coronary Heart Disease Risk Table                     Men   Women  1/2 Average Risk   3.4   3.3  Average Risk       5.0   4.4  2 X Average Risk   9.6   7.1  3 X Average Risk  23.4   11.0        Use the calculated Patient Ratio above and the CHD Risk Table to determine the patient's CHD Risk.        ATP III CLASSIFICATION (LDL):  <100     mg/dL   Optimal  045-409  mg/dL   Near or Above                    Optimal  130-159  mg/dL   Borderline  811-914  mg/dL   High  >782     mg/dL   Very High Performed at [facility], [address], [place], [place] [address]    Alcohol, Ethyl (B) 09/13/2021 128 (H)  <10 mg/dL Final   Comment: (NOTE) Lowest detectable limit for serum alcohol is 10 mg/dL.  For medical purposes only. Performed at [facility], [address] [place], [place] [address]    Magnesium 09/13/2021 2.2  1.7 - 2.4 mg/dL Final   Performed at [facility], [address], [place], [place] [address]   Hgb A1c MFr Bld 09/13/2021 5.2  4.8 - 5.6 % Final   Comment: (NOTE) Pre diabetes:          5.7%-6.4%  Diabetes:              >6.4%  Glycemic control for   <7.0% adults with diabetes    Mean Plasma Glucose 09/13/2021 102.54  mg/dL Final   Performed at [facility], [address], [place], [place] [address]   Sodium 09/13/2021 138  135 - 145 mmol/L Final   Potassium 09/13/2021 3.9  3.5 - 5.1 mmol/L Final   Chloride 09/13/2021 103  98 - 111 mmol/L Final   CO2 09/13/2021 24  22 - 32 mmol/L Final   Glucose, Bld 09/13/2021 74  70 - 99 mg/dL Final   Glucose reference range applies only to samples taken after fasting for at least 8 hours.   BUN 09/13/2021 8  6 - 20 mg/dL Final   Creatinine, Ser 09/13/2021 0.64  0.61 - 1.24 mg/dL Final   Calcium 84/13/2440 9.0  8.9 - 10.3 mg/dL Final   Total Protein  09/13/2021 7.1  6.5 - 8.1 g/dL Final   Albumin 11/07/2534 4.0  3.5 - 5.0 g/dL Final   AST 64/40/3474 55 (H)  15 - 41 U/L Final   ALT 09/13/2021 27  0 - 44 U/L Final   Alkaline Phosphatase 09/13/2021 103  38 - 126 U/L Final   Total Bilirubin 09/13/2021 1.2  0.3 - 1.2 mg/dL Final   GFR, Estimated 09/13/2021 >60  >60 mL/min Final   Comment: (NOTE) Calculated using the CKD-EPI Creatinine Equation (2021)    Anion gap 09/13/2021 11  5 - 15 Final   Performed at [facility], [address] [place], [place] [address]  WBC 09/13/2021 5.1  4.0 - 10.5 K/uL Final   RBC 09/13/2021 4.63  4.22 - 5.81 MIL/uL Final   Hemoglobin 09/13/2021 15.2  13.0 - 17.0 g/dL Final   HCT 16/10/9602 42.4  39.0 - 52.0 % Final   MCV 09/13/2021 91.6  80.0 - 100.0 fL Final   MCH 09/13/2021 32.8  26.0 - 34.0 pg Final   MCHC 09/13/2021 35.8  30.0 - 36.0 g/dL Final   RDW 54/09/8117 12.4  11.5 - 15.5 % Final   Platelets 09/13/2021 167  150 - 400 K/uL Final   nRBC 09/13/2021 0.0  0.0 - 0.2 % Final   Neutrophils Relative % 09/13/2021 57  % Final   Neutro Abs 09/13/2021 2.9  1.7 - 7.7 K/uL Final   Lymphocytes Relative 09/13/2021 31  % Final   Lymphs Abs 09/13/2021 1.6  0.7 - 4.0 K/uL Final   Monocytes Relative 09/13/2021 8  % Final   Monocytes Absolute 09/13/2021 0.4  0.1 - 1.0 K/uL Final   Eosinophils Relative 09/13/2021 2  % Final   Eosinophils Absolute 09/13/2021 0.1  0.0 - 0.5 K/uL Final   Basophils Relative 09/13/2021 2  % Final   Basophils Absolute 09/13/2021 0.1  0.0 - 0.1 K/uL Final   Immature Granulocytes 09/13/2021 0  % Final   Abs Immature Granulocytes 09/13/2021 0.01  0.00 - 0.07 K/uL Final   Performed at [facility], [address] [place], [place] [address]   POC Amphetamine UR 09/13/2021 None Detected  NONE DETECTED (Cut Off Level 1000 ng/mL) Final   POC Secobarbital (BAR) 09/13/2021 None Detected  NONE DETECTED (Cut Off Level 300 ng/mL) Final   POC Buprenorphine (BUP) 09/13/2021 None  Detected  NONE DETECTED (Cut Off Level 10 ng/mL) Final   POC Oxazepam (BZO) 09/13/2021 None Detected  NONE DETECTED (Cut Off Level 300 ng/mL) Final   POC Cocaine UR 09/13/2021 None Detected  NONE DETECTED (Cut Off Level 300 ng/mL) Final   POC Methamphetamine UR 09/13/2021 None Detected  NONE DETECTED (Cut Off Level 1000 ng/mL) Final   POC Morphine 09/13/2021 None Detected  NONE DETECTED (Cut Off Level 300 ng/mL) Final   POC Methadone UR 09/13/2021 None Detected  NONE DETECTED (Cut Off Level 300 ng/mL) Final   POC Oxycodone UR 09/13/2021 None Detected  NONE DETECTED (Cut Off Level 100 ng/mL) Final   POC Marijuana UR 09/13/2021 None Detected  NONE DETECTED (Cut Off Level 50 ng/mL) Final   SARSCOV2ONAVIRUS 2 AG 09/13/2021 NEGATIVE  NEGATIVE Final   Comment: (NOTE) SARS-CoV-2 antigen NOT DETECTED.   Negative results are presumptive.  Negative results do not preclude SARS-CoV-2 infection and should not be used as the sole basis for treatment or other patient management decisions, including infection  control decisions, particularly in the presence of clinical signs and  symptoms consistent with COVID-19, or in those who have been in contact with the virus.  Negative results must be combined with clinical observations, patient history, and epidemiological information. The expected result is Negative.  Fact Sheet for Patients: https://www.jennings-kim.com/  Fact Sheet for Healthcare Providers: https://alexander-rogers.biz/  This test is not yet approved or cleared by the [place] FDA and  has been authorized for detection and/or diagnosis of SARS-CoV-2 by FDA under an Emergency Use Authorization (EUA).  This EUA will remain in effect (meaning this test can be used) for the duration of  the COV  ID-19 declaration under Section 564(b)(1) of the Act, 21 U.S.C. section 360bbb-3(b)(1), unless the authorization is terminated or revoked  sooner.     Direct LDL 09/13/2021 71  0 - 99 mg/dL Final   Performed at [facility], [address] [place], [place] [address]  Appointment on 06/17/2021  Component Date Value Ref Range Status   TSH 06/17/2021 53.300 (H)  0.450 - 4.500 uIU/mL Final  Admission on 05/26/2021, Discharged on 05/26/2021  Component Date Value Ref Range Status   Lipase 05/26/2021 24  11 - 51 U/L Final   Performed at [facility], [address], [place], [place] [address]   Sodium 05/26/2021 136  135 - 145 mmol/L Final   Potassium 05/26/2021 3.7  3.5 - 5.1 mmol/L Final   Chloride 05/26/2021 100  98 - 111 mmol/L Final   CO2 05/26/2021 21 (L)  22 - 32 mmol/L Final   Glucose, Bld 05/26/2021 128 (H)  70 - 99 mg/dL Final   Glucose reference range applies only to samples taken after fasting for at least 8 hours.   BUN 05/26/2021 9  6 - 20 mg/dL Final   Creatinine, Ser 05/26/2021 0.73  0.61 - 1.24 mg/dL Final   Calcium 51/88/416605/16/2023 8.9  8.9 - 10.3 mg/dL Final   Total Protein 06/30/160105/16/2023 7.5  6.5 - 8.1 g/dL Final   Albumin 09/32/355705/16/2023 4.1  3.5 - 5.0 g/dL Final   AST 32/20/254205/16/2023 22  15 - 41 U/L Final   ALT 05/26/2021 13  0 - 44 U/L Final   Alkaline Phosphatase 05/26/2021 116  38 - 126 U/L Final   Total Bilirubin 05/26/2021 0.7  0.3 - 1.2 mg/dL Final   GFR, Estimated 05/26/2021 >60  >60 mL/min Final   Comment: (NOTE) Calculated using the CKD-EPI Creatinine Equation (2021)    Anion gap 05/26/2021 15  5 - 15 Final   Performed at [facility], [address], [place], [place] [address]   WBC 05/26/2021 5.8  4.0 - 10.5 K/uL Final   RBC 05/26/2021 4.45  4.22 - 5.81 MIL/uL Final   Hemoglobin 05/26/2021 14.4  13.0 - 17.0 g/dL Final   HCT 76/28/315105/16/2023 43.5  39.0 - 52.0 % Final   MCV 05/26/2021 97.8  80.0 - 100.0 fL Final   MCH 05/26/2021 32.4  26.0 - 34.0 pg Final   MCHC 05/26/2021 33.1  30.0 - 36.0 g/dL Final   RDW 76/16/073705/16/2023 13.4  11.5 - 15.5 % Final   Platelets 05/26/2021  219  150 - 400 K/uL Final   nRBC 05/26/2021 0.0  0.0 - 0.2 % Final   Performed at [facility], [address], [place], [place] [address]  Admission on 05/05/2021, Discharged on 05/05/2021  Component Date Value Ref Range Status   Sodium 05/05/2021 136  135 - 145 mmol/L Final   Potassium 05/05/2021 3.7  3.5 - 5.1 mmol/L Final   Chloride 05/05/2021 100  98 - 111 mmol/L Final   CO2 05/05/2021 22  22 - 32 mmol/L Final   Glucose, Bld 05/05/2021 137 (H)  70 - 99 mg/dL Final   Glucose reference range applies only to samples taken after fasting for at least 8 hours.   BUN 05/05/2021 10  6 - 20 mg/dL Final   Creatinine, Ser 05/05/2021 0.76  0.61 - 1.24 mg/dL Final   Calcium 94/85/462704/25/2023 9.4  8.9 - 10.3 mg/dL Final   GFR, Estimated 05/05/2021 >60  >60 mL/min Final   Comment: (NOTE) Calculated using  the CKD-EPI Creatinine Equation (2021)    Anion gap 05/05/2021 14  5 - 15 Final   Performed at [facility], [address], [place], [place] [address]   WBC 05/05/2021 9.2  4.0 - 10.5 K/uL Final   RBC 05/05/2021 4.65  4.22 - 5.81 MIL/uL Final   Hemoglobin 05/05/2021 15.2  13.0 - 17.0 g/dL Final   HCT 60/45/4098 43.5  39.0 - 52.0 % Final   MCV 05/05/2021 93.5  80.0 - 100.0 fL Final   MCH 05/05/2021 32.7  26.0 - 34.0 pg Final   MCHC 05/05/2021 34.9  30.0 - 36.0 g/dL Final   RDW 11/91/4782 13.5  11.5 - 15.5 % Final   Platelets 05/05/2021 188  150 - 400 K/uL Final   nRBC 05/05/2021 0.0  0.0 - 0.2 % Final   Performed at [facility], [address], [place], [place] [address]   Troponin I (High Sensitivity) 05/05/2021 2  <18 ng/L Final   Comment: (NOTE) Elevated high sensitivity troponin I (hsTnI) values and significant  changes across serial measurements may suggest ACS but many other  chronic and acute conditions are known to elevate hsTnI results.  Refer to the "Links" section for chest pain algorithms and additional  guidance. Performed  at [facility], [address] [place], [place] [address]   Lab on 05/04/2021  Component Date Value Ref Range Status   Total Protein 05/04/2021 7.0  6.0 - 8.5 g/dL Final   Albumin 78/46/9629 4.2  4.0 - 5.0 g/dL Final   Bilirubin Total 05/04/2021 0.4  0.0 - 1.2 mg/dL Final   Bilirubin, Direct 05/04/2021 0.32  0.00 - 0.40 mg/dL Final   Alkaline Phosphatase 05/04/2021 164 (H)  44 - 121 IU/L Final   AST 05/04/2021 21  0 - 40 IU/L Final   ALT 05/04/2021 10  0 - 44 IU/L Final   TSH 05/04/2021 97.400 (H)  0.450 - 4.500 uIU/mL Final   T3, Free 05/04/2021 2.0  2.0 - 4.4 pg/mL Final   Free T4 05/04/2021 0.74 (L)  0.82 - 1.77 ng/dL Final   Blood Alcohol level:  Lab Results  Component Value Date   ETH 128 (H) 09/13/2021   ETH 11 (H) 01/19/2021   Metabolic Disorder Labs: Lab Results  Component Value Date   HGBA1C 5.2 09/13/2021   MPG 102.54 09/13/2021   MPG 76.71 01/19/2021   No results found for: "PROLACTIN" Lab Results  Component Value Date   CHOL 226 (H) 09/13/2021   TRIG 718 (H) 09/13/2021   HDL 53 09/13/2021   CHOLHDL 4.3 09/13/2021   VLDL UNABLE TO CALCULATE IF TRIGLYCERIDE OVER 400 mg/dL 52/84/1324   LDLCALC UNABLE TO CALCULATE IF TRIGLYCERIDE OVER 400 mg/dL 40/10/2723   LDLCALC 366 (H) 01/22/2021   Therapeutic Lab Levels: No results found for: "LITHIUM" No results found for: "VALPROATE" No results found for: "CBMZ" Physical Findings   AIMS    Flowsheet Row [facility]  AIMS Total Score 0      AUDIT    Flowsheet Row Ad[facility] ([facility]  Alcohol Use Disorder Identification Test Final Score (AUDIT) 13      PHQ2-9    Flowsheet Row ED from 09/13/2021 in [facility] Office Visit from 05/18/2021 in [facility]  PHQ-2 Total Score 3 0  PHQ-9 Total  Score 12 3  Flowsheet Row ED from 09/13/2021 in [facility] ED from 08/14/2021 in [facility] ED from 05/26/2021 in [facility]  C-SSRS RISK CATEGORY No Risk No Risk No Risk        Musculoskeletal  Strength & Muscle Tone: within normal limits Gait & Station: normal Patient leans: N/A   Psychiatric Specialty Exam  Presentation  General Appearance: Casual; Fairly Groomed; Appropriate for Environment  Eye Contact:Good  Speech:Clear and Coherent; Normal Rate  Speech Volume:Normal  Handedness:Right   Mood and Affect  Mood:Euthymic ("groggy")  Affect:Congruent; Appropriate; Full Range   Thought Process  Thought Processes:Goal Directed; Coherent; Linear  Descriptions of Associations:Circumstantial   Thought Content Suicidal Thoughts:Suicidal Thoughts: No  Homicidal Thoughts:Homicidal Thoughts: No  Hallucinations:Hallucinations: None  Ideas of Reference:None  Thought Content:Logical; WDL   Sensorium  Memory:Immediate Good  Judgment:Fair  Insight:Fair   Executive Functions  Orientation:Full (Time, Place and Person)  Language:Good  Concentration:Good  Attention:Good  Recall:Good  Fund of Knowledge:Good   Psychomotor Activity  Psychomotor Activity:Psychomotor Activity: Normal   Assets  Assets:Communication Skills; Desire for Improvement; Housing; Resilience; Social Support   Sleep  Sleep:Sleep: Fair (Reported full night of sleeping, but felt oversedated in the AM due to PRN trazodone)   Physical Exam  BP 98/63   Pulse 70   Temp 97.8 F (36.6 C) (Oral)   Resp 18   SpO2 98%  Physical Exam Vitals and nursing note reviewed.  Constitutional:      General: He is not in acute distress.    Appearance: He is not ill-appearing, toxic-appearing or diaphoretic.  HENT:     Head: Normocephalic.  Pulmonary:     Effort: Pulmonary effort is normal. No  respiratory distress.  Neurological:     Mental Status: He is lethargic.  Psychiatric:        Behavior: Behavior is cooperative.      Assessment / Plan  Danny West is a 46 y.o. male with PMH MDD, GAD, alcohol use d/o (no w/d hx), hypothyroidism (on synthroid) who presented Voluntary to [facility] (09/14/2021) as a walk-in alone for EtOH detox in the setting of missing phone call with children day prior and recommendation of counselor Thyra Breed at family services of [place] 2 weeks prior.  Last drink 09/13/2021 1 hour prior to [facility] presentation.   AUD Pt has been drinking 1 twisted tea as in the AM + 14 oz beers x4 in the evening on weekdays, then copious amounts on the weekends (pt unclear how much). Has been drinking this amount since at least 46 years old. Denied h/o seizures or DT.  BAL 128, AST/ALT 55/27. Discussed CD-IOP/S-IOP and patient declined. Patient was on naltrexone 50 mg in the past, stated that it made him feel sleepy and dizzy. Reported residual EtOH craving, started gabapentin to relieve it, with good affect.  Continued CIWA with ativan per protocol with thiamine, MV Completed ativan taper per protocol (last day 9/6)  Continued gabapentin 300 mg TID  Continued naltrexone 50 mg QD  MDD Patient with 6 of 9 sxs, denied SI or difficulty with concentration. Last suicidal ideation June 2023, when pt attempted suicide by OD on zoloft and naltrexone. He did not did not seek medical help or tell therapist. Denied firearms. Continued to deny SI, stating that he is living for his sons. Had 1 psych hospitalization stay, [facility] in Jan 2023 for SI with a plan, d/c'd on zoloft and cytomel. Currently on zoloft  100 mg daily, which he endorsed adherence for past few months. Initially planned to increase zoloft, but held off 9/4 due to EtOH withdrawal sxs and patient's concern of possible activating side effects.  Increased zoloft from 100 mg to 150 mg daily (50 mg given 9/6 for  total zoloft dose of 150 mg per patient request) Recommend referral to OP psychiatry  Hypothyroidism TSH ~ 44, FT4 ~ 0.7, reported adherence to home synthroid 125 mcg daily. Downtrending over past few months. Instructed patient to ensure to take it on empty stomach, first thing in the AM, then wait 1 hrs prior to eating for absorption. Was on cytomel 25 mg daily in the past, deferred to outpatient follow-up on restarting it, if needed. Continued synthroid 125 mcg daily Recommend referral and follow-up with PCP  Dispo: 09/17/2021 in AM to Home OP therapist appointment on 09/17/2021 @2pm  PCP f/u at [facility] 09/21/2021 @10 :30am   Treatment Plan Summary: Daily contact with patient to assess and evaluate symptoms and progress in treatment and Medication management  Total Time spent with patient: 15 mins  Signed: 11/21/2021, Student-PA 09/16/2021, 12:10 PM   [facility] [address] [place], [address] [place] Dept: [phone] Dept Fax: [phone]   [phone], Student-PA 09/16/21 1214

## 2021-09-16 NOTE — ED Notes (Signed)
Attending AA 

## 2021-09-17 MED ORDER — NALTREXONE HCL 50 MG PO TABS: 50.0000 mg | ORAL_TABLET | Freq: Every day | ORAL | 0 refills | Status: AC

## 2021-09-17 MED ORDER — SERTRALINE HCL 100 MG PO TABS: 150.0000 mg | ORAL_TABLET | Freq: Every day | ORAL | 0 refills | Status: DC

## 2021-09-17 MED ORDER — GABAPENTIN 300 MG PO CAPS
300.0000 mg | ORAL_CAPSULE | Freq: Three times a day (TID) | ORAL | 0 refills | Status: DC
Start: 2021-09-17 — End: 2022-01-22

## 2021-09-17 MED ORDER — TRAZODONE HCL 50 MG PO TABS: 25.0000 mg | ORAL_TABLET | Freq: Every evening | ORAL | 0 refills | Status: DC | PRN

## 2021-09-17 MED ORDER — VITAMIN B-1 100 MG PO TABS: 100.0000 mg | ORAL_TABLET | Freq: Every day | ORAL | Status: DC

## 2021-09-17 NOTE — ED Notes (Signed)
Pt asleep in bed. Respirations even and unlabored. Monitoring for safety. 
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
## 2021-09-17 NOTE — ED Notes (Signed)
Pt given a copy of his avs along with follow up instructions and instructions on call in prescriptions.  Pt verbalized understanding. He was escorted to lobby by staff after getting back all his belongings from locker 28.   Pt stated he was going to go to his therapist today at 3 and that he was "looking forward to this new life".   No further distress noted.

## 2021-09-17 NOTE — ED Notes (Signed)
Pt is awake and alert.  Denies SI, HI  or AVH  denies any type of withdrawal symptoms.  States that he feels he is ready for discharge.  Pt reports that he has his car in the parking lot for transportation home.

## 2021-09-21 ENCOUNTER — Encounter: Payer: Self-pay | Admitting: Internal Medicine

## 2021-09-21 ENCOUNTER — Ambulatory Visit: Payer: BC Managed Care – PPO | Attending: Internal Medicine | Admitting: Internal Medicine

## 2021-09-21 VITALS — BP 114/73 | HR 70 | Temp 98.1°F | Ht 67.0 in | Wt 178.2 lb

## 2021-09-21 DIAGNOSIS — F330 Major depressive disorder, recurrent, mild: Secondary | ICD-10-CM | POA: Diagnosis not present

## 2021-09-21 DIAGNOSIS — E039 Hypothyroidism, unspecified: Secondary | ICD-10-CM

## 2021-09-21 DIAGNOSIS — E781 Pure hyperglyceridemia: Secondary | ICD-10-CM | POA: Diagnosis not present

## 2021-09-21 DIAGNOSIS — F1021 Alcohol dependence, in remission: Secondary | ICD-10-CM | POA: Diagnosis not present

## 2021-09-21 DIAGNOSIS — Z1211 Encounter for screening for malignant neoplasm of colon: Secondary | ICD-10-CM

## 2021-09-21 DIAGNOSIS — Z2821 Immunization not carried out because of patient refusal: Secondary | ICD-10-CM

## 2021-09-21 NOTE — Patient Instructions (Signed)
High Triglycerides Eating Plan ?Triglycerides are a type of fat in the blood. High levels of triglycerides can increase your risk of heart disease and stroke. If your triglyceride levels are high, choosing the right foods can help lower your triglycerides and keep your heart healthy. Work with your health care provider or a dietitian to develop an eating plan that is right for you. ?What are tips for following this plan? ?General guidelines ? ?Lose weight, if you are overweight. For most people, losing 5-10 lb (2-5 kg) helps lower triglyceride levels. A weight-loss plan may include: ?30 minutes of exercise at least 5 days a week. ?Reducing the amount of calories, sugar, and fat you eat. ?Eat a wide variety of fresh fruits, vegetables, and whole grains. These foods are high in fiber. ?Eat foods that contain healthy fats, such as fatty fish, nuts, seeds, and olive oil. ?Avoid foods that are high in added sugar, added salt (sodium), and saturated fat. ?Avoid low-fiber, refined carbohydrates such as white bread, crackers, noodles, and white rice. ?Avoid foods with trans fats or partially hydrogenated oils, such as fried foods or stick margarine. ?If you drink alcohol: ?Limit how much you have to: ?0-1 drink a day for women who are not pregnant. ?0-2 drinks a day for men. ?Your health care provider may recommend that you drink less than these amounts depending on your overall health. ?Know how much alcohol is in a drink. In the [place] one drink equals one 12 oz bottle of beer (355 mL), one 5 oz glass of wine (148 mL), or one 1? oz glass of hard liquor (44 mL). ?Reading food labels ?Check food labels for: ?The amount of saturated fat. Choose foods with no or very little saturated fat (less than 2 g). ?The amount of trans fat. Choose foods with no transfat. ?The amount of cholesterol. Choose foods that are low in cholesterol. ?The amount of sodium. Choose foods with less than 140 milligrams (mg) per serving. ?Shopping ?Buy  dairy products labeled as nonfat (skim) or low-fat (1%). ?Avoid buying processed or prepackaged foods. These are often high in added sugar, sodium, and fat. ?Cooking ?Choose healthy fats when cooking, such as olive oil, avocado oil, or canola oil. ?Cook foods using lower fat methods, such as baking, broiling, boiling, or grilling. ?Make your own sauces, dressings, and marinades when possible, instead of buying them. Store-bought sauces, dressings, and marinades are often high in sodium and sugar. ?Meal planning ?Eat more home-cooked food and less restaurant, buffet, and fast food. ?Eat fatty fish at least 2 times each week. Examples of fatty fish include salmon, trout, sardines, mackerel, tuna, and herring. ?If you eat whole eggs, do not eat more than 4 egg yolks per week. ?What foods should I eat? ?Fruits ?All fresh, canned (in natural juice), or frozen fruits. ?Vegetables ?Fresh or frozen vegetables. Low-sodium canned vegetables. ?Grains ?Whole wheat or whole grain breads, crackers, cereals, and pasta. Unsweetened oatmeal. Bulgur. Barley. Quinoa. Brown rice. Whole wheat flour tortillas. ?Meats and other proteins ?Skinless chicken or turkey. Ground chicken or turkey. Lean cuts of pork, trimmed of fat. Fish and seafood, especially salmon, trout, and herring. Egg whites. Dried beans, peas, or lentils. Unsalted nuts or seeds. Unsalted canned beans. Natural peanut or almond butter or other nut butters. ?Dairy ?Low-fat dairy products. Skim or low-fat (1%) milk. Reduced fat (2%) and low-sodium cheese. Low-fat ricotta cheese. Low-fat cottage cheese. Plain, low-fat yogurt. ?Fats and oils ?Tub margarine without trans fats. Light or reduced-fat mayonnaise. Light or   reduced-fat salad dressings. Avocado. Safflower, olive, sunflower, soybean, and canola oils. ?The items listed above may not be a complete list of recommended foods and beverages. Talk with your dietitian about what dietary choices are best for you. ?What foods  should I avoid? ?Fruits ?Sweetened dried fruit. Canned fruit in syrup. Fruit juice. ?Vegetables ?Creamed or fried vegetables. Vegetables in a cheese sauce. ?Grains ?White bread. White (regular) pasta. White rice. Cornbread. Bagels. Pastries. Crackers that contain trans fat. ?Meats and other proteins ?Fatty cuts of meat. Ribs. Chicken wings. Bacon. Sausage. Bologna. Salami. Chitterlings. Fatback. Hot dogs. Bratwurst. Packaged lunch meats. ?Dairy ?Whole or reduced-fat (2%) milk. Half-and-half. Cream cheese. Full-fat or sweetened yogurt. Full-fat cheese. Nondairy creamers. Whipped toppings. Processed cheese or cheese spreads. Cheese curds. ?Fats and oils ?Butter. Stick margarine. Lard. Shortening. Ghee. Bacon fat. Tropical oils, such as coconut, palm kernel, or palm oils. ?Beverages ?Alcohol. Sweetened drinks, such as soda, lemonade, fruit drinks, or punches. ?Sweets and desserts ?Corn syrup. Sugars. Honey. Molasses. Candy. Jam and jelly. Syrup. Sweetened cereals. Cookies. Pies. Cakes. Donuts. Muffins. Ice cream. ?Condiments ?Store-bought sauces, dressings, and marinades that are high in sugar, such as ketchup and barbecue sauce. ?The items listed above may not be a complete list of foods and beverages you should avoid. Talk with your dietitian about what dietary choices are best for you. ?Summary ?High levels of triglycerides can increase the risk of heart disease and stroke. Choosing the right foods can help lower your triglycerides. ?Eat plenty of fresh fruits, vegetables, and whole grains. Choose low-fat dairy and lean meats. Eat fatty fish at least twice a week. ?Avoid processed and prepackaged foods with added sugar, sodium, saturated fat, and trans fat. ?If you need suggestions or have questions about what types of food are good for you, talk with your health care provider or a dietitian. ?This information is not intended to replace advice given to you by your health care provider. Make sure you discuss any  questions you have with your health care provider. ?Document Revised: 05/09/2020 Document Reviewed: 05/09/2020 ?Elsevier Patient Education ? 2023 Elsevier Inc. ? ?

## 2021-09-21 NOTE — Progress Notes (Signed)
Patient ID: Danny West, adult    DOB: 1975/09/17  MRN: [id number]  CC: chronic ds management  Subjective: Danny West is a 46 y.o. adult who presents for chronic ds management Lenzy Porcaro's concerns today include:  This is a new pt visit.  Hx of MDD, ETOH abuse, chew snuff,  hypothyroid, HL, asthma  ETOH abuse:  had recent relapse and went for detox at [facility] for 5 days Dischg on Naltrexone.  It has decreased his craving.  Also dischg on Gabapentin; he is taking it.  Makes him feel "kinda loopy."  Does not make him drowsiness.  He stepped it down to BID instead of three times a day and tolerating it better. Will purchase Thiamine OTC.  He has remained ETOH free since dischg. Still seeing his therapist Sug[place].  Was seeing Dr. Rowe Robert, psychiatrist, at [facility] but he is no longer there. Would like to know if I would take over prescribing the Zoloft which he takes for depression.   Last saw Dr. Rowe Robert  09/02/21 and was given a 3 mth supply of the Zoloft 100 mg.  Increased to 150 mg on hosp discgh so he has been taking 1.5 of the 100 mg tab.  Doing okay on Zoloft -feels like he startles easily  Thyroid:  TSH  level was 44 eight days ago.  Reports compliance with Levothyroxine 125 mcg daily even during his recent ETOH relapse.  Triglyceride level during recent behavioral health admission was 718.   HM:  Did get Cologuard Kit but think it was misplaced on recent move. Okay with getting c-scope instead.  Declines flu shot   Patient Active Problem List   Diagnosis Date Noted   H/O suicide attempt 09/14/2021   Alcohol use disorder, severe, dependence ([place]) 09/13/2021   Snuff user 03/05/2021   Mild intermittent asthma without complication 39/53/2023   Acquired hypothyroidism 03/05/2021   Moderate major depression ([place]) 03/05/2021   Severe episode of recurrent major depressive disorder, without psychotic features ([place]) 01/20/2021   Cellulitis of left lower extremity 01/20/2021     Current  Outpatient Medications on File Prior to Visit  Medication Sig Dispense Refill   gabapentin (NEURONTIN) 300 MG capsule Take 1 capsule (300 mg total) by mouth 3 (three) times daily. 90 capsule 0   levothyroxine (SYNTHROID) 125 MCG tablet TAKE 1 TABLET BY MOUTH EVERY DAY BEFORE BREAKFAST 30 tablet 0   naltrexone (DEPADE) 50 MG tablet Take 1 tablet (50 mg total) by mouth daily. 30 tablet 0   sertraline (ZOLOFT) 100 MG tablet Take 1.5 tablets (150 mg total) by mouth daily. 45 tablet 0   thiamine (VITAMIN B-1) 100 MG tablet Take 1 tablet (100 mg total) by mouth daily.     traZODone (DESYREL) 50 MG tablet Take 0.5 tablets (25 mg total) by mouth at bedtime as needed for sleep (Can take additional half tablet after 10mns if still unable to sleep. Can also break into quarters if half tablet (232m makes you too sleepy). 30 tablet 0   No current facility-administered medications on file prior to visit.    No Known Allergies  Social History   Socioeconomic History   Marital status: Divorced    Spouse name: Not on file   Number of children: 2   Years of education: 12   Highest education level: Not on file  Occupational History   Not on file  Tobacco Use   Smoking status: Never   Smokeless tobacco: Current    Types: Snuff  Vaping Use   Vaping Use: Never used  Substance and Sexual Activity   Alcohol use: Yes    Comment: drinks (4) 16 oz beers daily   Drug use: Never   Sexual activity: Not Currently  Other Topics Concern   Not on file  Social History Narrative   Pt lives alone and works at [employer]. Divorced 11/2019; twin boys are 48 years old   Social Determinants of Radio broadcast assistant Strain: Not on file  Food Insecurity: Not on file  Transportation Needs: Not on file  Physical Activity: Not on file  Stress: Not on file  Social Connections: Not on file  Intimate Partner Violence: Not on file    No family history on file.  Past Surgical History:  Procedure Laterality Date    NO PAST SURGERIES      ROS: Review of Systems Negative except as stated above  PHYSICAL EXAM: BP 114/73   Pulse 70   Temp 98.1 F (36.7 C) (Oral)   Ht _0  (1.702 m)   Wt 178 lb 3.2 oz (80.8 kg)   SpO2 99%   BMI 27.91 kg/m   Physical Exam  General appearance -middle-age Caucasian male in NAD Mental status - normal mood, behavior, speech, dress, motor activity, and thought processes Neck - supple, no significant adenopathy Chest - clear to auscultation, no wheezes, rales or rhonchi, symmetric air entry Heart - normal rate, regular rhythm, normal S1, S2, no murmurs, rubs, clicks or gallops Extremities - peripheral pulses normal, no pedal edema, no clubbing or cyanosis     09/19/2021   10:07 AM 09/13/2021    1:15 PM 05/18/2021   10:41 AM  Depression screen PHQ 2/9  Decreased Interest 1 1 0  Down, Depressed, Hopeless 0 2 0  PHQ - 2 Score 1 3 0  Altered sleeping 2 2 0  Tired, decreased energy 2 2 0  Change in appetite _1 Feeling bad or failure about yourself  0 2 0  Trouble concentrating 0 0 1  Moving slowly or fidgety/restless 0 0 1  Suicidal thoughts 0 0 0  PHQ-9 Score _2 Difficult doing work/chores Somewhat difficult Somewhat difficult Somewhat difficult       Latest Ref Rng & Units 09/13/2021    1:45 PM 05/26/2021    4:46 PM 05/05/2021    5:32 PM  CMP  Glucose 70 - 99 mg/dL 74  128  137   BUN 6 - 20 mg/dL _3 Creatinine 0.61 - 1.24 mg/dL 0.64  0.73  0.76   Sodium 135 - 145 mmol/L 138  136  136   Potassium 3.5 - 5.1 mmol/L 3.9  3.7  3.7   Chloride 98 - 111 mmol/L 103  100  100   CO2 22 - 32 mmol/L _4 Calcium 8.9 - 10.3 mg/dL 9.0  8.9  9.4   Total Protein 6.5 - 8.1 g/dL 7.1  7.5    Total Bilirubin 0.3 - 1.2 mg/dL 1.2  0.7    Alkaline Phos 38 - 126 U/L 103  116    AST 15 - 41 U/L 55  22    ALT 0 - 44 U/L 27  13     Lipid Panel     Component Value Date/Time   CHOL 226 (H) 09/13/2021 1348   TRIG 718 (H) 09/13/2021 1348   HDL 53  09/13/2021 1348  CHOLHDL 4.3 09/13/2021 1348   VLDL UNABLE TO CALCULATE IF TRIGLYCERIDE OVER 400 mg/dL 09/13/2021 1348   LDLCALC UNABLE TO CALCULATE IF TRIGLYCERIDE OVER 400 mg/dL 09/13/2021 1348   LDLDIRECT 71 09/13/2021 1348    CBC    Component Value Date/Time   WBC 5.1 09/13/2021 1345   RBC 4.63 09/13/2021 1345   HGB 15.2 09/13/2021 1345   HCT 42.4 09/13/2021 1345   PLT 167 09/13/2021 1345   MCV 91.6 09/13/2021 1345   MCH 32.8 09/13/2021 1345   MCHC 35.8 09/13/2021 1345   RDW 12.4 09/13/2021 1345   LYMPHSABS 1.6 09/13/2021 1345   MONOABS 0.4 09/13/2021 1345   EOSABS 0.1 09/13/2021 1345   BASOSABS 0.1 09/13/2021 1345    ASSESSMENT AND PLAN:  1. Alcohol use disorder, severe, in early remission, dependence ([place]) Commended him on getting himself help and going through detox.  Currently on naltrexone and gabapentin both of which she feels help.  Encouraged him to remain free of alcohol.  Encouraged him to consider joining an [place] group.  He plans to do so and is looking into a sponsor.  He will continue seeing his therapist. - Hepatic Function Panel  2. Acquired hypothyroidism We will recheck TSH level today.  If still elevated we will need to increase the dose of the levothyroxine. - TSH  3. Hypertriglyceridemia Encouraged him to remain free of alcohol. Healthy eating habits discussed and encouraged. - Lipid panel  4. Mild recurrent major depression (HCC) Continue Zoloft 150 mg daily.  5. Screening for colon cancer - Ambulatory referral to Gastroenterology  6. Influenza vaccination declined Recommended.  Patient declined.    Patient was given the opportunity to ask questions.  Patient verbalized understanding of the plan and was able to repeat key elements of the plan.   This documentation was completed using Radio producer.  Any transcriptional errors are unintentional.  No orders of the defined types were placed in this  encounter.    Requested Prescriptions    No prescriptions requested or ordered in this encounter    No follow-ups on file.  Karle Plumber, MD, FACP

## 2021-09-22 ENCOUNTER — Other Ambulatory Visit: Payer: Self-pay | Admitting: Internal Medicine

## 2021-09-22 DIAGNOSIS — E039 Hypothyroidism, unspecified: Secondary | ICD-10-CM

## 2021-09-22 LAB — HEPATIC FUNCTION PANEL
ALT: 19 IU/L (ref 0–44)
AST: 21 IU/L (ref 0–40)
Albumin: 4.2 g/dL (ref 4.1–5.1)
Alkaline Phosphatase: 105 IU/L (ref 44–121)
Bilirubin Total: 0.4 mg/dL (ref 0.0–1.2)
Bilirubin, Direct: 0.25 mg/dL (ref 0.00–0.40)
Total Protein: 6.2 g/dL (ref 6.0–8.5)

## 2021-09-22 LAB — LIPID PANEL
Chol/HDL Ratio: 3.5 ratio (ref 0.0–5.0)
Cholesterol, Total: 160 mg/dL (ref 100–199)
HDL: 46 mg/dL (ref >39)
LDL Chol Calc (NIH): 63 mg/dL (ref 0–99)
Triglycerides: 326 mg/dL — ABNORMAL HIGH (ref 0–149)
VLDL Cholesterol Cal: 51 mg/dL — ABNORMAL HIGH (ref 5–40)

## 2021-09-22 LAB — TSH: TSH: 96.2 u[IU]/mL — ABNORMAL HIGH (ref 0.450–4.500)

## 2021-09-22 MED ORDER — LEVOTHYROXINE SODIUM 175 MCG PO TABS: 175.0000 ug | ORAL_TABLET | Freq: Every day | ORAL | 1 refills | Status: DC

## 2021-09-27 ENCOUNTER — Other Ambulatory Visit: Payer: Self-pay | Admitting: Internal Medicine

## 2021-09-28 NOTE — Telephone Encounter (Signed)
Dose inconsistent with current med list.  Requested Prescriptions  Pending Prescriptions Disp Refills  . levothyroxine (SYNTHROID) 125 MCG tablet [Pharmacy Med Name: LEVOTHYROXINE 125 MCG TABLET] 30 tablet 0    Sig: TAKE 1 TABLET BY MOUTH EVERY DAY BEFORE BREAKFAST     Endocrinology:  Hypothyroid Agents Failed - 09/27/2021 10:33 AM      Failed - TSH in normal range and within 360 days    TSH  Date Value Ref Range Status  09/21/2021 96.200 (H) 0.450 - 4.500 uIU/mL Final         Passed - Valid encounter within last 12 months    Recent Outpatient Visits          1 week ago Alcohol use disorder, severe, in early remission, dependence ([place])   [place], MD   4 months ago Acquired hypothyroidism   [place], MD   6 months ago Establishing care with new doctor, encounter for   [place], MD   5 years ago Acute right ankle pain   [facility], Reather Laurence, PA-C      Future Appointments            In 3 months Wynetta Emery, Dalbert Batman, MD [place]

## 2021-11-26 ENCOUNTER — Other Ambulatory Visit: Payer: Self-pay | Admitting: Internal Medicine

## 2021-11-26 DIAGNOSIS — E039 Hypothyroidism, unspecified: Secondary | ICD-10-CM

## 2021-12-09 ENCOUNTER — Encounter (HOSPITAL_BASED_OUTPATIENT_CLINIC_OR_DEPARTMENT_OTHER): Payer: Self-pay

## 2021-12-09 ENCOUNTER — Other Ambulatory Visit: Payer: Self-pay

## 2021-12-09 ENCOUNTER — Emergency Department (HOSPITAL_BASED_OUTPATIENT_CLINIC_OR_DEPARTMENT_OTHER): Payer: BC Managed Care – PPO

## 2021-12-09 ENCOUNTER — Emergency Department (HOSPITAL_BASED_OUTPATIENT_CLINIC_OR_DEPARTMENT_OTHER)
Admission: EM | Admit: 2021-12-09 | Discharge: 2021-12-09 | Disposition: A | Discharge status: Home / Self Care | Payer: BC Managed Care – PPO | Attending: Emergency Medicine | Admitting: Emergency Medicine

## 2021-12-09 DIAGNOSIS — R059 Cough, unspecified: Secondary | ICD-10-CM | POA: Diagnosis present

## 2021-12-09 DIAGNOSIS — J3489 Other specified disorders of nose and nasal sinuses: Secondary | ICD-10-CM | POA: Insufficient documentation

## 2021-12-09 DIAGNOSIS — J069 Acute upper respiratory infection, unspecified: Secondary | ICD-10-CM | POA: Insufficient documentation

## 2021-12-09 MED ORDER — BENZONATATE 100 MG PO CAPS: 100.0000 mg | ORAL_CAPSULE | Freq: Three times a day (TID) | ORAL | 0 refills | Status: DC

## 2021-12-09 MED ORDER — ALBUTEROL SULFATE HFA 108 (90 BASE) MCG/ACT IN AERS
2.0000 | INHALATION_SPRAY | Freq: Once | RESPIRATORY_TRACT | Status: AC
  Administered 2021-12-09: 2 via RESPIRATORY_TRACT
  Filled 2021-12-09: qty 6.7

## 2021-12-09 MED ORDER — ONDANSETRON 4 MG PO TBDP: ORAL_TABLET | ORAL | 0 refills | Status: DC

## 2021-12-09 NOTE — ED Notes (Signed)
Discharge paperwork given and verbally understood. 

## 2021-12-09 NOTE — ED Triage Notes (Signed)
Pt states has a bad headache, states he is having chills, and pain everywhere X2 days. Endorses vomiting and diarrhea.

## 2021-12-09 NOTE — Discharge Instructions (Addendum)
Take tylenol 2 pills 4 times a day and motrin 4 pills 3 times a day.  Drink plenty of fluids.  Return for worsening shortness of breath, headache, confusion. Follow up with your family doctor.    Zoroastrianism is an ancient [place] religion that may have originated as early as 4,000 years ago. Arguably the world's first monotheistic faith, it's one of the oldest religions still in existence. Zoroastrianism was the state religion of three [place] dynasties, until the Muslim conquest of [place] in the seventh century A.D. Zoroastrian refugees, called Parsis, escaped Muslim persecution in [place] by emigrating to [place]. Zoroastrianism now has an estimated 100,000 to 200,000 worshipers worldwide, and is practiced today as a minority religion in parts of [place] and [place].

## 2021-12-09 NOTE — ED Provider Notes (Signed)
[name] EMERGENCY DEPT Provider Note   CSN: [id number] Arrival date & time: 12/09/21  0900     History  Chief Complaint  Patient presents with   Headache   Cough    Danny West is a 46 y.o. adult.  46 yo M with a chief complaints of cough congestion nausea vomiting diarrhea chills going on for about a day or 2.  He had a woman at work who he thinks was sick with COVID last week.  He does not think that he has COVID.  Has been coughing a bit.  Having trouble sleeping with this illness.  Came in to be evaluated.   Headache      Home Medications Prior to Admission medications   Medication Sig Start Date End Date Taking? Authorizing Provider  benzonatate (TESSALON) 100 MG capsule Take 1 capsule (100 mg total) by mouth every 8 (eight) hours. 12/09/21  Yes Melene Plan, DO  ondansetron (ZOFRAN-ODT) 4 MG disintegrating tablet 4mg  ODT q4 hours prn nausea/vomit 12/09/21  Yes 12/11/21, DO  gabapentin (NEURONTIN) 300 MG capsule Take 1 capsule (300 mg total) by mouth 3 (three) times daily. 09/17/21 10/17/21  12/17/21, DO  levothyroxine (SYNTHROID) 175 MCG tablet TAKE 1 TABLET BY MOUTH DAILY BEFORE BREAKFAST. 11/26/21   11/28/21, MD  sertraline (ZOLOFT) 100 MG tablet Take 1.5 tablets (150 mg total) by mouth daily. 09/18/21 10/18/21  12/18/21, DO  thiamine (VITAMIN B-1) 100 MG tablet Take 1 tablet (100 mg total) by mouth daily. 09/17/21   11/17/21, DO  traZODone (DESYREL) 50 MG tablet Take 0.5 tablets (25 mg total) by mouth at bedtime as needed for sleep (Can take additional half tablet after Princess Bruins if still unable to sleep. Can also break into quarters if half tablet (25mg ) makes you too sleepy). 09/17/21 10/17/21  11/17/21, DO      Allergies    Patient has no known allergies.    Review of Systems   Review of Systems  Neurological:  Positive for headaches.    Physical Exam Updated Vital Signs BP (!) 114/97 (BP Location: Right Arm)   Pulse 94    Temp 98.7 F (37.1 C) (Oral)   Resp 18   Ht 5\' 7"  (1.702 m)   Wt 80.8 kg   SpO2 98%   BMI 27.90 kg/m  Physical Exam Vitals and nursing note reviewed.  Constitutional:      General: Danny West is not in acute distress.    Appearance: Danny West is well-developed. [name] is not diaphoretic.  HENT:     Head: Normocephalic and atraumatic.  Eyes:     Pupils: Pupils are equal, round, and reactive to light.  Cardiovascular:     Rate and Rhythm: Normal rate and regular rhythm.     Heart sounds: No murmur heard.    No friction rub. No gallop.  Pulmonary:     Effort: Pulmonary effort is normal.     Breath sounds: Rhonchi present. No wheezing or rales.     Comments: Right-sided rhonchi that does not clear with coughing Abdominal:     General: There is no distension.     Palpations: Abdomen is soft.     Tenderness: There is no abdominal tenderness.  Musculoskeletal:        General: No tenderness.     Cervical back: Normal range of motion and neck supple.  Skin:    General: Skin is warm and dry.  Neurological:  Mental Status: Danny West is alert and oriented to person, place, and time.  Psychiatric:        Behavior: Behavior normal.     ED Results / Procedures / Treatments   Labs (all labs ordered are listed, but only abnormal results are displayed) Labs Reviewed - No data to display  EKG None  Radiology DG Chest [facility] 1 View  Result Date: 12/09/2021 CLINICAL DATA:  Bad headache, cough, pain everywhere EXAM: PORTABLE CHEST 1 VIEW COMPARISON:  08/15/2021, 10/02/2020 FINDINGS: Mild bilateral interstitial prominence likely chronic similar in appearance to 10/02/2020. No focal consolidation. No pleural effusion or pneumothorax. Heart and mediastinal contours are unremarkable. No acute osseous abnormality. IMPRESSION: No active disease. Electronically Signed   By: Elige Ko M.D.   On: 12/09/2021 09:44    Procedures Procedures    Medications Ordered in  ED Medications  albuterol (VENTOLIN HFA) 108 (90 Base) MCG/ACT inhaler 2 puff (2 puffs Inhalation Given 12/09/21 0930)    ED Course/ Medical Decision Making/ A&P                           Medical Decision Making Amount and/or Complexity of Data Reviewed Radiology: ordered.  Risk Prescription drug management.   46 yo M with a chief complaints of cough congestion fever chills myalgias nausea vomiting diarrhea headache going on for about 48 hours or so.  Patient is well-appearing and nontoxic.  No obvious difficulty breathing.  He does have right-sided rhonchi on exam.  Will obtain a chest x-ray.  He has lost his rescue inhaler and we will give him 1 here.  Likely treat as a viral syndrome.  PCP follow-up.  CXR independently interpreted by me without focal infiltrate or pneumothorax.  Will discharge home.  She has viral syndrome.  PCP follow-up.  9:53 AM:  I have discussed the diagnosis/risks/treatment options with the patient.  Evaluation and diagnostic testing in the emergency department does not suggest an emergent condition requiring admission or immediate intervention beyond what has been performed at this time.  They will follow up with PCP. We also discussed returning to the ED immediately if new or worsening sx occur. We discussed the sx which are most concerning (e.g., sudden worsening pain, fever, inability to tolerate by mouth) that necessitate immediate return. Medications administered to the patient during their visit and any new prescriptions provided to the patient are listed below.  Medications given during this visit Medications  albuterol (VENTOLIN HFA) 108 (90 Base) MCG/ACT inhaler 2 puff (2 puffs Inhalation Given 12/09/21 0930)     The patient appears reasonably screen and/or stabilized for discharge and I doubt any other medical condition or other [facility] requiring further screening, evaluation, or treatment in the ED at this time prior to discharge.          Final  Clinical Impression(s) / ED Diagnoses Final diagnoses:  Viral URI with cough    Rx / DC Orders ED Discharge Orders          Ordered    benzonatate (TESSALON) 100 MG capsule  Every 8 hours        12/09/21 0915    ondansetron (ZOFRAN-ODT) 4 MG disintegrating tablet        12/09/21 0915              Melene Plan, DO 12/09/21 [phone]

## 2021-12-23 ENCOUNTER — Other Ambulatory Visit: Payer: Self-pay | Admitting: Internal Medicine

## 2021-12-23 DIAGNOSIS — E039 Hypothyroidism, unspecified: Secondary | ICD-10-CM

## 2022-01-22 ENCOUNTER — Ambulatory Visit: Payer: BC Managed Care – PPO | Attending: Internal Medicine | Admitting: Internal Medicine

## 2022-01-22 ENCOUNTER — Encounter: Payer: Self-pay | Admitting: Internal Medicine

## 2022-01-22 VITALS — BP 120/81 | HR 78 | Temp 98.2°F | Ht 67.0 in | Wt 183.0 lb

## 2022-01-22 DIAGNOSIS — E039 Hypothyroidism, unspecified: Secondary | ICD-10-CM | POA: Diagnosis not present

## 2022-01-22 DIAGNOSIS — E781 Pure hyperglyceridemia: Secondary | ICD-10-CM

## 2022-01-22 DIAGNOSIS — Z1211 Encounter for screening for malignant neoplasm of colon: Secondary | ICD-10-CM

## 2022-01-22 DIAGNOSIS — F330 Major depressive disorder, recurrent, mild: Secondary | ICD-10-CM

## 2022-01-22 DIAGNOSIS — F1021 Alcohol dependence, in remission: Secondary | ICD-10-CM | POA: Diagnosis not present

## 2022-01-22 NOTE — Progress Notes (Signed)
Patient ID: Kavin Weckwerth, adult    DOB: [birth year]  MRN: [id number]  CC: Follow-up (F/u on alcoholism. Med refill./Pt taking naproxen, unsure of dosage & which one.)   Subjective: Danny West is a 47 y.o. adult who presents for chronic ds management [place] Strain's concerns today include:  Hx of MDD, ETOH abuse, chew snuff,  hypothyroid, HL, asthma  ETOH:  still taking Naltrexone 50 mg 1/2 tab daily.  Full tab makes him feel oozy so he started taking 1/2 tab.  Will try to go back to full take this wkend [place] still controlled with 1/2 tab -had mild relapse for 2 days over the holidays -takes only 1/2 tab of Trazodone 50 mg at nights; less hang over in a.m -taking Zoloft 150 mg daily.  Stressed about money and not seeing his kids.  His ex-wife keeping his 2 children from him.  Plans to take her to court -still sees his therapist every wk.  Did not get in with AA.  Plans to touch base this wkend to try to get in with an AA group  Hypothyroid:  TSH was 96 when last checked.  Levothyroxine was increased from 125 mcg daily to 175 mcg daily.  We will check TSH today.  He reports compliance with taking the medication.  HL: Triglyceride level had improved from 718 to328 on last visit.   HM:  did get call from GI but did not call them back.  States it was during his period of ETOH relapse.  He would like to do the Cologuard instead.  He promises this time to use it. Patient Active Problem List   Diagnosis Date Noted   H/O suicide attempt 09/14/2021   Alcohol use disorder, severe, dependence (HCC) 09/13/2021   Snuff user 03/05/2021   Mild intermittent asthma without complication 03/05/2021   Acquired hypothyroidism 03/05/2021   Moderate major depression (HCC) 03/05/2021   Severe episode of recurrent major depressive disorder, without psychotic features (HCC) 01/20/2021   Cellulitis of left lower extremity 01/20/2021     Current Outpatient Medications on File Prior to Visit  Medication  Sig Dispense Refill   levothyroxine (SYNTHROID) 175 MCG tablet TAKE 1 TABLET BY MOUTH EVERY DAY BEFORE BREAKFAST 30 tablet 0   naltrexone (DEPADE) 50 MG tablet Take 25 mg by mouth daily.     thiamine (VITAMIN B-1) 100 MG tablet Take 1 tablet (100 mg total) by mouth daily.     ondansetron (ZOFRAN-ODT) 4 MG disintegrating tablet 4mg  ODT q4 hours prn nausea/vomit (Patient not taking: Reported on 01/22/2022) 20 tablet 0   sertraline (ZOLOFT) 100 MG tablet Take 1.5 tablets (150 mg total) by mouth daily. 45 tablet 0   traZODone (DESYREL) 50 MG tablet Take 0.5 tablets (25 mg total) by mouth at bedtime as needed for sleep (Can take additional half tablet after 03/23/2022 if still unable to sleep. Can also break into quarters if half tablet (25mg ) makes you too sleepy). 30 tablet 0   No current facility-administered medications on file prior to visit.    No Known Allergies  Social History   Socioeconomic History   Marital status: Divorced    Spouse name: Not on file   Number of children: 2   Years of education: 12   Highest education level: Not on file  Occupational History   Not on file  Tobacco Use   Smoking status: Never   Smokeless tobacco: Current    Types: Snuff  Vaping Use   Vaping Use:  Never used  Substance and Sexual Activity   Alcohol use: Yes    Comment: drinks (4) 16 oz beers daily   Drug use: Never   Sexual activity: Not Currently  Other Topics Concern   Not on file  Social History Narrative   Pt lives alone and works at [employer]. Divorced 11/2019; twin boys are 49 years old   Social Determinants of Radio broadcast assistant Strain: Not on file  Food Insecurity: Not on file  Transportation Needs: Not on file  Physical Activity: Not on file  Stress: Not on file  Social Connections: Not on file  Intimate Partner Violence: Not on file    No family history on file.  Past Surgical History:  Procedure Laterality Date   NO PAST SURGERIES      ROS: Review of  Systems Negative except as stated above  PHYSICAL EXAM: BP 120/81 (BP Location: Left Arm, Patient Position: Sitting, Cuff Size: Normal)   Pulse 78   Temp 98.2 F (36.8 C) (Oral)   Ht 5\' 7"  (1.702 m)   Wt 183 lb (83 kg)   SpO2 100%   BMI 28.66 kg/m   Physical Exam   General appearance - alert, well appearing, middle-age Caucasian male and in no distress Mental status - normal mood, behavior, speech, dress, motor activity, and thought processes Neck - supple, no significant adenopathy Chest - clear to auscultation, no wheezes, rales or rhonchi, symmetric air entry Heart - normal rate, regular rhythm, normal S1, S2, no murmurs, rubs, clicks or gallops Extremities - peripheral pulses normal, no pedal edema, no clubbing or cyanosis    01/22/2022   10:00 AM 09/19/2021   10:07 AM 09/13/2021    1:15 PM  Depression screen PHQ 2/9  Decreased Interest 0    Down, Depressed, Hopeless 0    PHQ - 2 Score 0    Altered sleeping 0    Tired, decreased energy 0    Change in appetite 0    Feeling bad or failure about yourself  1    Trouble concentrating 0    Moving slowly or fidgety/restless 0    Suicidal thoughts 0    PHQ-9 Score 1    Difficult doing work/chores        Information is confidential and restricted. Go to Review Flowsheets to unlock data.       Latest Ref Rng & Units 09/21/2021   11:11 AM 09/13/2021    1:45 PM 05/26/2021    4:46 PM  CMP  Glucose 70 - 99 mg/dL  74  128   BUN 6 - 20 mg/dL  8  9   Creatinine 0.61 - 1.24 mg/dL  0.64  0.73   Sodium 135 - 145 mmol/L  138  136   Potassium 3.5 - 5.1 mmol/L  3.9  3.7   Chloride 98 - 111 mmol/L  103  100   CO2 22 - 32 mmol/L  24  21   Calcium 8.9 - 10.3 mg/dL  9.0  8.9   Total Protein 6.0 - 8.5 g/dL 6.2  7.1  7.5   Total Bilirubin 0.0 - 1.2 mg/dL 0.4  1.2  0.7   Alkaline Phos 44 - 121 IU/L 105  103  116   AST 0 - 40 IU/L 21  55  22   ALT 0 - 44 IU/L 19  27  13     Lipid Panel     Component Value Date/Time   CHOL 160 09/21/2021  1111   TRIG 326 (H) 09/21/2021 1111   HDL 46 09/21/2021 1111   CHOLHDL 3.5 09/21/2021 1111   CHOLHDL 4.3 09/13/2021 1348   VLDL UNABLE TO CALCULATE IF TRIGLYCERIDE OVER 400 mg/dL 09/13/2021 1348   LDLCALC 63 09/21/2021 1111   LDLDIRECT 71 09/13/2021 1348    CBC    Component Value Date/Time   WBC 5.1 09/13/2021 1345   RBC 4.63 09/13/2021 1345   HGB 15.2 09/13/2021 1345   HCT 42.4 09/13/2021 1345   PLT 167 09/13/2021 1345   MCV 91.6 09/13/2021 1345   MCH 32.8 09/13/2021 1345   MCHC 35.8 09/13/2021 1345   RDW 12.4 09/13/2021 1345   LYMPHSABS 1.6 09/13/2021 1345   MONOABS 0.4 09/13/2021 1345   EOSABS 0.1 09/13/2021 1345   BASOSABS 0.1 09/13/2021 1345    ASSESSMENT AND PLAN:  1. Alcohol use disorder, severe, in early remission, dependence ([place]) Patient on naltrexone and finds it helpful. He will try to get in with an AA group for support and helping to maintain his sobriety.  2. Mild recurrent major depression ([place]) Plugged in with a counselor.  Continue Zoloft.  3. Acquired hypothyroidism Continue levothyroxine 175 mcg daily.  Check TSH today. - TSH  4. Hypertriglyceridemia Improved.  Encourage healthy eating habits.  5. Screening for colon cancer - Cologuard    Patient was given the opportunity to ask questions.  Patient verbalized understanding of the plan and was able to repeat key elements of the plan.   This documentation was completed using Radio producer.  Any transcriptional errors are unintentional.  Orders Placed This Encounter  Procedures   Cologuard   TSH     Requested Prescriptions    No prescriptions requested or ordered in this encounter    Return in about 4 months (around 05/23/2022).  Karle Plumber, MD, FACP

## 2022-01-23 LAB — TSH: TSH: 36.1 u[IU]/mL — ABNORMAL HIGH (ref 0.450–4.500)

## 2022-01-24 ENCOUNTER — Telehealth: Payer: Self-pay | Admitting: Internal Medicine

## 2022-01-24 ENCOUNTER — Other Ambulatory Visit: Payer: Self-pay | Admitting: Internal Medicine

## 2022-01-24 DIAGNOSIS — E039 Hypothyroidism, unspecified: Secondary | ICD-10-CM

## 2022-01-24 MED ORDER — LEVOTHYROXINE SODIUM 200 MCG PO TABS: 200.0000 ug | ORAL_TABLET | Freq: Every day | ORAL | 4 refills | Status: DC

## 2022-01-24 NOTE — Telephone Encounter (Signed)
Phone call placed to patient this afternoon to give the results of his thyroid level.  Phone call was made to [phone].  The mailbox was full and I was unable to leave a message. Message sent to my CMA to try to reach him.

## 2022-03-05 ENCOUNTER — Other Ambulatory Visit: Payer: BC Managed Care – PPO

## 2022-03-09 ENCOUNTER — Ambulatory Visit (HOSPITAL_COMMUNITY)
Admission: EM | Admit: 2022-03-09 | Discharge: 2022-03-10 | Disposition: A | Discharge status: Rehabilitation Facility | Payer: BC Managed Care – PPO | Attending: Registered Nurse | Admitting: Registered Nurse

## 2022-03-09 ENCOUNTER — Encounter (HOSPITAL_COMMUNITY): Payer: Self-pay | Admitting: Registered Nurse

## 2022-03-09 DIAGNOSIS — F332 Major depressive disorder, recurrent severe without psychotic features: Secondary | ICD-10-CM | POA: Insufficient documentation

## 2022-03-09 DIAGNOSIS — F1019 Alcohol abuse with unspecified alcohol-induced disorder: Secondary | ICD-10-CM | POA: Diagnosis present

## 2022-03-09 DIAGNOSIS — Z79899 Other long term (current) drug therapy: Secondary | ICD-10-CM | POA: Diagnosis not present

## 2022-03-09 DIAGNOSIS — F1020 Alcohol dependence, uncomplicated: Secondary | ICD-10-CM | POA: Insufficient documentation

## 2022-03-09 DIAGNOSIS — F1094 Alcohol use, unspecified with alcohol-induced mood disorder: Secondary | ICD-10-CM | POA: Diagnosis not present

## 2022-03-09 DIAGNOSIS — Z1152 Encounter for screening for COVID-19: Secondary | ICD-10-CM | POA: Insufficient documentation

## 2022-03-09 DIAGNOSIS — Z9151 Personal history of suicidal behavior: Secondary | ICD-10-CM | POA: Insufficient documentation

## 2022-03-09 LAB — COMPREHENSIVE METABOLIC PANEL
ALT: 62 U/L — ABNORMAL HIGH (ref 0–44)
AST: 82 U/L — ABNORMAL HIGH (ref 15–41)
Albumin: 3.3 g/dL — ABNORMAL LOW (ref 3.5–5.0)
Alkaline Phosphatase: 101 U/L (ref 38–126)
Anion gap: 12 (ref 5–15)
BUN: 8 mg/dL (ref 6–20)
CO2: 25 mmol/L (ref 22–32)
Calcium: 8.9 mg/dL (ref 8.9–10.3)
Chloride: 98 mmol/L (ref 98–111)
Creatinine, Ser: 0.74 mg/dL (ref 0.61–1.24)
GFR, Estimated: >60 mL/min (ref >60)
Glucose, Bld: 86 mg/dL (ref 70–99)
Potassium: 4.3 mmol/L (ref 3.5–5.1)
Sodium: 135 mmol/L (ref 135–145)
Total Bilirubin: 1 mg/dL (ref 0.3–1.2)
Total Protein: 6.5 g/dL (ref 6.5–8.1)

## 2022-03-09 LAB — ETHANOL: Alcohol, Ethyl (B): 53 mg/dL — ABNORMAL HIGH (ref <10)

## 2022-03-09 LAB — CBC WITH DIFFERENTIAL/PLATELET
Abs Immature Granulocytes: 0.02 10*3/uL (ref 0.00–0.07)
Basophils Absolute: 0.1 10*3/uL (ref 0.0–0.1)
Basophils Relative: 1 %
Eosinophils Absolute: 0.1 10*3/uL (ref 0.0–0.5)
Eosinophils Relative: 2 %
HCT: 40 % (ref 39.0–52.0)
Hemoglobin: 14.5 g/dL (ref 13.0–17.0)
Immature Granulocytes: 0 %
Lymphocytes Relative: 34 %
Lymphs Abs: 1.8 10*3/uL (ref 0.7–4.0)
MCH: 32.7 pg (ref 26.0–34.0)
MCHC: 36.3 g/dL — ABNORMAL HIGH (ref 30.0–36.0)
MCV: 90.1 fL (ref 80.0–100.0)
Monocytes Absolute: 0.4 10*3/uL (ref 0.1–1.0)
Monocytes Relative: 7 %
Neutro Abs: 2.9 10*3/uL (ref 1.7–7.7)
Neutrophils Relative %: 56 %
Platelets: 133 10*3/uL — ABNORMAL LOW (ref 150–400)
RBC: 4.44 MIL/uL (ref 4.22–5.81)
RDW: 12.5 % (ref 11.5–15.5)
WBC: 5.3 10*3/uL (ref 4.0–10.5)
nRBC: 0 % (ref 0.0–0.2)

## 2022-03-09 LAB — POCT URINE DRUG SCREEN - MANUAL ENTRY (I-SCREEN)
POC Amphetamine UR: NOT DETECTED
POC Buprenorphine (BUP): NOT DETECTED
POC Cocaine UR: NOT DETECTED
POC Marijuana UR: NOT DETECTED
POC Methadone UR: NOT DETECTED
POC Methamphetamine UR: NOT DETECTED
POC Morphine: NOT DETECTED
POC Oxazepam (BZO): NOT DETECTED
POC Oxycodone UR: NOT DETECTED
POC Secobarbital (BAR): NOT DETECTED

## 2022-03-09 LAB — LIPID PANEL
Cholesterol: 475 mg/dL — ABNORMAL HIGH (ref 0–200)
LDL Cholesterol: UNDETERMINED mg/dL (ref 0–99)
Triglycerides: 2403 mg/dL — ABNORMAL HIGH (ref <150)
VLDL: UNDETERMINED mg/dL (ref 0–40)

## 2022-03-09 LAB — URINALYSIS, ROUTINE W REFLEX MICROSCOPIC
Bilirubin Urine: NEGATIVE
Glucose, UA: NEGATIVE mg/dL
Hgb urine dipstick: NEGATIVE
Ketones, ur: NEGATIVE mg/dL
Leukocytes,Ua: NEGATIVE
Nitrite: NEGATIVE
Protein, ur: NEGATIVE mg/dL
Specific Gravity, Urine: 1.015 (ref 1.005–1.030)
pH: 7.5 (ref 5.0–8.0)

## 2022-03-09 LAB — RESP PANEL BY RT-PCR (RSV, FLU A&B, COVID)  RVPGX2
Influenza A by PCR: NEGATIVE
Influenza B by PCR: NEGATIVE
Resp Syncytial Virus by PCR: NEGATIVE
SARS Coronavirus 2 by RT PCR: NEGATIVE

## 2022-03-09 LAB — LDL CHOLESTEROL, DIRECT: Direct LDL: 40 mg/dL (ref 0–99)

## 2022-03-09 LAB — TSH: TSH: 27.234 u[IU]/mL — ABNORMAL HIGH (ref 0.350–4.500)

## 2022-03-09 LAB — MAGNESIUM: Magnesium: 2.3 mg/dL (ref 1.7–2.4)

## 2022-03-09 MED ORDER — SERTRALINE HCL 100 MG PO TABS
100.0000 mg | ORAL_TABLET | Freq: Every day | ORAL | Status: DC
  Administered 2022-03-10: 100 mg via ORAL
  Filled 2022-03-09: qty 1

## 2022-03-09 MED ORDER — TRAZODONE HCL 50 MG PO TABS
25.0000 mg | ORAL_TABLET | Freq: Every evening | ORAL | Status: DC | PRN
  Administered 2022-03-09: 25 mg via ORAL
  Filled 2022-03-09: qty 1

## 2022-03-09 MED ORDER — MAGNESIUM HYDROXIDE 400 MG/5ML PO SUSP: 30.0000 mL | Freq: Every day | ORAL | Status: DC | PRN

## 2022-03-09 MED ORDER — ACETAMINOPHEN 325 MG PO TABS: 650.0000 mg | ORAL_TABLET | Freq: Four times a day (QID) | ORAL | Status: DC | PRN

## 2022-03-09 MED ORDER — ALUM & MAG HYDROXIDE-SIMETH 200-200-20 MG/5ML PO SUSP: 30.0000 mL | ORAL | Status: DC | PRN

## 2022-03-09 MED ORDER — LEVOTHYROXINE SODIUM 100 MCG PO TABS
200.0000 ug | ORAL_TABLET | Freq: Every day | ORAL | Status: DC
  Administered 2022-03-10: 200 ug via ORAL
  Filled 2022-03-09 (×2): qty 2

## 2022-03-09 NOTE — ED Notes (Signed)
Patient was not  covid swab @ admission for 15 min due to supplies being unavailable.

## 2022-03-09 NOTE — ED Notes (Signed)
Pt a/o x4. Denies SI/HI/AVH. Denies c/o withdrawal symptoms. Pt. Engaged with staff in conversation. No noted distress. Will continue to monitor for safety

## 2022-03-09 NOTE — Discharge Instructions (Addendum)
Discharge patient. [facility] will pick him up at 10:30am for Residential Treatment

## 2022-03-09 NOTE — Care Management (Signed)
OBS Care Management   Writer spoke to the admissions intake worker at [facility]) and they were able o verify the patient's insurance.   Writer faxed an intake packet to [phone] and will await their decision to take the patient.   Writer informed the NP [place] and the RN working with the patient

## 2022-03-09 NOTE — Care Management (Signed)
OBS Care Management   Writer spoke to the admission intake worker at [facility] and was required to refax an assessment to their office [phone])  Patient has been tenatively accepted pending a phone intake assessment tomorrow at 8am.  The patient will need to call [facility] at [phone].    Patient has been tenatively accepted and is able to come to their facility tomorrow 03-10-2022 at [place] has informed the NP, [facility]

## 2022-03-09 NOTE — ED Notes (Signed)
Patient alert and oriented x 3. Denies SI/HI/AVH. Denies intent or plan to harm self or others. Routine conducted according to faculty protocol. Encourage patient to notify staff with any needs or concerns. Patient verbalized agreement and understanding. Will continue to monitor for safety. 

## 2022-03-09 NOTE — ED Provider Notes (Cosign Needed Addendum)
[facility] Continuous Assessment Admission H&P  Date: 03/09/22 Patient Name: Danny West MRN: [id number] Chief Complaint: alcohol detox  Diagnoses:  Final diagnoses:  Alcohol-induced mood disorder ([place])  Alcohol use disorder, severe, dependence ([place])  MDD (major depressive disorder), recurrent severe, without psychosis ([place])    History of Present illness: Danny West is a 47 y.o. adult patient presented to [facility] as a walk requesting assistance with alcohol detox  Danny West, 47 y.o., adult patient seen face to face by this provider, consulted with Dr. Hampton Abbot; and chart reviewed on 03/09/22.  On evaluation [name] reports he has a history of reoccurring depressive disorder severe and alcohol abuse.  Patient reports for the last couple weeks he has been on a alcohol bender drinking 12-14 beers a day.  Reports his last alcohol intake was today when 16 ounce beer with 18% alcohol.  Patient denies any other illicit drug use.  Patient reports that he has been given 30 days by his job to get his mental health and his drinking under control.  Patient reports he works for [employer].  Patient reports he has outpatient psychiatric services with [facility] where he receives counseling.  Patient reports that his counselor is his primary supporter.  Patient denies suicidal/self-harm/homicidal ideations, psychosis, and paranoia.  Patient also reports that he is taking Zoloft 100 mg daily and trazodone but unable to give the doses of the trazodone.  Patient denies a history of seizures with alcohol withdrawal.  Patient reports he lives alone.  Patient reports his primary goal today is to get into a detox and rehab facility. During evaluation Danny West is sitting upright in chair with no noted distress.  He is alert/oriented x 4, calm, cooperative, and attentive.  His responses were appropriate to assessment questions.  His mood is dysphoric with congruent affect.  He spoke in  a clear tone at moderate volume, and normal pace, with good eye contact.   He  denies suicidal/self-harm/homicidal ideation, psychosis, and paranoia.  Objectively:  there is no evidence of psychosis/mania or delusional thinking.  He conversed coherently, with goal directed thoughts, and no distractibility, or pre-occupation and he has denied suicidal/self-harm/homicidal ideation, psychosis, and paranoia.  Patient admitted to continuous assessment unit while awaiting appropriate placement for alcohol detox and rehab.   Total Time spent with patient: 30 minutes  Musculoskeletal  Strength & Muscle Tone: within normal limits Gait & Station: normal Patient leans: N/A  Psychiatric Specialty Exam  Presentation General Appearance:  Appropriate for Environment  Eye Contact: Good  Speech: Clear and Coherent; Normal Rate  Speech Volume: Normal  Handedness: Right   Mood and Affect  Mood: Dysphoric  Affect: Congruent   Thought Process  Thought Processes: Coherent; Goal Directed  Descriptions of Associations:Intact  Orientation:Full (Time, Place and Person)  Thought Content:Logical  Diagnosis of Schizophrenia or Schizoaffective disorder in past: No   Hallucinations:Hallucinations: None  Ideas of Reference:None  Suicidal Thoughts:Suicidal Thoughts: No  Homicidal Thoughts:Homicidal Thoughts: No   Sensorium  Memory: Immediate Good; Recent Good; Remote Good  Judgment: Intact  Insight: Present   Executive Functions  Concentration: Good  Attention Span: Good  Recall: Good  Fund of Knowledge: Good  Language: Good   Psychomotor Activity  Psychomotor Activity: Psychomotor Activity: Normal   Assets  Assets: Communication Skills; Desire for Improvement; Financial Resources/Insurance; Housing; Resilience; Social Support; Transportation   Sleep  Sleep: Sleep: Fair   Nutritional Assessment (For OBS and FBC admissions only) Has the  patient had a  weight loss or gain of 10 pounds or more in the last 3 months?: No Has the patient had a decrease in food intake/or appetite?: No Does the patient have dental problems?: No Does the patient have eating habits or behaviors that may be indicators of an eating disorder including binging or inducing vomiting?: No Has the patient recently lost weight without trying?: 0 Has the patient been eating poorly because of a decreased appetite?: 0 Malnutrition Screening Tool Score: 0    Physical Exam Vitals and nursing note reviewed. Exam conducted with a chaperone present.  Constitutional:      General: Danny West is not in acute distress.    Appearance: Normal appearance. [name] is not ill-appearing.  HENT:     Head: Normocephalic.  Eyes:     Conjunctiva/sclera: Conjunctivae normal.  Cardiovascular:     Rate and Rhythm: Normal rate.  Pulmonary:     Effort: Pulmonary effort is normal. No respiratory distress.  Musculoskeletal:        General: Normal range of motion.     Cervical back: Normal range of motion.  Skin:    General: Skin is warm and dry.  Neurological:     Mental Status: Danny West is alert and oriented to person, place, and time.  Psychiatric:        Attention and Perception: Attention and perception normal. Danny West does not perceive auditory or visual hallucinations.        Mood and Affect: Affect normal. Depressed: dysphoric.        Speech: Speech normal.        Behavior: Behavior normal. Behavior is cooperative.        Thought Content: Thought content normal. Thought content is not paranoid or delusional. Thought content does not include homicidal or suicidal ideation.        Cognition and Memory: Cognition normal.        Judgment: Judgment normal.    Review of Systems  Psychiatric/Behavioral:  Depression: Stable. Hallucinations: Denies. Substance abuse: Alcohol. Suicidal ideas: Denies. The patient has insomnia. Nervous/anxious: Stable.  All other systems  reviewed and are negative.   Blood pressure (!) 156/100, pulse 79, temperature 97.9 F (36.6 C), temperature source Oral, resp. rate 18, SpO2 100 %. There is no height or weight on file to calculate BMI.  Past Psychiatric History: MDD, alcohol abuse, prior suicide attempt  Is the patient at risk to self? No  Has the patient been a risk to self in the past 6 months? No .    Has the patient been a risk to self within the distant past? No   Is the patient a risk to others? No   Has the patient been a risk to others in the past 6 months? No   Has the patient been a risk to others within the distant past? No   Past Medical History:  Past Medical History:  Diagnosis Date   Alcohol use disorder, severe, dependence ([place]) 09/13/2021   Asthma    H/O suicide attempt 09/14/2021   June 2023 - "took handful of zoloft and naltrexone while home alone", wrote on whiteboard at home, "if you're alive tomorrow, you will live for your kids". Has not mentioned this to anyone Geneticist, molecular included), no psych hospitalization     Family History: None reported  Social History:  Social History   Socioeconomic History   Marital status: Divorced    Spouse name: Not on file   Number of  children: 2   Years of education: 12   Highest education level: Not on file  Occupational History   Not on file  Tobacco Use   Smoking status: Never   Smokeless tobacco: Current    Types: Snuff  Vaping Use   Vaping Use: Never used  Substance and Sexual Activity   Alcohol use: Yes    Comment: drinks (4) 16 oz beers daily   Drug use: Never   Sexual activity: Not Currently  Other Topics Concern   Not on file  Social History Narrative   Pt lives alone and works at [employer]. Divorced 11/2019; twin boys are 49 years old   Social Determinants of Health   Financial Resource Strain: Not on file  Food Insecurity: Not on file  Transportation Needs: Not on file  Physical Activity: Not on file  Stress: Not on file  Social  Connections: Not on file  Intimate Partner Violence: Not on file     Last Labs:  Office Visit on 01/22/2022  Component Date Value Ref Range Status   TSH 01/22/2022 36.100 (H)  0.450 - 4.500 uIU/mL Final  Office Visit on 09/21/2021  Component Date Value Ref Range Status   Cholesterol, Total 09/21/2021 160  100 - 199 mg/dL Final   Triglycerides 09/21/2021 326 (H)  0 - 149 mg/dL Final   HDL 09/21/2021 46  >39 mg/dL Final   VLDL Cholesterol Cal 09/21/2021 51 (H)  5 - 40 mg/dL Final   LDL Chol Calc (NIH) 09/21/2021 63  0 - 99 mg/dL Final   Chol/HDL Ratio 09/21/2021 3.5  0.0 - 5.0 ratio Final   Comment:                                   T. Chol/HDL Ratio                                             Men  Women                               1/2 Avg.Risk  3.4    3.3                                   Avg.Risk  5.0    4.4                                2X Avg.Risk  9.6    7.1                                3X Avg.Risk 23.4   11.0    TSH 09/21/2021 96.200 (H)  0.450 - 4.500 uIU/mL Final   Total Protein 09/21/2021 6.2  6.0 - 8.5 g/dL Final   Albumin 09/21/2021 4.2  4.1 - 5.1 g/dL Final   Bilirubin Total 09/21/2021 0.4  0.0 - 1.2 mg/dL Final   Bilirubin, Direct 09/21/2021 0.25  0.00 - 0.40 mg/dL Final   Alkaline Phosphatase 09/21/2021 105  44 - 121 IU/L Final   AST 09/21/2021 21  0 -  40 IU/L Final   ALT 09/21/2021 19  0 - 44 IU/L Final  Admission on 09/13/2021, Discharged on 09/17/2021  Component Date Value Ref Range Status   SARS Coronavirus 2 by RT PCR 09/13/2021 NEGATIVE  NEGATIVE Final   Comment: (NOTE) SARS-CoV-2 target nucleic acids are NOT DETECTED.  The SARS-CoV-2 RNA is generally detectable in upper respiratory specimens during the acute phase of infection. The lowest concentration of SARS-CoV-2 viral copies this assay can detect is 138 copies/mL. A negative result does not preclude SARS-Cov-2 infection and should not be used as the sole basis for treatment or other patient  management decisions. A negative result may occur with  improper specimen collection/handling, submission of specimen other than nasopharyngeal swab, presence of viral mutation(s) within the areas targeted by this assay, and inadequate number of viral copies(<138 copies/mL). A negative result must be combined with clinical observations, patient history, and epidemiological information. The expected result is Negative.  Fact Sheet for Patients:  EntrepreneurPulse.com.au  Fact Sheet for Healthcare Providers:  IncredibleEmployment.be  This test is no                          t yet approved or cleared by the [place] FDA and  has been authorized for detection and/or diagnosis of SARS-CoV-2 by FDA under an Emergency Use Authorization (EUA). This EUA will remain  in effect (meaning this test can be used) for the duration of the COVID-19 declaration under Section 564(b)(1) of the Act, 21 U.S.C.section 360bbb-3(b)(1), unless the authorization is terminated  or revoked sooner.       Influenza A by PCR 09/13/2021 NEGATIVE  NEGATIVE Final   Influenza B by PCR 09/13/2021 NEGATIVE  NEGATIVE Final   Comment: (NOTE) The Xpert Xpress SARS-CoV-2/FLU/RSV plus assay is intended as an aid in the diagnosis of influenza from Nasopharyngeal swab specimens and should not be used as a sole basis for treatment. Nasal washings and aspirates are unacceptable for Xpert Xpress SARS-CoV-2/FLU/RSV testing.  Fact Sheet for Patients: EntrepreneurPulse.com.au  Fact Sheet for Healthcare Providers: IncredibleEmployment.be  This test is not yet approved or cleared by the [place] FDA and has been authorized for detection and/or diagnosis of SARS-CoV-2 by FDA under an Emergency Use Authorization (EUA). This EUA will remain in effect (meaning this test can be used) for the duration of the COVID-19 declaration under Section 564(b)(1)  of the Act, 21 U.S.C. section 360bbb-3(b)(1), unless the authorization is terminated or revoked.  Performed at [facility], [place] [address] [place], [place] [address]    Neisseria Gonorrhea 09/13/2021 Negative   Final   Chlamydia 09/13/2021 Negative   Final   Comment 09/13/2021 Normal Reference [place] Chlamydia - Negative   Final   Comment 09/13/2021 Normal Reference Range Neisseria Gonorrhea - Negative   Final   RPR Ser Ql 09/13/2021 NON REACTIVE  NON REACTIVE Final   Performed at [facility], [place] [address], [place], [place] [address]   TSH 09/13/2021 44.466 (H)  0.350 - 4.500 uIU/mL Final   Comment: Performed by a 3rd Generation assay with a functional sensitivity of <=0.01 uIU/mL. Performed at [facility], [place] [address], [place], [place] [address]    Cholesterol 09/13/2021 226 (H)  0 - 200 mg/dL Final   Triglycerides 09/13/2021 718 (H)  <150 mg/dL Final   HDL 09/13/2021 53  >40 mg/dL Final   Total CHOL/HDL Ratio 09/13/2021 4.3  RATIO Final   VLDL  09/13/2021 UNABLE TO CALCULATE IF TRIGLYCERIDE OVER 400 mg/dL  0 - 40 mg/dL Final   LDL Cholesterol 09/13/2021 UNABLE TO CALCULATE IF TRIGLYCERIDE OVER 400 mg/dL  0 - 99 mg/dL Final   Comment:        Total Cholesterol/HDL:CHD Risk Coronary Heart Disease Risk Table                     Men   Women  1/2 Average Risk   3.4   3.3  Average Risk       5.0   4.4  2 X Average Risk   9.6   7.1  3 X Average Risk  23.4   11.0        Use the calculated Patient Ratio above and the CHD Risk Table to determine the patient's CHD Risk.        ATP III CLASSIFICATION (LDL):  <100     mg/dL   Optimal  100-129  mg/dL   Near or Above                    Optimal  130-159  mg/dL   Borderline  160-189  mg/dL   High  >190     mg/dL   Very High Performed at [facility] [address], [place], [place] [address]    Alcohol, Ethyl (B) 09/13/2021 128 (H)  <10 mg/dL Final   Comment: (NOTE) Lowest detectable limit for serum  alcohol is 10 mg/dL.  For medical purposes only. Performed at [facility], [place] [address], [place], [place] [address]    Magnesium 09/13/2021 2.2  1.7 - 2.4 mg/dL Final   Performed at [facility] [address] [place], [place] [address]   Hgb A1c MFr Bld 09/13/2021 5.2  4.8 - 5.6 % Final   Comment: (NOTE) Pre diabetes:          5.7%-6.4%  Diabetes:              >6.4%  Glycemic control for   <7.0% adults with diabetes    Mean Plasma Glucose 09/13/2021 102.54  mg/dL Final   Performed at [facility], [place] [address], [place], [place] [address]   Sodium 09/13/2021 138  135 - 145 mmol/L Final   Potassium 09/13/2021 3.9  3.5 - 5.1 mmol/L Final   Chloride 09/13/2021 103  98 - 111 mmol/L Final   CO2 09/13/2021 24  22 - 32 mmol/L Final   Glucose, Bld 09/13/2021 74  70 - 99 mg/dL Final   Glucose reference range applies only to samples taken after fasting for at least 8 hours.   BUN 09/13/2021 8  6 - 20 mg/dL Final   Creatinine, Ser 09/13/2021 0.64  0.61 - 1.24 mg/dL Final   Calcium 09/13/2021 9.0  8.9 - 10.3 mg/dL Final   Total Protein 09/13/2021 7.1  6.5 - 8.1 g/dL Final   Albumin 09/13/2021 4.0  3.5 - 5.0 g/dL Final   AST 09/13/2021 55 (H)  15 - 41 U/L Final   ALT 09/13/2021 27  0 - 44 U/L Final   Alkaline Phosphatase 09/13/2021 103  38 - 126 U/L Final   Total Bilirubin 09/13/2021 1.2  0.3 - 1.2 mg/dL Final   GFR, Estimated 09/13/2021 >60  >60 mL/min Final   Comment: (NOTE) Calculated using the CKD-EPI Creatinine Equation (2021)    Anion gap 09/13/2021 11  5 - 15 Final   Performed at  [facility], [place] [address], [place], [place] [address]   WBC 09/13/2021 5.1  4.0 - 10.5 K/uL Final   RBC 09/13/2021 4.63  4.22 - 5.81 MIL/uL Final   Hemoglobin 09/13/2021 15.2  13.0 - 17.0 g/dL Final   HCT 09/13/2021 42.4  39.0 - 52.0 % Final   MCV 09/13/2021 91.6  80.0 - 100.0 fL Final   MCH 09/13/2021 32.8  26.0 - 34.0 pg Final   MCHC 09/13/2021 35.8  30.0 - 36.0  g/dL Final   RDW 09/13/2021 12.4  11.5 - 15.5 % Final   Platelets 09/13/2021 167  150 - 400 K/uL Final   nRBC 09/13/2021 0.0  0.0 - 0.2 % Final   Neutrophils Relative % 09/13/2021 57  % Final   Neutro Abs 09/13/2021 2.9  1.7 - 7.7 K/uL Final   Lymphocytes Relative 09/13/2021 31  % Final   Lymphs Abs 09/13/2021 1.6  0.7 - 4.0 K/uL Final   Monocytes Relative 09/13/2021 8  % Final   Monocytes Absolute 09/13/2021 0.4  0.1 - 1.0 K/uL Final   Eosinophils Relative 09/13/2021 2  % Final   Eosinophils Absolute 09/13/2021 0.1  0.0 - 0.5 K/uL Final   Basophils Relative 09/13/2021 2  % Final   Basophils Absolute 09/13/2021 0.1  0.0 - 0.1 K/uL Final   Immature Granulocytes 09/13/2021 0  % Final   Abs Immature Granulocytes 09/13/2021 0.01  0.00 - 0.07 K/uL Final   Performed at [facility], [place] [address], [place], [place] [address]   POC Amphetamine UR 09/13/2021 None Detected  NONE DETECTED (Cut Off Level 1000 ng/mL) Final   POC Secobarbital (BAR) 09/13/2021 None Detected  NONE DETECTED (Cut Off Level 300 ng/mL) Final   POC Buprenorphine (BUP) 09/13/2021 None Detected  NONE DETECTED (Cut Off Level 10 ng/mL) Final   POC Oxazepam (BZO) 09/13/2021 None Detected  NONE DETECTED (Cut Off Level 300 ng/mL) Final   POC Cocaine UR 09/13/2021 None Detected  NONE DETECTED (Cut Off Level 300 ng/mL) Final   POC Methamphetamine UR 09/13/2021 None Detected  NONE DETECTED (Cut Off Level 1000 ng/mL) Final   POC Morphine 09/13/2021 None Detected  NONE DETECTED (Cut Off Level 300 ng/mL) Final   POC Methadone UR 09/13/2021 None Detected  NONE DETECTED (Cut Off Level 300 ng/mL) Final   POC Oxycodone UR 09/13/2021 None Detected  NONE DETECTED (Cut Off Level 100 ng/mL) Final   POC Marijuana UR 09/13/2021 None Detected  NONE DETECTED (Cut Off Level 50 ng/mL) Final   SARSCOV2ONAVIRUS 2 AG 09/13/2021 NEGATIVE  NEGATIVE Final   Comment: (NOTE) SARS-CoV-2 antigen NOT DETECTED.   Negative results are presumptive.   Negative results do not preclude SARS-CoV-2 infection and should not be used as the sole basis for treatment or other patient management decisions, including infection  control decisions, particularly in the presence of clinical signs and  symptoms consistent with COVID-19, or in those who have been in contact with the virus.  Negative results must be combined with clinical observations, patient history, and epidemiological information. The expected result is Negative.  Fact Sheet for Patients: HandmadeRecipes.com.cy  Fact Sheet for Healthcare Providers: FuneralLife.at  This test is not yet approved or cleared by the [place] FDA and  has been authorized for detection and/or diagnosis of SARS-CoV-2 by FDA under an Emergency Use Authorization (EUA).  This EUA will remain in effect (meaning this test can be used) for the duration of  the COV  ID-19 declaration under Section 564(b)(1) of the Act, 21 U.S.C. section 360bbb-3(b)(1), unless the authorization is terminated or revoked sooner.     Direct LDL 09/13/2021 71  0 - 99 mg/dL Final   Performed at [facility] [address], [place], [place] [address]    Allergies: Patient has no known allergies.  Medications:  Facility Ordered Medications  Medication   acetaminophen (TYLENOL) tablet 650 mg   alum & mag hydroxide-simeth (MAALOX/MYLANTA) 200-200-20 MG/5ML suspension 30 mL   magnesium hydroxide (MILK OF MAGNESIA) suspension 30 mL   [START ON 03/10/2022] levothyroxine (SYNTHROID) tablet 200 mcg   sertraline (ZOLOFT) tablet 100 mg   traZODone (DESYREL) tablet 25 mg   PTA Medications  Medication Sig   sertraline (ZOLOFT) 100 MG tablet Take 1.5 tablets (150 mg total) by mouth daily.   traZODone (DESYREL) 50 MG tablet Take 0.5 tablets (25 mg total) by mouth at bedtime as needed for sleep (Can take additional half tablet after 44mns if still unable to  sleep. Can also break into quarters if half tablet ('25mg'$ ) makes you too sleepy).   levothyroxine (SYNTHROID) 200 MCG tablet Take 1 tablet (200 mcg total) by mouth daily before breakfast.   thiamine (VITAMIN B-1) 100 MG tablet Take 1 tablet (100 mg total) by mouth daily.   naltrexone (DEPADE) 50 MG tablet Take 25 mg by mouth daily.    Medical Decision Making  [facility] ([facility], crisis management, and stabilization. Routine labs ordered, which include Lab Orders         Resp panel by RT-PCR (RSV, Flu A&B, Covid) Anterior Nasal Swab         CBC with Differential/Platelet         Comprehensive metabolic panel         Hemoglobin A1c         Magnesium         Ethanol         Lipid panel         TSH         Prolactin         Urinalysis, Routine w reflex microscopic -Urine, Catheterized         POCT Urine Drug Screen - (I-Screen)     Medication Management: Medications started Meds ordered this encounter  Medications   acetaminophen (TYLENOL) tablet 650 mg   alum & mag hydroxide-simeth (MAALOX/MYLANTA) 200-200-20 MG/5ML suspension 30 mL   magnesium hydroxide (MILK OF MAGNESIA) suspension 30 mL   levothyroxine (SYNTHROID) tablet 200 mcg   sertraline (ZOLOFT) tablet 100 mg   traZODone (DESYREL) tablet 25 mg    Will maintain continuous observation for safety. Social work will assist with referral to detox and rehab facility.      Recommendations  Based on my evaluation the patient does not appear to have an emergency medical condition.  Sharief Wainwright, NP 03/09/22  2:58 PM

## 2022-03-09 NOTE — ED Notes (Signed)
Pt  admitted to  obs denies SI/HI/AVH. Calm, cooperative throughout interview process. Skin assessment completed. Oriented to unit. Meal and drink offered. At [place], pt continue to deny SI/HI/AVH. Pt verbally contract for safety. Will monitor for safety.

## 2022-03-10 DIAGNOSIS — F1094 Alcohol use, unspecified with alcohol-induced mood disorder: Secondary | ICD-10-CM | POA: Diagnosis not present

## 2022-03-10 DIAGNOSIS — F332 Major depressive disorder, recurrent severe without psychotic features: Secondary | ICD-10-CM

## 2022-03-10 LAB — HEMOGLOBIN A1C
Hgb A1c MFr Bld: 5.1 % (ref 4.8–5.6)
Mean Plasma Glucose: 100 mg/dL

## 2022-03-10 MED ORDER — HYDROXYZINE HCL 25 MG PO TABS: 25.0000 mg | ORAL_TABLET | Freq: Three times a day (TID) | ORAL | Status: DC | PRN

## 2022-03-10 MED ORDER — SERTRALINE HCL 100 MG PO TABS: 100.0000 mg | ORAL_TABLET | Freq: Every day | ORAL | Status: DC

## 2022-03-10 NOTE — Care Management (Signed)
OBS Care Management   Writer arranged for the patient to complete his phone assessment with [facility].  Per  Merrily Pew at [facility] that the patient is accepted to their program and they will pick up the patient at 10:30am.    Writer informed the NP and the RN working with the patient.

## 2022-03-10 NOTE — Progress Notes (Signed)
Order received for patients discharge. Patient completed intake with [facility]. Patient verbalized understanding of follow up care. AVS given. Patient belongings returned. Pt denies any SI or HI or AV Hallucinations. Pt escorted to [facility]

## 2022-03-10 NOTE — ED Notes (Signed)
Pt sleeping in recliner bed. RR even and unlabored. No noted distress. Will continue to monitor for safety

## 2022-03-10 NOTE — ED Notes (Addendum)
Note entered in Error.

## 2022-03-10 NOTE — ED Notes (Signed)
Pt sleeping at present, no distress noted.  Monitoring for safety. 

## 2022-03-10 NOTE — ED Provider Notes (Signed)
FBC/OBS ASAP Discharge Summary  Date and Time: 03/10/2022 8:40 AM  Name: Danny West  MRN:  [id number]   Discharge Diagnoses:  Final diagnoses:  Alcohol-induced mood disorder ([place])  Alcohol use disorder, severe, dependence ([place])  MDD (major depressive disorder), recurrent severe, without psychosis ([place])   History of Present illness: Danny West is a 47 y.o. adult patient presented to [facility] as a walk requesting assistance with alcohol detox.  [facility] was contacted and patient has been accepted to their facility for 03/10/2022.  He was admitted to the continuous assessment unit while awaiting bed availability.   Danny West, 47 y.o., adult patient seen face to face by this provider, consulted with Dr. Hampton Abbot; and chart reviewed on 02/27/2. Patient reports he works for [employer].  Patient reports he has outpatient psychiatric services with [facility] where he receives counseling.   On admission note by Earleen Newport NP on 03/09/2022 "Patient reports for the last couple weeks he has been on a alcohol bender drinking 12-14 beers a day.  Reports his last alcohol intake was today when 16 ounce beer with 18% alcohol.  Patient denies any other illicit drug use.  Patient reports that he has been given 30 days by his job to get his mental health and his drinking under control" .   Subjective:   On today's reassessment patient is observed in the assessment room speaking to staff.  He is in no acute distress.  He is alert/oriented x 4, calm, cooperative, and attentive.  He is pleasant upon approach.  Reports he is excited to start his starting with residential treatment.  He endorses depression but states he feels that it is somewhat stable at this time.  He has a euthymic affect.  He denies any concerns with appetite or sleep.  He denies SI/HI/AVH.  He does not appear to be responding to internal/external stimuli.  He is able to answer questions appropriately.  He is currently  denying any alcohol withdrawal symptoms at this time.  Stay Summary:   Patient was admitted to the continuous assessment unit for alcohol detox.  He has been accepted to [facility].  They will pick patient up at 10:30 AM.  He has remained calm and cooperative while on the unit.  He has been appropriate with staff and other patients.  He required no as needed medications for agitation.  He has been compliant with medications.  Total Time spent with patient: 30 minutes  Past Psychiatric History: As documented in H&P Past Medical History: As documented in H&P Family History: As documented in H&P Family Psychiatric History: As documented in H&P Social History: As documented in H&P Tobacco Cessation: Patient is going to [facility], no prescription needed.  Current Medications:  Current Facility-Administered Medications  Medication Dose Route Frequency Provider Last Rate Last Admin   acetaminophen (TYLENOL) tablet 650 mg  650 mg Oral Q6H PRN Rankin, Shuvon B, NP       alum & mag hydroxide-simeth (MAALOX/MYLANTA) 200-200-20 MG/5ML suspension 30 mL  30 mL Oral Q4H PRN Rankin, Shuvon B, NP       levothyroxine (SYNTHROID) tablet 200 mcg  200 mcg Oral Q0600 Rankin, Shuvon B, NP   200 mcg at 03/10/22 0616   magnesium hydroxide (MILK OF MAGNESIA) suspension 30 mL  30 mL Oral Daily PRN Rankin, Shuvon B, NP       sertraline (ZOLOFT) tablet 100 mg  100 mg Oral Daily Rankin, Shuvon B, NP  traZODone (DESYREL) tablet 25 mg  25 mg Oral QHS PRN Rankin, Shuvon B, NP   25 mg at 03/09/22 2120   Current Outpatient Medications  Medication Sig Dispense Refill   albuterol (VENTOLIN HFA) 108 (90 Base) MCG/ACT inhaler Inhale 2 puffs into the lungs every 6 (six) hours as needed for wheezing or shortness of breath.     levothyroxine (SYNTHROID) 200 MCG tablet Take 1 tablet (200 mcg total) by mouth daily before breakfast. 30 tablet 4   sertraline (ZOLOFT) 100 MG tablet Take 1.5 tablets (150 mg total) by  mouth daily. 45 tablet 0   traZODone (DESYREL) 50 MG tablet Take 0.5 tablets (25 mg total) by mouth at bedtime as needed for sleep (Can take additional half tablet after 62mns if still unable to sleep. Can also break into quarters if half tablet ('25mg'$ ) makes you too sleepy). 30 tablet 0    PTA Medications:  Facility Ordered Medications  Medication   acetaminophen (TYLENOL) tablet 650 mg   alum & mag hydroxide-simeth (MAALOX/MYLANTA) 200-200-20 MG/5ML suspension 30 mL   magnesium hydroxide (MILK OF MAGNESIA) suspension 30 mL   levothyroxine (SYNTHROID) tablet 200 mcg   sertraline (ZOLOFT) tablet 100 mg   traZODone (DESYREL) tablet 25 mg   PTA Medications  Medication Sig   sertraline (ZOLOFT) 100 MG tablet Take 1.5 tablets (150 mg total) by mouth daily.   traZODone (DESYREL) 50 MG tablet Take 0.5 tablets (25 mg total) by mouth at bedtime as needed for sleep (Can take additional half tablet after 345ms if still unable to sleep. Can also break into quarters if half tablet ('25mg'$ ) makes you too sleepy).   levothyroxine (SYNTHROID) 200 MCG tablet Take 1 tablet (200 mcg total) by mouth daily before breakfast.       01/22/2022   10:00 AM 09/19/2021   10:07 AM 09/13/2021    1:15 PM  Depression screen PHQ 2/9  Decreased Interest 0 1 1  Down, Depressed, Hopeless 0 0 2  PHQ - 2 Score 0 1 3  Altered sleeping 0 2 2  Tired, decreased energy 0 2 2  Change in appetite 0 1 3  Feeling bad or failure about yourself  1 0 2  Trouble concentrating 0 0 0  Moving slowly or fidgety/restless 0 0 0  Suicidal thoughts 0 0 0  PHQ-9 Score '1 6 12  '$ Difficult doing work/chores  Somewhat difficult Somewhat difficult    [facility] No Risk       Musculoskeletal  Strength &  Muscle Tone: within normal limits Gait & Station: normal Patient leans: N/A  Psychiatric Specialty Exam  Presentation  General Appearance:  Appropriate for Environment  Eye Contact: Good  Speech: Clear and Coherent; Normal Rate  Speech Volume: Normal  Handedness: Right   Mood and Affect  Mood: Euthymic  Affect: Congruent   Thought Process  Thought Processes: Coherent  Descriptions of Associations:Intact  Orientation:Full (Time, Place and Person)  Thought Content:Logical  Diagnosis of Schizophrenia or Schizoaffective disorder in past: No    Hallucinations:Hallucinations: None  Ideas of Reference:None  Suicidal Thoughts:Suicidal Thoughts: No  Homicidal Thoughts:Homicidal Thoughts: No   Sensorium  Memory: Immediate Good; Recent Good; Remote Good  Judgment: Intact  Insight: Good   Executive Functions  Concentration: Good  Attention Span:  Good  Recall: Good  Fund of Knowledge: Good  Language: Good   Psychomotor Activity  Psychomotor Activity: Psychomotor Activity: Normal   Assets  Assets: Physical Health; Resilience; Social Support; Armed forces logistics/support/administrative officer; Desire for Improvement; Vocational/Educational   Sleep  Sleep: Sleep: Fair   Nutritional Assessment (For OBS and FBC admissions only) Has the patient had a weight loss or gain of 10 pounds or more in the last 3 months?: No Has the patient had a decrease in food intake/or appetite?: No Does the patient have dental problems?: No Does the patient have eating habits or behaviors that may be indicators of an eating disorder including binging or inducing vomiting?: No Has the patient recently lost weight without trying?: 0 Has the patient been eating poorly because of a decreased appetite?: 0 Malnutrition Screening Tool Score: 0    Physical Exam  Physical Exam Vitals and nursing note reviewed.  Constitutional:      General: Danny West is not in acute distress.     Appearance: Normal appearance. Danny West is not ill-appearing.  HENT:     Head: Normocephalic.  Eyes:     General:        Right eye: No discharge.        Left eye: No discharge.     Conjunctiva/sclera: Conjunctivae normal.     Pupils: Pupils are equal, round, and reactive to light.  Cardiovascular:     Rate and Rhythm: Normal rate.  Pulmonary:     Effort: Pulmonary effort is normal.  Musculoskeletal:        General: Normal range of motion.     Cervical back: Normal range of motion.  Skin:    General: Skin is warm and dry.     Coloration: Skin is not jaundiced or pale.  Neurological:     Mental Status: Danny West is alert and oriented to person, place, and time.  Psychiatric:        Attention and Perception: Attention and perception normal.        Mood and Affect: Affect normal.        Speech: Speech normal.        Behavior: Behavior normal. Behavior is cooperative.        Thought Content: Thought content normal.        Cognition and Memory: Cognition normal.        Judgment: Judgment normal.    Review of Systems  Constitutional: Negative.   HENT: Negative.    Eyes: Negative.   Respiratory: Negative.    Cardiovascular: Negative.   Musculoskeletal: Negative.   Skin: Negative.   Neurological: Negative.   Psychiatric/Behavioral: Negative.     Blood pressure (!) 136/96, pulse 79, temperature 97.9 F (36.6 C), temperature source Oral, resp. rate 19, SpO2 98 %. There is no height or weight on file to calculate BMI.  Demographic Factors:  Male and Caucasian  Loss Factors: NA  Historical Factors: NA  Risk Reduction Factors:   Sense of responsibility to family, Employed, Positive social support, Positive therapeutic relationship, and Positive coping skills or problem solving skills  Continued Clinical Symptoms:  Severe Anxiety and/or Agitation Depression:   Comorbid alcohol abuse/dependence Alcohol/Substance Abuse/Dependencies  Cognitive Features That  Contribute To Risk:  None    Suicide Risk:  Minimal: No identifiable suicidal ideation.  Patients presenting with no risk factors but with morbid ruminations; may be classified as minimal risk based on the severity of the depressive symptoms  Plan Of Care/Follow-up recommendations:  Activity:  as tolerated  Diet:  Regular   Disposition:   Discharge patient.  He has been accepted to [facility] for residential substance abuse treatment..  They will provide transportation and will pick patient up at 10:30 AM.  Medications not prescribed upon discharge. [facility] will provide medications once at facility.   Revonda Humphrey, NP 03/10/2022, 8:40 AM

## 2022-03-10 NOTE — ED Notes (Signed)
Pt reports he feels '' good about going in for treatment, I have depression, and then I have a month off from my job to handle this. And I just need to get myself straight. ''  Pt denies any SI or HI . States he has had 90 days sober and just '' relapsed and I need to do better. '' Allowed pt to ventilate and support given. Pt denies any AH or VH. Currently [facility].  Pt is safe. Will con't to monitor.

## 2022-03-11 LAB — PROLACTIN: Prolactin: 10 ng/mL (ref 3.9–22.7)

## 2022-05-25 ENCOUNTER — Ambulatory Visit: Payer: BC Managed Care – PPO | Admitting: Internal Medicine

## 2022-10-07 ENCOUNTER — Other Ambulatory Visit: Payer: Self-pay | Admitting: Internal Medicine

## 2022-10-07 DIAGNOSIS — E039 Hypothyroidism, unspecified: Secondary | ICD-10-CM

## 2022-10-14 ENCOUNTER — Other Ambulatory Visit (HOSPITAL_COMMUNITY)
Admission: EM | Admit: 2022-10-14 | Discharge: 2022-10-19 | Payer: No Payment, Other | Attending: Psychiatry | Admitting: Psychiatry

## 2022-10-14 ENCOUNTER — Ambulatory Visit (HOSPITAL_COMMUNITY)
Admission: EM | Admit: 2022-10-14 | Discharge: 2022-10-14 | Disposition: A | Discharge status: Other Acute Inpatient Hospital | Payer: No Payment, Other | Attending: Psychiatry | Admitting: Psychiatry

## 2022-10-14 DIAGNOSIS — Z9151 Personal history of suicidal behavior: Secondary | ICD-10-CM | POA: Insufficient documentation

## 2022-10-14 DIAGNOSIS — F1094 Alcohol use, unspecified with alcohol-induced mood disorder: Secondary | ICD-10-CM

## 2022-10-14 DIAGNOSIS — F17200 Nicotine dependence, unspecified, uncomplicated: Secondary | ICD-10-CM | POA: Insufficient documentation

## 2022-10-14 DIAGNOSIS — F10230 Alcohol dependence with withdrawal, uncomplicated: Secondary | ICD-10-CM | POA: Insufficient documentation

## 2022-10-14 DIAGNOSIS — F1010 Alcohol abuse, uncomplicated: Secondary | ICD-10-CM | POA: Diagnosis present

## 2022-10-14 DIAGNOSIS — F331 Major depressive disorder, recurrent, moderate: Secondary | ICD-10-CM | POA: Insufficient documentation

## 2022-10-14 DIAGNOSIS — F411 Generalized anxiety disorder: Secondary | ICD-10-CM | POA: Insufficient documentation

## 2022-10-14 DIAGNOSIS — E785 Hyperlipidemia, unspecified: Secondary | ICD-10-CM | POA: Insufficient documentation

## 2022-10-14 DIAGNOSIS — F10930 Alcohol use, unspecified with withdrawal, uncomplicated: Secondary | ICD-10-CM

## 2022-10-14 DIAGNOSIS — E039 Hypothyroidism, unspecified: Secondary | ICD-10-CM | POA: Diagnosis not present

## 2022-10-14 DIAGNOSIS — D539 Nutritional anemia, unspecified: Secondary | ICD-10-CM | POA: Diagnosis not present

## 2022-10-14 DIAGNOSIS — F1020 Alcohol dependence, uncomplicated: Secondary | ICD-10-CM

## 2022-10-14 DIAGNOSIS — Z79899 Other long term (current) drug therapy: Secondary | ICD-10-CM | POA: Insufficient documentation

## 2022-10-14 DIAGNOSIS — F32A Depression, unspecified: Secondary | ICD-10-CM | POA: Insufficient documentation

## 2022-10-14 DIAGNOSIS — Z79890 Hormone replacement therapy (postmenopausal): Secondary | ICD-10-CM | POA: Insufficient documentation

## 2022-10-14 DIAGNOSIS — R7989 Other specified abnormal findings of blood chemistry: Secondary | ICD-10-CM

## 2022-10-14 LAB — LIPID PANEL
Cholesterol: 275 mg/dL — ABNORMAL HIGH (ref 0–200)
HDL: 51 mg/dL (ref >40)
LDL Cholesterol: 173 mg/dL — ABNORMAL HIGH (ref 0–99)
Total CHOL/HDL Ratio: 5.4 {ratio}
Triglycerides: 255 mg/dL — ABNORMAL HIGH (ref <150)
VLDL: 51 mg/dL — ABNORMAL HIGH (ref 0–40)

## 2022-10-14 LAB — COMPREHENSIVE METABOLIC PANEL
ALT: 36 U/L (ref 0–44)
AST: 99 U/L — ABNORMAL HIGH (ref 15–41)
Albumin: 3.4 g/dL — ABNORMAL LOW (ref 3.5–5.0)
Alkaline Phosphatase: 236 U/L — ABNORMAL HIGH (ref 38–126)
Anion gap: 10 (ref 5–15)
BUN: 8 mg/dL (ref 6–20)
CO2: 26 mmol/L (ref 22–32)
Calcium: 9.2 mg/dL (ref 8.9–10.3)
Chloride: 103 mmol/L (ref 98–111)
Creatinine, Ser: 0.77 mg/dL (ref 0.61–1.24)
GFR, Estimated: >60 mL/min (ref >60)
Glucose, Bld: 104 mg/dL — ABNORMAL HIGH (ref 70–99)
Potassium: 3.9 mmol/L (ref 3.5–5.1)
Sodium: 139 mmol/L (ref 135–145)
Total Bilirubin: 2.5 mg/dL — ABNORMAL HIGH (ref 0.3–1.2)
Total Protein: 7.8 g/dL (ref 6.5–8.1)

## 2022-10-14 LAB — CBC WITH DIFFERENTIAL/PLATELET
Abs Immature Granulocytes: 0.02 10*3/uL (ref 0.00–0.07)
Basophils Absolute: 0.1 10*3/uL (ref 0.0–0.1)
Basophils Relative: 1 %
Eosinophils Absolute: 0.1 10*3/uL (ref 0.0–0.5)
Eosinophils Relative: 1 %
HCT: 32.5 % — ABNORMAL LOW (ref 39.0–52.0)
Hemoglobin: 11.1 g/dL — ABNORMAL LOW (ref 13.0–17.0)
Immature Granulocytes: 0 %
Lymphocytes Relative: 22 %
Lymphs Abs: 1.1 10*3/uL (ref 0.7–4.0)
MCH: 35.8 pg — ABNORMAL HIGH (ref 26.0–34.0)
MCHC: 34.2 g/dL (ref 30.0–36.0)
MCV: 104.8 fL — ABNORMAL HIGH (ref 80.0–100.0)
Monocytes Absolute: 0.4 10*3/uL (ref 0.1–1.0)
Monocytes Relative: 8 %
Neutro Abs: 3.3 10*3/uL (ref 1.7–7.7)
Neutrophils Relative %: 68 %
Platelets: 91 10*3/uL — ABNORMAL LOW (ref 150–400)
RBC: 3.1 MIL/uL — ABNORMAL LOW (ref 4.22–5.81)
RDW: 16.3 % — ABNORMAL HIGH (ref 11.5–15.5)
WBC: 4.9 10*3/uL (ref 4.0–10.5)
nRBC: 0 % (ref 0.0–0.2)

## 2022-10-14 LAB — URINALYSIS, ROUTINE W REFLEX MICROSCOPIC
Bilirubin Urine: NEGATIVE
Glucose, UA: NEGATIVE mg/dL
Hgb urine dipstick: NEGATIVE
Ketones, ur: NEGATIVE mg/dL
Leukocytes,Ua: NEGATIVE
Nitrite: NEGATIVE
Protein, ur: NEGATIVE mg/dL
Specific Gravity, Urine: 1.017 (ref 1.005–1.030)
pH: 6 (ref 5.0–8.0)

## 2022-10-14 LAB — T4, FREE: Free T4: 0.38 ng/dL — ABNORMAL LOW (ref 0.61–1.12)

## 2022-10-14 LAB — POCT URINE DRUG SCREEN - MANUAL ENTRY (I-SCREEN)
POC Amphetamine UR: NOT DETECTED
POC Buprenorphine (BUP): NOT DETECTED
POC Cocaine UR: NOT DETECTED
POC Marijuana UR: NOT DETECTED
POC Methadone UR: NOT DETECTED
POC Methamphetamine UR: NOT DETECTED
POC Morphine: NOT DETECTED
POC Oxazepam (BZO): POSITIVE — AB
POC Oxycodone UR: NOT DETECTED
POC Secobarbital (BAR): NOT DETECTED

## 2022-10-14 LAB — ETHANOL: Alcohol, Ethyl (B): <10 mg/dL (ref <10)

## 2022-10-14 LAB — TSH: TSH: 155.02 u[IU]/mL — ABNORMAL HIGH (ref 0.350–4.500)

## 2022-10-14 LAB — HEMOGLOBIN A1C
Hgb A1c MFr Bld: 4.1 % — ABNORMAL LOW (ref 4.8–5.6)
Mean Plasma Glucose: 70.97 mg/dL

## 2022-10-14 LAB — MAGNESIUM: Magnesium: 2 mg/dL (ref 1.7–2.4)

## 2022-10-14 MED ORDER — LORAZEPAM 1 MG PO TABS: 1.0000 mg | ORAL_TABLET | Freq: Every day | ORAL | Status: DC

## 2022-10-14 MED ORDER — LORAZEPAM 1 MG PO TABS: 1.0000 mg | ORAL_TABLET | Freq: Four times a day (QID) | ORAL | Status: AC | PRN

## 2022-10-14 MED ORDER — THIAMINE HCL 100 MG/ML IJ SOLN: 100.0000 mg | Freq: Once | INTRAMUSCULAR | Status: DC

## 2022-10-14 MED ORDER — THIAMINE HCL 100 MG/ML IJ SOLN
100.0000 mg | Freq: Once | INTRAMUSCULAR | Status: AC
  Administered 2022-10-14: 100 mg via INTRAMUSCULAR
  Filled 2022-10-14: qty 2

## 2022-10-14 MED ORDER — LORAZEPAM 1 MG PO TABS
1.0000 mg | ORAL_TABLET | Freq: Four times a day (QID) | ORAL | Status: AC
  Administered 2022-10-14 – 2022-10-15 (×4): 1 mg via ORAL
  Filled 2022-10-14 (×3): qty 1

## 2022-10-14 MED ORDER — ADULT MULTIVITAMIN W/MINERALS CH
1.0000 | ORAL_TABLET | Freq: Every day | ORAL | Status: DC
  Administered 2022-10-14: 1 via ORAL
  Filled 2022-10-14: qty 1

## 2022-10-14 MED ORDER — THIAMINE MONONITRATE 100 MG PO TABS: 100.0000 mg | ORAL_TABLET | Freq: Every day | ORAL | Status: DC

## 2022-10-14 MED ORDER — ACETAMINOPHEN 325 MG PO TABS: 650.0000 mg | ORAL_TABLET | Freq: Four times a day (QID) | ORAL | Status: DC | PRN

## 2022-10-14 MED ORDER — HYDROXYZINE HCL 25 MG PO TABS
25.0000 mg | ORAL_TABLET | Freq: Four times a day (QID) | ORAL | Status: AC | PRN
  Filled 2022-10-14: qty 1

## 2022-10-14 MED ORDER — THIAMINE MONONITRATE 100 MG PO TABS
100.0000 mg | ORAL_TABLET | Freq: Every day | ORAL | Status: DC
  Administered 2022-10-15 – 2022-10-19 (×5): 100 mg via ORAL
  Filled 2022-10-14 (×5): qty 1

## 2022-10-14 MED ORDER — SERTRALINE HCL 100 MG PO TABS
100.0000 mg | ORAL_TABLET | Freq: Every day | ORAL | Status: DC
  Administered 2022-10-14 – 2022-10-19 (×6): 100 mg via ORAL
  Filled 2022-10-14 (×5): qty 1
  Filled 2022-10-14: qty 14
  Filled 2022-10-14: qty 1

## 2022-10-14 MED ORDER — NICOTINE 14 MG/24HR TD PT24
14.0000 mg | MEDICATED_PATCH | Freq: Every day | TRANSDERMAL | Status: DC
  Administered 2022-10-14: 14 mg via TRANSDERMAL

## 2022-10-14 MED ORDER — LEVOTHYROXINE SODIUM 100 MCG PO TABS
200.0000 ug | ORAL_TABLET | Freq: Every day | ORAL | Status: DC
  Administered 2022-10-15 – 2022-10-19 (×5): 200 ug via ORAL
  Filled 2022-10-14: qty 28
  Filled 2022-10-14 (×5): qty 2

## 2022-10-14 MED ORDER — LORAZEPAM 1 MG PO TABS
1.0000 mg | ORAL_TABLET | Freq: Four times a day (QID) | ORAL | Status: DC | PRN
  Administered 2022-10-14: 1 mg via ORAL
  Filled 2022-10-14: qty 1

## 2022-10-14 MED ORDER — TRAZODONE HCL 50 MG PO TABS: 50.0000 mg | ORAL_TABLET | Freq: Every evening | ORAL | Status: DC | PRN

## 2022-10-14 MED ORDER — MAGNESIUM HYDROXIDE 400 MG/5ML PO SUSP: 30.0000 mL | Freq: Every day | ORAL | Status: DC | PRN

## 2022-10-14 MED ORDER — LOPERAMIDE HCL 2 MG PO CAPS: 2.0000 mg | ORAL_CAPSULE | ORAL | Status: DC | PRN

## 2022-10-14 MED ORDER — TRAZODONE HCL 50 MG PO TABS
50.0000 mg | ORAL_TABLET | Freq: Every evening | ORAL | Status: DC | PRN
  Administered 2022-10-15 – 2022-10-18 (×4): 50 mg via ORAL
  Filled 2022-10-14: qty 1
  Filled 2022-10-14: qty 14
  Filled 2022-10-14 (×3): qty 1

## 2022-10-14 MED ORDER — ADULT MULTIVITAMIN W/MINERALS CH
1.0000 | ORAL_TABLET | Freq: Every day | ORAL | Status: DC
  Administered 2022-10-15 – 2022-10-19 (×5): 1 via ORAL
  Filled 2022-10-14 (×6): qty 1

## 2022-10-14 MED ORDER — ONDANSETRON 4 MG PO TBDP: 4.0000 mg | ORAL_TABLET | Freq: Four times a day (QID) | ORAL | Status: DC | PRN

## 2022-10-14 MED ORDER — LORAZEPAM 1 MG PO TABS: 1.0000 mg | ORAL_TABLET | Freq: Two times a day (BID) | ORAL | Status: DC

## 2022-10-14 MED ORDER — LOPERAMIDE HCL 2 MG PO CAPS: 2.0000 mg | ORAL_CAPSULE | ORAL | Status: AC | PRN

## 2022-10-14 MED ORDER — HYDROXYZINE HCL 25 MG PO TABS: 25.0000 mg | ORAL_TABLET | Freq: Four times a day (QID) | ORAL | Status: DC | PRN

## 2022-10-14 MED ORDER — LORAZEPAM 1 MG PO TABS
1.0000 mg | ORAL_TABLET | Freq: Three times a day (TID) | ORAL | Status: DC
  Administered 2022-10-15 – 2022-10-16 (×2): 1 mg via ORAL
  Filled 2022-10-14 (×3): qty 1

## 2022-10-14 MED ORDER — ALUM & MAG HYDROXIDE-SIMETH 200-200-20 MG/5ML PO SUSP: 30.0000 mL | ORAL | Status: DC | PRN

## 2022-10-14 MED ORDER — ONDANSETRON 4 MG PO TBDP: 4.0000 mg | ORAL_TABLET | Freq: Four times a day (QID) | ORAL | Status: AC | PRN
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
## 2022-10-14 NOTE — ED Provider Notes (Addendum)
Reviewed Thyroid and Free T4 levels with Dr Doran Durand at [facility] @2345  pm, and provider advised to restart patient's thyroid medication due to hypothyroidism if no s/s of distress and he takes it daily, and also have him follow up with his outpatient PCP after discharge.  Patient reports he takes his thyroid medication daily as ordered.  Patient's home Synthroid medication (200 mcg PO Q600 am) reordered.

## 2022-10-14 NOTE — ED Notes (Signed)
Report given to [facility] 

## 2022-10-14 NOTE — ED Notes (Signed)
Pt is A & O x 4. Pt is oriented to the unit and provided with meal. Medication has been administered to him and pt is lying on his bed. Pt denies SI/HI/AVH. Will continue to monitor for safety and provide support.

## 2022-10-14 NOTE — Group Note (Signed)
Group Topic: Balance in Life  Group Date: 10/14/2022 Start Time: 2000 End Time: 2030 Facilitators: Guss Bunde  Department: [facility]  Number of Participants: 5  Group Focus: affirmation Treatment Modality:  Psychoeducation Interventions utilized were group exercise Purpose: reinforce self-care  Name: Danny West Date of Birth: [birth year]  MR: [id number]    Level of Participation: Did not attend Quality of Participation:  Interactions with others:  Mood/Affect:  Triggers (if applicable):  Cognition:  Progress:  Response:  Plan:   Patients Problems:  Patient Active Problem List   Diagnosis Date Noted   Alcohol abuse 10/14/2022   Alcohol-induced mood disorder (HCC) 03/09/2022   H/O suicide attempt 09/14/2021   Alcohol use disorder, severe, dependence (HCC) 09/13/2021   Snuff user 03/05/2021   Mild intermittent asthma without complication 03/05/2021   Acquired hypothyroidism 03/05/2021   Moderate major depression (HCC) 03/05/2021   MDD (major depressive disorder), recurrent severe, without psychosis (HCC) 01/20/2021   Cellulitis of left lower extremity 01/20/2021

## 2022-10-14 NOTE — ED Notes (Signed)
Pt sleeping@this time breathing even and unlabored will continue to monitor for safety 
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
## 2022-10-14 NOTE — Progress Notes (Signed)
   10/14/22 1339  [facility] Triage Screening (Walk-ins at [facility] only)  How Did You Hear About [place]? Other (Comment) (Therapist: Therisa Doyne)  What Is the Reason for Your Visit/Call Today? URGENT: Danny West is a 47 year old male who voluntarily presents to the [facility]al [facility] Urgent [facility]). He has Major Depressive Disorder (MDD) and Alcohol Use Disorder. He was referred by his therapist, Therisa Doyne, from [facility], where he also sees a psychiatrist. Danny West is prescribed Sertraline and is compliant with his medication. Danny West relapsed into alcohol use after finding his father deceased in Jun 15, 2022 or July 16, 2022. Before this, he had been sober for 60 days. Since then, on a daily basis he reports drinking two Mike's Hard Lemonades in the morning, a six-pack of beer in the evening, and an additional 16-ounce beer before bedtime. He denies using drugs. Danny West appears to be under the influence of alcohol and shows visible tremors. He reports diarrhea, nausea, sweating, and chills, but has no history of seizures or delirium tremens (DTs). Patient tried to detox himself this past Sunday from alcohol but says he developed insomnia. He denies current suicidal thoughts but has a history of a suicide attempt after losing contact with his twin boys, who are 49 years old. He also denies homicidal thoughts and auditory/visual hallucinations. Snyder has previously received 30 days of inpatient treatment at [facility] in March 2024. He lives alone, is unemployed, and is requesting detoxification.  How Long Has This Been Causing You Problems? > than 6 months  Have You Recently Had Any Thoughts About Hurting Yourself? No  Are You Planning to Commit Suicide/Harm Yourself At This time? No  Have you Recently Had Thoughts About Hurting Someone [name]? No  Are You Planning To Harm Someone At This Time? No  Are you currently experiencing any auditory, visual or other hallucinations? No  Have You Used Any Alcohol or Drugs in  the Past 24 Hours? Yes  How long ago did you use Drugs or Alcohol? Danny West relapsed into alcohol use after finding his father deceased in Jun 15, 2022 or 07/16/2022. Before this, he had been sober for 60 days. Since his relapse, he reports consuming:  Morning: 2 Mike's Hard Lemonades (approximately 12 ounces total) Evening: 6-pack of beer (approximately 72 ounces total) Before bedtime: 1 additional 16-ounce beer. He last used 1 hr prior to arrival. He denies using drugs.  What Did You Use and How Much? [name] relapsed into alcohol use after finding his father deceased in 06/15/22 or 07/16/2022. Before this, he had been sober for 60 days. Since his relapse in June 15, 2022 or 07/16/2022, he reports consuming: Morning: 2 Mike's Hard Lemonades (approximately 12 ounces total) Evening: 6-pack of beer (approximately 72 ounces total) Before bedtime: 1 additional 16-ounce beer. He last used 1 hr prior to arrival. He denies using drugs.  Do you have any current medical co-morbidities that require immediate attention? No  Clinician description of patient physical appearance/behavior: Tearful, cooperative. Anxious.  What Do You Feel Would Help You the Most Today? Alcohol or Drug Use Treatment  If access to [facility] was not available, would you have sought care in the Emergency Department? No  Determination of Need Urgent (48 hours)  Options For Referral Chemical Dependency Intensive Outpatient Therapy (CDIOP);Facility-Based Crisis;Medication Management

## 2022-10-15 DIAGNOSIS — E039 Hypothyroidism, unspecified: Secondary | ICD-10-CM | POA: Diagnosis not present

## 2022-10-15 DIAGNOSIS — E785 Hyperlipidemia, unspecified: Secondary | ICD-10-CM | POA: Diagnosis not present

## 2022-10-15 DIAGNOSIS — F331 Major depressive disorder, recurrent, moderate: Secondary | ICD-10-CM | POA: Diagnosis not present

## 2022-10-15 DIAGNOSIS — D539 Nutritional anemia, unspecified: Secondary | ICD-10-CM | POA: Diagnosis not present

## 2022-10-15 LAB — RPR: RPR Ser Ql: NONREACTIVE

## 2022-10-15 MED ORDER — NICOTINE 21 MG/24HR TD PT24
21.0000 mg | MEDICATED_PATCH | Freq: Every day | TRANSDERMAL | Status: DC
  Administered 2022-10-15 – 2022-10-19 (×5): 21 mg via TRANSDERMAL
  Filled 2022-10-15: qty 1
  Filled 2022-10-15: qty 14
  Filled 2022-10-15: qty 1
  Filled 2022-10-15: qty 14
  Filled 2022-10-15 (×3): qty 1

## 2022-10-15 NOTE — Progress Notes (Signed)
Pt was visible in the milieu and interacted with with peers. No distress noted and concerns voiced. Staff will monitor for pt's safety.

## 2022-10-15 NOTE — ED Notes (Addendum)
Patient in the dayroom watching TV with other patients. Respirations are even and unlabored.  Will continue to monitor for safety.

## 2022-10-15 NOTE — Group Note (Signed)
Group Topic: Fears and Unhealthy Coping Skills  Group Date: 10/15/2022 Start Time: 1230 End Time: 1250 Facilitators: Salvatore Shear, Jacklynn Barnacle, RN  Department: [facility]  Number of Participants: 4  Group Focus: abuse issues, check in, community group, coping skills, leisure skills, nursing group, and relaxation Treatment Modality:  Patient-Centered Therapy Interventions utilized were exploration, group exercise, and support Purpose: enhance coping skills and relapse prevention strategies  Name: Danny West Date of Birth: March 06, 1975  MR: [id number]    Level of Participation: moderate Quality of Participation: cooperative and quiet Interactions with others: gave feedback Mood/Affect: appropriate Triggers (if applicable): n/a Cognition: coherent/clear and logical Progress: Gaining insight Response: "I have my hobbies - motorcycles, mountain biking, RC cars." - when asked what healthy coping skills they have at their disposal when leaving Plan: patient will be encouraged to continue detox and recovery process, and use healthy coping skills when needed.  Patients Problems:  Patient Active Problem List   Diagnosis Date Noted   Alcohol abuse 10/14/2022   Alcohol-induced mood disorder (HCC) 03/09/2022   H/O suicide attempt 09/14/2021   Alcohol use disorder, severe, dependence (HCC) 09/13/2021   Snuff user 03/05/2021   Mild intermittent asthma without complication 03/05/2021   Acquired hypothyroidism 03/05/2021   Moderate major depression (HCC) 03/05/2021   MDD (major depressive disorder), recurrent severe, without psychosis (HCC) 01/20/2021   Cellulitis of left lower extremity 01/20/2021

## 2022-10-15 NOTE — ED Notes (Signed)
Patient is sleeping. Respirations equal and unlabored, skin warm and dry. No change in assessment or acuity. Routine safety checks conducted according to facility protocol. Will continue to monitor for safety.   

## 2022-10-15 NOTE — Tx Team (Signed)
LCSW met with patient to assess current mood, affect, physical state, and inquire about needs/goals while here in [facility] and after discharge. Patient reports he presented due to needing to detox after relapsing earlier this year. Patient reports he found his father deceased back in 2022/06/14 or 07-15-2022 of this year and it sent him in a downward spiral. Patient reports prior to this he has a 60 day period of Sobriety. Patient reports receiving residential treatment at [facility] back in March of this year and reports success from the program. Patient reports he is currently being seen by a psychiatrist and therapist at [facility] and reports compliance with treatment and medication. Patient reports he was encourage by his Therapist to seek further treatment so he took a lyft here for further help. Patient reports his current issue is alcohol and he denies any other substance use. Patient reports he drinks a six pack of beer, two Mike's hard lemonade, and a 16 oz beer per day and reports he has been drinking this amount for the last couple of months. Patient denies having any withdrawals at this time. Patient reports he lives at home alone and is currently unemployed. Patient reports he has not worked since end of 07/15/22. Patient reports his current stressors is not knowing where his two 49 year old twin boys are, his alcohol use, knowing that his health felt like it was going in the wrong direction, and loss of funds. Patient reports his current goal is to detox and reports he plans is to go to [facility] once stable. Patient denies any current upcoming court dates or legal charges.  Patient denies having access to transportation, and reports having limited social support other than a friend name Tinnie Gens here in [place]. Patient aware that LCSW will send referral out for review and will follow up to provide updates as received. Patient expressed understanding and appreciation of LCSW assistance. No other needs were  reported at this time by patient.   Referral has been sent to [facility] and [facility] for review.  LCSW will continue to follow and provide support to patient while on [facility] unit.   Fernande Boyden, LCSW Clinical Social Worker [place] [facility] Ph: [phone]

## 2022-10-15 NOTE — Discharge Planning (Signed)
Referral was received and per Marcelino Duster at [facility], patient has been accepted and can transfer to the facility on Tuesday 10/19/2022 by 9:00am. Update has been provided to the patient and MD made aware. Patient will need a 14 day supply of medication and one month refill. No nicotine gum allowed, however 14-30 day nicotine patches to be provided if needed. No other needs to report at this time.    LCSW will continue to follow up and provide updates as received.    Fernande Boyden, LCSW Clinical Social Worker [place] [facility] Ph: [phone]

## 2022-10-15 NOTE — ED Notes (Signed)
Patient resting with eyes closed in no apparent acute distress. Respirations even and unlabored. Environment secured. Safety checks in place according to facility policy.

## 2022-10-15 NOTE — Progress Notes (Signed)
Pt is awake, alert and oriented X3. Pt did not voice any complaints of pain or discomfort. No signs of acute distress noted. Administered scheduled meds with per order. Pt denies current SI/HI/AVH, plan or intent. Staff will monitor for pt's safety.

## 2022-10-15 NOTE — Discharge Instructions (Signed)
Patient will discharge to [facility]: [address] [place], [place] [address] on Tuesday 10/19/2022 by 9:00am with transportation provided by [facility].    [facility] [address], [place], [address] [phone] phone  New Patient Assessment/Therapy Walk-Ins:  Monday and Wednesday: 8 am until slots are full. Every 1st and 2nd Fridays of the month: 1 pm - 5 pm.  NO ASSESSMENT/THERAPY WALK-INS ON TUESDAYS OR THURSDAYS  New Patient Assessment/Medication Management Walk-Ins:  Monday - Friday:  8 am - 11 am.  For all walk-ins, we ask that you arrive by 7:30 am because patients will be seen in the order of arrival.  Availability is limited; therefore, you may not be seen on the same day that you walk-in.  Our goal is to serve and meet the needs of our community to the best of our ability.  SUBSTANCE USE TREATMENT for Medicaid and State Funded/IPRS  [facility] [address], [place], [address] [phone] phone NOTE: [facility] is no longer offering IOP services.  Serves those who are low-income or have no insurance.  [facility] [address] [place], [place], [address] [phone] phone [phone] fax NOTE: Does have Substance Abuse-Intensive Outpatient Program [facility]) as well as transitional housing if eligible.  [facility] [address] [place], [place], [address] [phone] phone [phone] fax  [facility] [phone] [place], [place], [address] [phone] phone [phone] fax  HALFWAY HOUSES:  [facility] [phone]  [email or web address]  12 STEP PROGRAMS:  Alcoholics Anonymous of [place] [employer]et[employer]be  Narcotics Anonymous of [place] [employer]dk  Al-Anon of [place], [place] www.g[place]alanon.org/find-meetings.html  Nar-Anon  https://nar-anon.org/find-a-meetin  List of Residential placements:   [facility] in [place]: [phone]  [facility]: [phone]  [place], [place] [phone]: Male and male facility; 30-day program: (uninsured and Medicaid such as Laurena Bering, [place], [place], partners)  [facility]: [phone]; men and women's facility; 28 days; Can have Medicaid tailored plan Tour manager or Partners)  [facility]: [phone] Karoline Caldwell or Larita Fife; 28 day program; must be fully detox; tailored Medicaid or no insurance  [address] in [place], [place]; [phone]; 28 day all males program; no insurance accepted  [facility] Ref[facility] in [place]: Gabriel Rung [phone] (no insurance or Medicaid only); 90 days; outpatient services but provide housing in apartments [place]  RTS Admission: [phone]: Patient must complete phone screening for placement: [place], [place]; 6 month program; uninsured, Medicaid, and [employer].   [facility]: no insurance required; [phone]  [facility]: [phone]; Intake: Molly Maduro; Must fill out application online; Alecia Lemming Delay [phone] x [address] [facility] in [place], [place]: [phone]; Admissions Coordinators Mr. Maurine Minister or Wang [place]; 90 day program.

## 2022-10-15 NOTE — Group Note (Signed)
Group Topic: Communication  Group Date: 10/15/2022 Start Time: 2016 End Time: 2117 Facilitators: Rae Lips B  Department: [facility]  Number of Participants: 3  Group Focus: acceptance Treatment Modality:  Individual Therapy Interventions utilized were story telling and support Purpose: regain self-worth  Name: Danny West Date of Birth: 09-02-1975  MR: [id number]    Level of Participation: Active Quality of Participation: attentive, cooperative, and offered feedback Interactions with others: gave feedback Mood/Affect: appropriate Triggers (if applicable): Drinking.  Cognition: coherent/clear and concrete Progress: Gaining insight Response: Has a good outlook on life after this.  Plan: patient will be encouraged to keep going to groups.   Patients Problems:  Patient Active Problem List   Diagnosis Date Noted   Alcohol abuse 10/14/2022   Alcohol-induced mood disorder (HCC) 03/09/2022   H/O suicide attempt 09/14/2021   Alcohol use disorder, severe, dependence (HCC) 09/13/2021   Snuff user 03/05/2021   Mild intermittent asthma without complication 03/05/2021   Acquired hypothyroidism 03/05/2021   Moderate major depression (HCC) 03/05/2021   MDD (major depressive disorder), recurrent severe, without psychosis (HCC) 01/20/2021   Cellulitis of left lower extremity 01/20/2021

## 2022-10-15 NOTE — Group Note (Signed)
Group Topic: Social Support  Group Date: 10/15/2022 Start Time: 1000 End Time: 1030 Facilitators: Priscille Kluver, NT  Department: [facility]  Number of Participants: 4  Group Focus: community group Treatment Modality:  Solution-Focused Therapy Interventions utilized were support Purpose: enhance coping skills  Name: Danny West Date of Birth: Oct 04, 1975  MR: [id number]    Level of Participation: moderate Quality of Participation: attentive Interactions with others: gave feedback Mood/Affect: positive Cognition: coherent/clear Progress: Gaining insight Response: Wanting to go to a longer treatment program  Patients Problems:  Patient Active Problem List   Diagnosis Date Noted   Alcohol abuse 10/14/2022   Alcohol-induced mood disorder (HCC) 03/09/2022   H/O suicide attempt 09/14/2021   Alcohol use disorder, severe, dependence (HCC) 09/13/2021   Snuff user 03/05/2021   Mild intermittent asthma without complication 03/05/2021   Acquired hypothyroidism 03/05/2021   Moderate major depression (HCC) 03/05/2021   MDD (major depressive disorder), recurrent severe, without psychosis (HCC) 01/20/2021   Cellulitis of left lower extremity 01/20/2021

## 2022-10-15 NOTE — ED Notes (Signed)
Patient is sleeping. Respirations equal and unlabored, skin warm and dry, NAD. No change in assessment or acuity. Routine safety checks conducted according to facility protocol. Will continue to monitor for safety.   

## 2022-10-15 NOTE — ED Provider Notes (Signed)
Behavioral Health Progress Note  Date and Time: 10/15/2022 12:55 PM Name: Danny West MRN:  [id number]  Subjective:  I am feeling better".   Diagnosis:  Final diagnoses:  None  Danny West is a 47 year-old male admitted yesterday  secondary to alcohol abuse problem and seeking treatment.  He reported that he relapsed back in May when he found his dad on the floor dead. Last 11/12/22, patient went to a family party and "everyone was drinking and drank". He reported feeling guilty and remorseful and tried to detox himself but would get very sick each time he tried. Patient saw his substance abuse counselor and was advise to seek professional treatment.  Patient presented with increased depression and  reported withdrawal symptoms including tremors, sweating, malaise and runny nose. He reported that he wants to regain sobriety through a long term treatment program. He was admitted and detox protocol initiated.   Today upon assessment: Patient is seen ace-to-face by this NP. He was actively  attending group. He is evaluated in the dayroom and privacy ensured. Danny West is calm and cooperative but appears to be tired. He is alert and oriented x 4. His thought process is clear and goal-directed. He denies SI/HI/AVH. He reports that he is feeling better and symptoms have decreased. No visible tremors noted. Patient reports no nausea/vomiting and his appetite is improved.  Patient reports that he slept better with Trazodone but feels like Seroquel would give him better sleep.  He continues to reports that he wants to continue to [facility] upon completion of detox protocol. He reports that he discussed it with SW today. Patient reports that he is eating better.  He has no sign of distress.  Continues to express motivation for a long term treatment. Per SW, patient has been accepted at [facility].   Patient reports no major withdrawal symptoms. He is active in groups and remains cooperative.  We will continue to provide  support and encouragements. Patient will continue on current medication regimen.   Total Time spent with patient: 20 minutes  Past Psychiatric History: SUD, MDD Past Medical History:  Family History: NA Family Psychiatric  History: NA Social History: NA  Additional Social History:                        Sleep: Fair  Appetite:  Fair  Current Medications:  Current Facility-Administered Medications  Medication Dose Route Frequency Provider Last Rate Last Admin   acetaminophen (TYLENOL) tablet 650 mg  650 mg Oral Q6H PRN Onuoha, Chinwendu V, NP       alum & mag hydroxide-simeth (MAALOX/MYLANTA) 200-200-20 MG/5ML suspension 30 mL  30 mL Oral Q4H PRN Onuoha, Chinwendu V, NP       hydrOXYzine (ATARAX) tablet 25 mg  25 mg Oral Q6H PRN Onuoha, Chinwendu V, NP       levothyroxine (SYNTHROID) tablet 200 mcg  200 mcg Oral Q0600 Onuoha, Chinwendu V, NP   200 mcg at 10/15/22 4098   loperamide (IMODIUM) capsule 2-4 mg  2-4 mg Oral PRN Onuoha, Chinwendu V, NP       LORazepam (ATIVAN) tablet 1 mg  1 mg Oral Q6H PRN Onuoha, Chinwendu V, NP       LORazepam (ATIVAN) tablet 1 mg  1 mg Oral QID Onuoha, Chinwendu V, NP   1 mg at 10/15/22 0841   Followed by   LORazepam (ATIVAN) tablet 1 mg  1 mg Oral TID Onuoha, Chinwendu V, NP  Followed by   Melene Muller ON 10/16/2022] LORazepam (ATIVAN) tablet 1 mg  1 mg Oral BID Onuoha, Chinwendu V, NP       Followed by   Melene Muller ON 10/18/2022] LORazepam (ATIVAN) tablet 1 mg  1 mg Oral Daily Onuoha, Chinwendu V, NP       magnesium hydroxide (MILK OF MAGNESIA) suspension 30 mL  30 mL Oral Daily PRN Onuoha, Chinwendu V, NP       multivitamin with minerals tablet 1 tablet  1 tablet Oral Daily Onuoha, Chinwendu V, NP   1 tablet at 10/15/22 0840   nicotine (NICODERM CQ - dosed in mg/24 hours) patch 21 mg  21 mg Transdermal Daily Danny Loveland M, NP   21 mg at 10/15/22 1222   ondansetron (ZOFRAN-ODT) disintegrating tablet 4 mg  4 mg Oral Q6H PRN Onuoha,  Chinwendu V, NP       sertraline (ZOLOFT) tablet 100 mg  100 mg Oral Daily Onuoha, Chinwendu V, NP   100 mg at 10/15/22 0840   thiamine (VITAMIN B1) injection 100 mg  100 mg Intramuscular Once Onuoha, Chinwendu V, NP       thiamine (VITAMIN B1) tablet 100 mg  100 mg Oral Daily Onuoha, Chinwendu V, NP   100 mg at 10/15/22 0840   traZODone (DESYREL) tablet 50 mg  50 mg Oral QHS PRN Onuoha, Chinwendu V, NP       Current Outpatient Medications  Medication Sig Dispense Refill   albuterol (VENTOLIN HFA) 108 (90 Base) MCG/ACT inhaler Inhale 2 puffs into the lungs every 6 (six) hours as needed for wheezing or shortness of breath.     Ascorbic Acid (VITAMIN C ADULT GUMMIES PO) Take 2 tablets by mouth daily.     ibuprofen (ADVIL) 200 MG tablet Take 400 mg by mouth every 6 (six) hours as needed (For pain).     levothyroxine (SYNTHROID) 200 MCG tablet Take 1 tablet (200 mcg total) by mouth daily before breakfast. 30 tablet 4   Omega-3 Fatty Acids (OMEGA 3 PO) Take 1 capsule by mouth daily.     sertraline (ZOLOFT) 100 MG tablet Take 1 tablet (100 mg total) by mouth daily.     triamcinolone ointment (KENALOG) 0.1 % Apply 1 Application topically 2 (two) times daily as needed (Apply to affected area).      Labs  Lab Results:  Admission on 10/14/2022  Component Date Value Ref Range Status   Free T4 10/14/2022 0.38 (L)  0.61 - 1.12 ng/dL Final   Comment: (NOTE) Biotin ingestion may interfere with free T4 tests. If the results are inconsistent with the TSH level, previous test results, or the clinical presentation, then consider biotin interference. If needed, order repeat testing after stopping biotin. Performed at [facility], [address], [place], [place] [address]   Admission on 10/14/2022, Discharged on 10/14/2022  Component Date Value Ref Range Status   WBC 10/14/2022 4.9  4.0 - 10.5 K/uL Final   RBC 10/14/2022 3.10 (L)  4.22 - 5.81 MIL/uL Final   Hemoglobin 10/14/2022 11.1 (L)  13.0 -  17.0 g/dL Final   HCT 47/82/9562 32.5 (L)  39.0 - 52.0 % Final   MCV 10/14/2022 104.8 (H)  80.0 - 100.0 fL Final   MCH 10/14/2022 35.8 (H)  26.0 - 34.0 pg Final   MCHC 10/14/2022 34.2  30.0 - 36.0 g/dL Final   RDW 13/08/6576 16.3 (H)  11.5 - 15.5 % Final   Platelets 10/14/2022 91 (L)  150 -  400 K/uL Final   Comment: SPECIMEN CHECKED FOR CLOTS Immature Platelet Fraction may be clinically indicated, consider ordering this additional test [id number] REPEATED TO VERIFY    nRBC 10/14/2022 0.0  0.0 - 0.2 % Final   Neutrophils Relative % 10/14/2022 68  % Final   Neutro Abs 10/14/2022 3.3  1.7 - 7.7 K/uL Final   Lymphocytes Relative 10/14/2022 22  % Final   Lymphs Abs 10/14/2022 1.1  0.7 - 4.0 K/uL Final   Monocytes Relative 10/14/2022 8  % Final   Monocytes Absolute 10/14/2022 0.4  0.1 - 1.0 K/uL Final   Eosinophils Relative 10/14/2022 1  % Final   Eosinophils Absolute 10/14/2022 0.1  0.0 - 0.5 K/uL Final   Basophils Relative 10/14/2022 1  % Final   Basophils Absolute 10/14/2022 0.1  0.0 - 0.1 K/uL Final   Immature Granulocytes 10/14/2022 0  % Final   Abs Immature Granulocytes 10/14/2022 0.02  0.00 - 0.07 K/uL Final   Performed at [facility], [address] [place], [place] [address]   Sodium 10/14/2022 139  135 - 145 mmol/L Final   Potassium 10/14/2022 3.9  3.5 - 5.1 mmol/L Final   Chloride 10/14/2022 103  98 - 111 mmol/L Final   CO2 10/14/2022 26  22 - 32 mmol/L Final   Glucose, Bld 10/14/2022 104 (H)  70 - 99 mg/dL Final   Glucose reference range applies only to samples taken after fasting for at least 8 hours.   BUN 10/14/2022 8  6 - 20 mg/dL Final   Creatinine, Ser 10/14/2022 0.77  0.61 - 1.24 mg/dL Final   Calcium 13/08/6576 9.2  8.9 - 10.3 mg/dL Final   Total Protein 46/96/2952 7.8  6.5 - 8.1 g/dL Final   Albumin 84/13/2440 3.4 (L)  3.5 - 5.0 g/dL Final   AST 11/07/2534 99 (H)  15 - 41 U/L Final   ALT 10/14/2022 36  0 - 44 U/L Final   Alkaline Phosphatase 10/14/2022  236 (H)  38 - 126 U/L Final   Total Bilirubin 10/14/2022 2.5 (H)  0.3 - 1.2 mg/dL Final   GFR, Estimated 10/14/2022 >60  >60 mL/min Final   Comment: (NOTE) Calculated using the CKD-EPI Creatinine Equation (2021)    Anion gap 10/14/2022 10  5 - 15 Final   Performed at [facility], [address], [place], [place] [address]   Hgb A1c MFr Bld 10/14/2022 4.1 (L)  4.8 - 5.6 % Final   Comment: (NOTE) Pre diabetes:          5.7%-6.4%  Diabetes:              >6.4%  Glycemic control for   <7.0% adults with diabetes    Mean Plasma Glucose 10/14/2022 70.97  mg/dL Final   Performed at [facility], [address] [place], [place] [address]   Magnesium 10/14/2022 2.0  1.7 - 2.4 mg/dL Final   Performed at [facility], [address] [place], [place] [address]   Alcohol, Ethyl (B) 10/14/2022 <10  <10 mg/dL Final   Comment: (NOTE) Lowest detectable limit for serum alcohol is 10 mg/dL.  For medical purposes only. Performed at [facility], [address], [place], [place] [address]    RPR Ser Ql 10/14/2022 NON REACTIVE  NON REACTIVE Final   Performed at [facility], [address], [place], [place] [address]   Color, Urine 10/14/2022 AMBER (A)  YELLOW Final  BIOCHEMICALS MAY BE AFFECTED BY COLOR   APPearance 10/14/2022 CLEAR  CLEAR Final   Specific Gravity, Urine 10/14/2022 1.017  1.005 - 1.030 Final   pH 10/14/2022 6.0  5.0 - 8.0 Final   Glucose, UA 10/14/2022 NEGATIVE  NEGATIVE mg/dL Final   Hgb urine dipstick 10/14/2022 NEGATIVE  NEGATIVE Final   Bilirubin Urine 10/14/2022 NEGATIVE  NEGATIVE Final   Ketones, ur 10/14/2022 NEGATIVE  NEGATIVE mg/dL Final   Protein, ur 16/10/9602 NEGATIVE  NEGATIVE mg/dL Final   Nitrite 54/09/8117 NEGATIVE  NEGATIVE Final   Leukocytes,Ua 10/14/2022 NEGATIVE  NEGATIVE Final   Performed at [facility], [address] [place], [place] [address]   POC Amphetamine UR 10/14/2022 None Detected  NONE DETECTED (Cut Off  Level 1000 ng/mL) Final   POC Secobarbital (BAR) 10/14/2022 None Detected  NONE DETECTED (Cut Off Level 300 ng/mL) Final   POC Buprenorphine (BUP) 10/14/2022 None Detected  NONE DETECTED (Cut Off Level 10 ng/mL) Final   POC Oxazepam (BZO) 10/14/2022 Positive (A)  NONE DETECTED (Cut Off Level 300 ng/mL) Final   POC Cocaine UR 10/14/2022 None Detected  NONE DETECTED (Cut Off Level 300 ng/mL) Final   POC Methamphetamine UR 10/14/2022 None Detected  NONE DETECTED (Cut Off Level 1000 ng/mL) Final   POC Morphine 10/14/2022 None Detected  NONE DETECTED (Cut Off Level 300 ng/mL) Final   POC Methadone UR 10/14/2022 None Detected  NONE DETECTED (Cut Off Level 300 ng/mL) Final   POC Oxycodone UR 10/14/2022 None Detected  NONE DETECTED (Cut Off Level 100 ng/mL) Final   POC Marijuana UR 10/14/2022 None Detected  NONE DETECTED (Cut Off Level 50 ng/mL) Final   Cholesterol 10/14/2022 275 (H)  0 - 200 mg/dL Final   Triglycerides 95/62/1308 255 (H)  <150 mg/dL Final   HDL 65/78/4696 51  >40 mg/dL Final   Total CHOL/HDL Ratio 10/14/2022 5.4  RATIO Final   VLDL 10/14/2022 51 (H)  0 - 40 mg/dL Final   LDL Cholesterol 10/14/2022 173 (H)  0 - 99 mg/dL Final   Comment:        Total Cholesterol/HDL:CHD Risk Coronary Heart Disease Risk Table                     Men   Women  1/2 Average Risk   3.4   3.3  Average Risk       5.0   4.4  2 X Average Risk   9.6   7.1  3 X Average Risk  23.4   11.0        Use the calculated Patient Ratio above and the CHD Risk Table to determine the patient's CHD Risk.        ATP III CLASSIFICATION (LDL):  <100     mg/dL   Optimal  295-284  mg/dL   Near or Above                    Optimal  130-159  mg/dL   Borderline  132-440  mg/dL   High  >102     mg/dL   Very High Performed at [facility], [address], [place], [place] [address]    TSH 10/14/2022 155.020 (H)  0.350 - 4.500 uIU/mL Final   Comment: Performed by a 3rd Generation assay with a functional sensitivity of  <=0.01 uIU/mL. Performed at [facility], [address], [place], [place] [address]     Blood Alcohol level:  Lab Results  Component Value Date   ETH <10 10/14/2022   ETH 53 (H) 03/09/2022    Metabolic Disorder Labs: Lab Results  Component Value Date   HGBA1C 4.1 (L) 10/14/2022   MPG 70.97 10/14/2022   MPG 100 03/09/2022   Lab Results  Component Value Date   PROLACTIN 10.0 03/09/2022   Lab Results  Component Value Date   CHOL 275 (H) 10/14/2022   TRIG 255 (H) 10/14/2022   HDL 51 10/14/2022   CHOLHDL 5.4 10/14/2022   VLDL 51 (H) 10/14/2022   LDLCALC 173 (H) 10/14/2022   LDLCALC UNABLE TO CALCULATE IF TRIGLYCERIDE OVER 400 mg/dL 16/10/9602    Therapeutic Lab Levels: No results found for: "LITHIUM" No results found for: "VALPROATE" No results found for: "CBMZ"  Physical Findings   AIMS    Flowsheet Row [facility]  AIMS Total Score 0      AUDIT    Flowsheet Row Ad[facility] (Dis[facility]  Alcohol Use Disorder Identification Test Final Score (AUDIT) 13      GAD-7    Flowsheet Row Office Visit from 01/22/2022 in [facility]  Total GAD-7 Score 1      PHQ2-9    Flowsheet Row ED from 10/14/2022 in [facility] Most recent reading at 10/15/2022 12:54 PM ED from 10/14/2022 in [facility] Most recent reading at 10/14/2022  3:19 PM Office Visit from 01/22/2022 in [facility] Most recent reading at 01/22/2022 10:00 AM ED from 09/13/2021 in [facility] Most recent reading at 09/19/2021 10:07 AM Office Visit from 05/18/2021 in [facility] Most recent reading at 05/18/2021 10:41 AM  PHQ-2 Total Score 2 2 0 1 0  PHQ-9 Total Score 6 10 1 6 3        Flowsheet Row ED from 10/14/2022 in [facility] Most recent reading at 10/14/2022  9:56 PM ED from 10/14/2022 in [facility] Most recent reading at 10/14/2022  4:58 PM ED from 03/09/2022 in [facility] Most recent reading at 03/09/2022  4:17 PM  C-SSRS RISK CATEGORY No Risk Error: Q3, 4, or 5 should not be populated when Q2 is No No Risk        Musculoskeletal  Strength & Muscle Tone: within normal limits Gait & Station: normal Patient leans: N/A  Psychiatric Specialty Exam  Presentation  General Appearance:  Appropriate for Environment  Eye Contact: Good  Speech: Clear and Coherent  Speech Volume: Normal  Handedness: Right   Mood and Affect  Mood: Depressed; Anxious  Affect: Appropriate   Thought Process  Thought Processes: Coherent; Goal Directed  Descriptions of Associations:Intact  Orientation:Full (Time, Place and Person)  Thought Content:Logical  Diagnosis of Schizophrenia or Schizoaffective disorder in past: No    Hallucinations:Hallucinations: None  Ideas of Reference:None  Suicidal Thoughts:Suicidal Thoughts: No  Homicidal Thoughts:Homicidal Thoughts: No   Sensorium  Memory: Immediate Good; Recent Good; Remote Good  Judgment: Fair  Insight: Fair   Chartered certified accountant: Fair  Attention Span: Fair  Recall: [employer] of Knowledge: Fair  Language: Fair   Psychomotor Activity  Psychomotor Activity: Psychomotor Activity: Normal   Assets  Assets: Communication Skills; Desire for Improvement; Housing; Physical Health   Sleep  Sleep: Sleep:  Fair Number of Hours of Sleep: 6   Nutritional Assessment (For OBS and FBC admissions only) Has the patient had a weight loss or gain of 10 pounds or more in the last 3 months?: No Has the patient had a decrease in food intake/or appetite?: No Does the patient have dental  problems?: No Does the patient have eating habits or behaviors that may be indicators of an eating disorder including binging or inducing vomiting?: No Has the patient recently lost weight without trying?: 0 Has the patient been eating poorly because of a decreased appetite?: 0 Malnutrition Screening Tool Score: 0    Physical Exam  Physical Exam Vitals and nursing note reviewed.  Constitutional:      Appearance: Normal appearance.  HENT:     Head: Normocephalic and atraumatic.     Right Ear: Tympanic membrane normal.     Left Ear: Tympanic membrane normal.     Nose: Nose normal.     Mouth/Throat:     Mouth: Mucous membranes are moist.  Eyes:     Extraocular Movements: Extraocular movements intact.     Pupils: Pupils are equal, round, and reactive to light.  Cardiovascular:     Rate and Rhythm: Normal rate.  Pulmonary:     Effort: Pulmonary effort is normal.  Musculoskeletal:        General: Normal range of motion.     Cervical back: Normal range of motion and neck supple.  Neurological:     General: No focal deficit present.     Mental Status: Danny West is alert and oriented to person, place, and time.    Review of Systems  Constitutional: Negative.   HENT: Negative.    Eyes: Negative.   Respiratory: Negative.    Cardiovascular: Negative.   Gastrointestinal: Negative.   Genitourinary: Negative.   Musculoskeletal: Negative.   Neurological: Negative.   Endo/Heme/Allergies: Negative.   Psychiatric/Behavioral:  Positive for depression and substance abuse.    Blood pressure 126/86, pulse 93, temperature 98.3 F (36.8 C), temperature source Oral, resp. rate 16, SpO2 100%. There is no height or weight on file to calculate BMI.  Treatment Plan Summary: Daily contact with patient to assess and evaluate symptoms and progress in treatment and Medication management  Olin Pia, NP 10/15/2022 12:55 PM

## 2022-10-16 DIAGNOSIS — D539 Nutritional anemia, unspecified: Secondary | ICD-10-CM | POA: Diagnosis not present

## 2022-10-16 DIAGNOSIS — E785 Hyperlipidemia, unspecified: Secondary | ICD-10-CM | POA: Diagnosis not present

## 2022-10-16 DIAGNOSIS — E039 Hypothyroidism, unspecified: Secondary | ICD-10-CM | POA: Diagnosis not present

## 2022-10-16 DIAGNOSIS — F331 Major depressive disorder, recurrent, moderate: Secondary | ICD-10-CM | POA: Diagnosis not present

## 2022-10-16 MED ORDER — LORAZEPAM 1 MG PO TABS
1.0000 mg | ORAL_TABLET | Freq: Three times a day (TID) | ORAL | Status: AC
  Administered 2022-10-16: 1 mg via ORAL
  Filled 2022-10-16: qty 1

## 2022-10-16 MED ORDER — LORAZEPAM 1 MG PO TABS
1.0000 mg | ORAL_TABLET | ORAL | Status: AC
  Administered 2022-10-16: 1 mg via ORAL
  Filled 2022-10-16: qty 1

## 2022-10-16 MED ORDER — LORAZEPAM 1 MG PO TABS
1.0000 mg | ORAL_TABLET | Freq: Two times a day (BID) | ORAL | Status: AC
  Administered 2022-10-17 – 2022-10-18 (×3): 1 mg via ORAL
  Filled 2022-10-16 (×3): qty 1

## 2022-10-16 NOTE — ED Notes (Signed)
Pt was provided dinner.

## 2022-10-16 NOTE — ED Notes (Addendum)
Ativan that was ordered at 1600 was not showing up in the pixis . Rn notified pharmacy at [facility] states that they can not figure out the issue and asked if I could over ride the medication . The medication was not showing up so that rn could over ride so pharmacy states to ask provider to put a new order in. Provider put one time dose now and it was given at 1615

## 2022-10-16 NOTE — Group Note (Signed)
Group Topic: Communication  Group Date: 10/16/2022 Start Time: 1104 End Time: 1126 Facilitators: Merrie Roof, RN  Department: [facility]  Number of Participants: 6  Group Focus: communication Treatment Modality:  Patient-Centered Therapy Interventions utilized were group exercise Purpose: express feelings  Name: Danny West Date of Birth: [birth year]  MR: [id number]    Level of Participation: active Quality of Participation: cooperative Interactions with others: gave feedback Mood/Affect: appropriate Triggers (if applicable): na Cognition: coherent/clear Progress: Significant Response: good Plan: patient will be encouraged to continue with therpay  Patients Problems:  Patient Active Problem List   Diagnosis Date Noted   Alcohol abuse 10/14/2022   Alcohol-induced mood disorder (HCC) 03/09/2022   H/O suicide attempt 09/14/2021   Alcohol use disorder, severe, dependence (HCC) 09/13/2021   Snuff user 03/05/2021   Mild intermittent asthma without complication 03/05/2021   Acquired hypothyroidism 03/05/2021   Moderate major depression (HCC) 03/05/2021   MDD (major depressive disorder), recurrent severe, without psychosis (HCC) 01/20/2021   Cellulitis of left lower extremity 01/20/2021

## 2022-10-16 NOTE — ED Notes (Addendum)
Patient  is in the shower. Environment is secured. Will continue to monitor for safety.

## 2022-10-16 NOTE — Group Note (Signed)
Group Topic: Relapse and Recovery  Group Date: 10/16/2022 Start Time: 2000 End Time: 2100 Facilitators: Rae Lips B  Department: [facility]  Number of Participants: 6  Group Focus: activities of daily living skills, chemical dependency education, and chemical dependency issues Treatment Modality:  Leisure Development and Spiritual Interventions utilized were problem solving, reality testing, story telling, and support Purpose: express feelings  Name: Puneet Masoner Date of Birth: May 04, 1975  MR: [id number]    Level of Participation: active Quality of Participation: attentive and cooperative Interactions with others: gave feedback Mood/Affect: appropriate Triggers (if applicable): na Cognition: coherent/clear and concrete Progress: Gaining insight Response: na Plan: patient will be encouraged to KEEP GOING TO GROUPS  Patients Problems:  Patient Active Problem List   Diagnosis Date Noted   Alcohol abuse 10/14/2022   Alcohol-induced mood disorder (HCC) 03/09/2022   H/O suicide attempt 09/14/2021   Alcohol use disorder, severe, dependence (HCC) 09/13/2021   Snuff user 03/05/2021   Mild intermittent asthma without complication 03/05/2021   Acquired hypothyroidism 03/05/2021   Moderate major depression (HCC) 03/05/2021   MDD (major depressive disorder), recurrent severe, without psychosis (HCC) 01/20/2021   Cellulitis of left lower extremity 01/20/2021

## 2022-10-16 NOTE — ED Notes (Signed)
 Patient in the bedroom sleeping. NAD.  Respirations are even and unlabored. Will continue to monitor for safety.

## 2022-10-16 NOTE — ED Notes (Signed)
Patient in milieu. Environment is secured. Will continue to monitor for safety. 

## 2022-10-16 NOTE — ED Notes (Signed)
Pt was provided breakfast.

## 2022-10-16 NOTE — ED Notes (Signed)
RN approached patient to collect blood work ordered from 10/03. Patient responded, "I already got all these when I came in". Reported to incoming staff.

## 2022-10-16 NOTE — ED Provider Notes (Addendum)
Behavioral Health Progress Note  Date and Time: 10/16/2022 3:31 PM Name: Danny West MRN:  [id number]  Subjective:  I am feeling better".   Diagnosis:  Final diagnoses:  None  Danny West is a 47 y.o. adult with PMH MDD, GAD, alcohol use d/o (no w/d hx), stimulant use d/o, hypothyroidism (on synthroid), suicide attempt (09/2021), inpatient psych admission, who was admitted to [facility] (10/14/2022) secondary to alcohol abuse problem and seeking treatment.  He reported that he relapsed back in May when he found his dad on the floor dead. Last 11-Nov-2022, patient went to a family party and "everyone was drinking and drank". He reported feeling guilty and remorseful and tried to detox himself but would get very sick each time he tried. Patient saw his substance abuse counselor and was advise to seek professional treatment.  Patient presented with increased depression and  reported withdrawal symptoms including tremors, sweating, malaise and runny nose. He reported that he wants to regain sobriety through a long term treatment program. He was admitted and detox protocol initiated.   Today upon assessment:  Reported mood is good, currently sleepy but feeling better. Sleep and appetite otherwise intact and appropriate.  Denied craving at this time.   Denied active and passive SI, HI, AVH, paranoia.     Total Time spent with patient: 20 minutes  Past Psychiatric History:  Dx: MDD, GAD, alcohol use d/o (no w/d hx), stimulant use d/o (vyvanse, adderal), self-reported ADHD Prior Rx: naltrexone 50 mg daily. Prozac in 1998, and Zoloft from 2019-January of 2022. Cytomel 25 mg daily Current OP psychiatrist: Denied Prior OP psychiatrist: Unsure Current OP therapist: [facility] at [facility] [place] PCP: Denied Suicide Attempt: Yes, June 2023 - OD on handful of zoloft and naltrexone, wrote on his white board at his apartment that if he wakes up the next day, he needs to live for his children, did not seek  medical attention or told therapist.  Inpatient psych: 01/24/2021 [facility] Violence/Aggression: Denied  Substance use EtOH: Yes. Twisted tea x1 in AM & 14oz beer x4 after work. 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[employer], Health and safety inspector Social History:                          Current Medications:  Current Facility-Administered Medications  Medication Dose Route Frequency Provider Last Rate Last Admin   acetaminophen (TYLENOL) tablet 650 mg  650 mg Oral Q6H PRN Onuoha, Chinwendu V, NP       alum & mag hydroxide-simeth (MAALOX/MYLANTA) 200-200-20 MG/5ML suspension 30 mL  30 mL Oral Q4H PRN Onuoha, Chinwendu V, NP       hydrOXYzine (ATARAX) tablet 25 mg  25 mg Oral Q6H PRN Onuoha, Chinwendu V, NP       levothyroxine (SYNTHROID) tablet 200 mcg  200 mcg Oral Q0600 Onuoha, Chinwendu V, NP   200 mcg at 10/16/22 4098   loperamide (IMODIUM) capsule 2-4 mg  2-4 mg Oral  PRN Onuoha, Chinwendu V, NP       LORazepam (ATIVAN) tablet 1 mg  1 mg Oral Q6H PRN Onuoha, Chinwendu V, NP       LORazepam (ATIVAN) tablet 1 mg  1 mg Oral TID Onuoha, Chinwendu V, NP   1 mg at 10/16/22 0950   Followed by   LORazepam (ATIVAN) tablet 1 mg  1 mg Oral BID Onuoha, Chinwendu V, NP       Followed by   Melene Muller ON 10/18/2022] LORazepam (ATIVAN) tablet 1 mg  1 mg Oral Daily Onuoha, Chinwendu V, NP       magnesium hydroxide (MILK OF MAGNESIA) suspension  30 mL  30 mL Oral Daily PRN Onuoha, Chinwendu V, NP       multivitamin with minerals tablet 1 tablet  1 tablet Oral Daily Onuoha, Chinwendu V, NP   1 tablet at 10/16/22 0950   nicotine (NICODERM CQ - dosed in mg/24 hours) patch 21 mg  21 mg Transdermal Daily Rayburn Go, Veronique M, NP   21 mg at 10/16/22 0953   ondansetron (ZOFRAN-ODT) disintegrating tablet 4 mg  4 mg Oral Q6H PRN Onuoha, Chinwendu V, NP       sertraline (ZOLOFT) tablet 100 mg  100 mg Oral Daily Onuoha, Chinwendu V, NP   100 mg at 10/16/22 0950   thiamine (VITAMIN B1) injection 100 mg  100 mg Intramuscular Once Onuoha, Chinwendu V, NP       thiamine (VITAMIN B1) tablet 100 mg  100 mg Oral Daily Onuoha, Chinwendu V, NP   100 mg at 10/16/22 0950   traZODone (DESYREL) tablet 50 mg  50 mg Oral QHS PRN Onuoha, Chinwendu V, NP   50 mg at 10/15/22 2114   Current Outpatient Medications  Medication Sig Dispense Refill   albuterol (VENTOLIN HFA) 108 (90 Base) MCG/ACT inhaler Inhale 2 puffs into the lungs every 6 (six) hours as needed for wheezing or shortness of breath.     Ascorbic Acid (VITAMIN C ADULT GUMMIES PO) Take 2 tablets by mouth daily.     ibuprofen (ADVIL) 200 MG tablet Take 400 mg by mouth every 6 (six) hours as needed (For pain).     levothyroxine (SYNTHROID) 200 MCG tablet Take 1 tablet (200 mcg total) by mouth daily before breakfast. 30 tablet 4   Omega-3 Fatty Acids (OMEGA 3 PO) Take 1 capsule by mouth daily.     sertraline (ZOLOFT) 100 MG tablet Take 1 tablet (100 mg total) by mouth daily.     triamcinolone ointment (KENALOG) 0.1 % Apply 1 Application topically 2 (two) times daily as needed (Apply to affected area).      Labs  Lab Results:  Admission on 10/14/2022  Component Date Value Ref Range Status   Free T4 10/14/2022 0.38 (L)  0.61 - 1.12 ng/dL Final   Comment: (NOTE) Biotin ingestion may interfere with free T4 tests. If the results are inconsistent with the TSH level, previous test results, or the clinical  presentation, then consider biotin interference. If needed, order repeat testing after stopping biotin. Performed at [facility], [address], [place], [place] [address]   Admission on 10/14/2022, Discharged on 10/14/2022  Component Date Value Ref Range Status   WBC 10/14/2022 4.9  4.0 - 10.5 K/uL Final   RBC 10/14/2022 3.10 (L)  4.22 - 5.81 MIL/uL Final   Hemoglobin 10/14/2022 11.1 (L)  13.0 - 17.0 g/dL Final   HCT 60/45/4098 32.5 (L)  39.0 - 52.0 % Final  MCV 10/14/2022 104.8 (H)  80.0 - 100.0 fL Final   MCH 10/14/2022 35.8 (H)  26.0 - 34.0 pg Final   MCHC 10/14/2022 34.2  30.0 - 36.0 g/dL Final   RDW 78/29/5621 16.3 (H)  11.5 - 15.5 % Final   Platelets 10/14/2022 91 (L)  150 - 400 K/uL Final   Comment: SPECIMEN CHECKED FOR CLOTS Immature Platelet Fraction may be clinically indicated, consider ordering this additional test [id number] REPEATED TO VERIFY    nRBC 10/14/2022 0.0  0.0 - 0.2 % Final   Neutrophils Relative % 10/14/2022 68  % Final   Neutro Abs 10/14/2022 3.3  1.7 - 7.7 K/uL Final   Lymphocytes Relative 10/14/2022 22  % Final   Lymphs Abs 10/14/2022 1.1  0.7 - 4.0 K/uL Final   Monocytes Relative 10/14/2022 8  % Final   Monocytes Absolute 10/14/2022 0.4  0.1 - 1.0 K/uL Final   Eosinophils Relative 10/14/2022 1  % Final   Eosinophils Absolute 10/14/2022 0.1  0.0 - 0.5 K/uL Final   Basophils Relative 10/14/2022 1  % Final   Basophils Absolute 10/14/2022 0.1  0.0 - 0.1 K/uL Final   Immature Granulocytes 10/14/2022 0  % Final   Abs Immature Granulocytes 10/14/2022 0.02  0.00 - 0.07 K/uL Final   Performed at [facility], [address], [place], [place] [address]   Sodium 10/14/2022 139  135 - 145 mmol/L Final   Potassium 10/14/2022 3.9  3.5 - 5.1 mmol/L Final   Chloride 10/14/2022 103  98 - 111 mmol/L Final   CO2 10/14/2022 26  22 - 32 mmol/L Final   Glucose, Bld 10/14/2022 104 (H)  70 - 99 mg/dL Final   Glucose reference range applies only to samples  taken after fasting for at least 8 hours.   BUN 10/14/2022 8  6 - 20 mg/dL Final   Creatinine, Ser 10/14/2022 0.77  0.61 - 1.24 mg/dL Final   Calcium 52/84/1324 9.2  8.9 - 10.3 mg/dL Final   Total Protein 40/10/2723 7.8  6.5 - 8.1 g/dL Final   Albumin 36/64/4034 3.4 (L)  3.5 - 5.0 g/dL Final   AST 74/25/9563 99 (H)  15 - 41 U/L Final   ALT 10/14/2022 36  0 - 44 U/L Final   Alkaline Phosphatase 10/14/2022 236 (H)  38 - 126 U/L Final   Total Bilirubin 10/14/2022 2.5 (H)  0.3 - 1.2 mg/dL Final   GFR, Estimated 10/14/2022 >60  >60 mL/min Final   Comment: (NOTE) Calculated using the CKD-EPI Creatinine Equation (2021)    Anion gap 10/14/2022 10  5 - 15 Final   Performed at [facility], [address], [place], [place] [address]   Hgb A1c MFr Bld 10/14/2022 4.1 (L)  4.8 - 5.6 % Final   Comment: (NOTE) Pre diabetes:          5.7%-6.4%  Diabetes:              >6.4%  Glycemic control for   <7.0% adults with diabetes    Mean Plasma Glucose 10/14/2022 70.97  mg/dL Final   Performed at [facility], [address] [place], [place] [address]   Magnesium 10/14/2022 2.0  1.7 - 2.4 mg/dL Final   Performed at [facility], [address], [place], [place] [address]   Alcohol, Ethyl (B) 10/14/2022 <10  <10 mg/dL Final   Comment: (NOTE) Lowest detectable limit for serum alcohol is 10 mg/dL.  For  medical purposes only. Performed at [facility], [address] [place], [place] [address]    RPR Ser Ql 10/14/2022 NON REACTIVE  NON REACTIVE Final   Performed at [facility], [address] [place], [place] [address]   Color, Urine 10/14/2022 AMBER (A)  YELLOW Final   BIOCHEMICALS MAY BE AFFECTED BY COLOR   APPearance 10/14/2022 CLEAR  CLEAR Final   Specific Gravity, Urine 10/14/2022 1.017  1.005 - 1.030 Final   pH 10/14/2022 6.0  5.0 - 8.0 Final   Glucose, UA 10/14/2022 NEGATIVE  NEGATIVE mg/dL Final   Hgb urine dipstick 10/14/2022 NEGATIVE  NEGATIVE Final    Bilirubin Urine 10/14/2022 NEGATIVE  NEGATIVE Final   Ketones, ur 10/14/2022 NEGATIVE  NEGATIVE mg/dL Final   Protein, ur 86/57/8469 NEGATIVE  NEGATIVE mg/dL Final   Nitrite 62/95/2841 NEGATIVE  NEGATIVE Final   Leukocytes,Ua 10/14/2022 NEGATIVE  NEGATIVE Final   Performed at [facility], [address] [place], [place] [address]   POC Amphetamine UR 10/14/2022 None Detected  NONE DETECTED (Cut Off Level 1000 ng/mL) Final   POC Secobarbital (BAR) 10/14/2022 None Detected  NONE DETECTED (Cut Off Level 300 ng/mL) Final   POC Buprenorphine (BUP) 10/14/2022 None Detected  NONE DETECTED (Cut Off Level 10 ng/mL) Final   POC Oxazepam (BZO) 10/14/2022 Positive (A)  NONE DETECTED (Cut Off Level 300 ng/mL) Final   POC Cocaine UR 10/14/2022 None Detected  NONE DETECTED (Cut Off Level 300 ng/mL) Final   POC Methamphetamine UR 10/14/2022 None Detected  NONE DETECTED (Cut Off Level 1000 ng/mL) Final   POC Morphine 10/14/2022 None Detected  NONE DETECTED (Cut Off Level 300 ng/mL) Final   POC Methadone UR 10/14/2022 None Detected  NONE DETECTED (Cut Off Level 300 ng/mL) Final   POC Oxycodone UR 10/14/2022 None Detected  NONE DETECTED (Cut Off Level 100 ng/mL) Final   POC Marijuana UR 10/14/2022 None Detected  NONE DETECTED (Cut Off Level 50 ng/mL) Final   Cholesterol 10/14/2022 275 (H)  0 - 200 mg/dL Final   Triglycerides 11/07/2534 255 (H)  <150 mg/dL Final   HDL 64/40/3474 51  >40 mg/dL Final   Total CHOL/HDL Ratio 10/14/2022 5.4  RATIO Final   VLDL 10/14/2022 51 (H)  0 - 40 mg/dL Final   LDL Cholesterol 10/14/2022 173 (H)  0 - 99 mg/dL Final   Comment:        Total Cholesterol/HDL:CHD Risk Coronary Heart Disease Risk Table                     Men   Women  1/2 Average Risk   3.4   3.3  Average Risk       5.0   4.4  2 X Average Risk   9.6   7.1  3 X Average Risk  23.4   11.0        Use the calculated Patient Ratio above and the CHD Risk Table to determine the patient's CHD Risk.         ATP III CLASSIFICATION (LDL):  <100     mg/dL   Optimal  259-563  mg/dL   Near or Above                    Optimal  130-159  mg/dL   Borderline  875-643  mg/dL   High  >329     mg/dL   Very High Performed at [facility],  [address] [place], [place] [address]    TSH 10/14/2022 155.020 (H)  0.350 - 4.500 uIU/mL Final   Comment: Performed by a 3rd Generation assay with a functional sensitivity of <=0.01 uIU/mL. Performed at [facility], [address], [place], [place] [address]     Blood Alcohol level:  Lab Results  Component Value Date   ETH <10 10/14/2022   ETH 53 (H) 03/09/2022    Metabolic Disorder Labs: Lab Results  Component Value Date   HGBA1C 4.1 (L) 10/14/2022   MPG 70.97 10/14/2022   MPG 100 03/09/2022   Lab Results  Component Value Date   PROLACTIN 10.0 03/09/2022   Lab Results  Component Value Date   CHOL 275 (H) 10/14/2022   TRIG 255 (H) 10/14/2022   HDL 51 10/14/2022   CHOLHDL 5.4 10/14/2022   VLDL 51 (H) 10/14/2022   LDLCALC 173 (H) 10/14/2022   LDLCALC UNABLE TO CALCULATE IF TRIGLYCERIDE OVER 400 mg/dL 34/74/2595    Therapeutic Lab Levels: No results found for: "LITHIUM" No results found for: "VALPROATE" No results found for: "CBMZ"  Physical Findings   AIMS    Flowsheet Row [facility]  AIMS Total Score 0      AUDIT    Flowsheet Row Ad[facility] ([facility]  Alcohol Use Disorder Identification Test Final Score (AUDIT) 13      GAD-7    Flowsheet Row Office Visit from 01/22/2022 in [facility]  Total GAD-7 Score 1      PHQ2-9    Flowsheet Row ED from 10/14/2022 in [facility] Most recent reading at 10/15/2022 12:54 PM ED from 10/14/2022 in [facility] Most recent reading at 10/14/2022   3:19 PM Office Visit from 01/22/2022 in [facility] Most recent reading at 01/22/2022 10:00 AM ED from 09/13/2021 in [facility] Most recent reading at 09/19/2021 10:07 AM Office Visit from 05/18/2021 in [facility] Most recent reading at 05/18/2021 10:41 AM  PHQ-2 Total Score 2 2 0 1 0  PHQ-9 Total Score 6 10 1 6 3       Flowsheet Row ED from 10/14/2022 in [facility] Most recent reading at 10/14/2022  9:56 PM ED from 10/14/2022 in [facility] Most recent reading at 10/14/2022  4:58 PM ED from 03/09/2022 in [facility] Most recent reading at 03/09/2022  4:17 PM  C-SSRS RISK CATEGORY No Risk Error: Q3, 4, or 5 should not be populated when Q2 is No No Risk        Musculoskeletal  Strength & Muscle Tone: within normal limits Gait & Station: normal Patient leans: N/A  Psychiatric Specialty Exam  Presentation  General Appearance:  Appropriate for Environment; Casual; Fairly Groomed  Eye Contact: Fair  Speech: Clear and Coherent; Normal Rate  Speech Volume: Normal  Handedness: Right   Mood and Affect  Mood: Euthymic  Affect: Appropriate; Congruent; Full Range   Thought Process  Thought Processes: Coherent; Goal Directed; Linear  Descriptions of Associations:Intact  Orientation:Full (Time, Place and Person)  Thought Content:Logical; WDL  Diagnosis of Schizophrenia or Schizoaffective disorder in past: No    Hallucinations:Hallucinations: None  Ideas of Reference:None  Suicidal Thoughts:Suicidal Thoughts: No  Homicidal Thoughts:Homicidal Thoughts: No  Sensorium  Memory: Immediate Good  Judgment: Poor  Insight: Shallow   Executive Functions  Concentration: Good  Attention Span: Good  Recall: Good  Fund of Knowledge: Good  Language: Good   Psychomotor Activity   Psychomotor Activity: Psychomotor Activity: Psychomotor Retardation    Assets  Assets: Communication Skills; Desire for Improvement; Resilience   Sleep  Sleep: Sleep: Fair Number of Hours of Sleep: 6   Nutritional Assessment (For OBS and FBC admissions only) Has the patient had a weight loss or gain of 10 pounds or more in the last 3 months?: No Has the patient had a decrease in food intake/or appetite?: No Does the patient have dental problems?: No Does the patient have eating habits or behaviors that may be indicators of an eating disorder including binging or inducing vomiting?: No Has the patient recently lost weight without trying?: 0 Has the patient been eating poorly because of a decreased appetite?: 0 Malnutrition Screening Tool Score: 0    Physical Exam  Physical Exam Vitals and nursing note reviewed.  HENT:     Head: Normocephalic and atraumatic.  Pulmonary:     Effort: Pulmonary effort is normal. No respiratory distress.  Neurological:     General: No focal deficit present.     Mental Status: Danny West is alert and oriented to person, place, and time.    Review of Systems  Constitutional:  Positive for malaise/fatigue.  Respiratory:  Negative for shortness of breath.   Cardiovascular:  Negative for chest pain.  Gastrointestinal:  Negative for abdominal pain, constipation, diarrhea, nausea and vomiting.  Musculoskeletal:  Negative for myalgias.  Neurological:  Negative for dizziness, tremors and headaches.   Blood pressure 123/83, pulse 79, temperature 97.8 F (36.6 C), temperature source Oral, resp. rate 18, SpO2 100%. There is no height or weight on file to calculate BMI.  Treatment Plan Summary: Daily contact with patient to assess and evaluate symptoms and progress in treatment and Medication management  AUD, severe (no sz or DT)  Mildly elevated AST/ALT Interested in residential rehab. No w/d sxs currently. CIWA with ativan PRN per protocol with  thiamine & MV supplement Continued ativan taper (ends 10/18/2022) Declined naltrexone F/U CMP  Hypothyroidism  Moderate hypertriglyceridemia TSH 155, FT4 0.38 Trig 255 Non adherent to rx, restarted home rx. Has elevated trig 2/2 hypothyroidism uncontrolled. Does not meet criteria for statin, fibrate, omega 3 at this time per ASCVD, REDUCE-IT. Expect trig to improve with synthroid rx adherence. Continued home synthroid 200 mcg F/U fasting lipid panel  Microcytic anemia Mildly anemic. F/U CBC, B12, folate, iron panel, ferritin  MDD  GAD Currently euthymic Continued home zoloft 100 mg daily  Nicotine use d/o Precontemplative.  NRT   Follow-up recs: PCP to repeat CMP, CBC, TSH, FT4, Lipid panel  Princess Bruins, DO 10/16/2022 3:31 PM

## 2022-10-16 NOTE — ED Notes (Signed)
Patient in the Dayroom watching TV with other patients. Denies SI/HI &AVH. NAD. Respirations are even and unlabored. Will continue to monitor for safety.

## 2022-10-16 NOTE — ED Notes (Signed)
Provider states patient is po after dinner until he gets moirng labs.Rn notified mht to tell on coming shift mht and rn will tell on coming rn.

## 2022-10-16 NOTE — ED Notes (Signed)
 Pt was provided lunch

## 2022-10-16 NOTE — ED Notes (Signed)
Patient alert and oriented x 3. Denies SI/HI/AVH. Denies intent or plan to harm self or others. Routine conducted according to faculty protocol. Encourage patient to notify staff with any needs or concerns. Patient verbalized agreement and understanding. Will continue to monitor for safety. 

## 2022-10-16 NOTE — ED Notes (Signed)
Pt does not get anything to eat after dinner

## 2022-10-16 NOTE — Group Note (Signed)
Group Topic: Relaxation  Group Date: 10/16/2022 Start Time: 1130 End Time: 1210 Facilitators: Londell Moh, NT  Department: [facility]  Number of Participants: 7  Group Focus: check in, daily focus, and relaxation Treatment Modality:  Psychoeducation Interventions utilized were leisure development and patient education Purpose: increase insight  Name: Danny West Date of Birth: 1975-04-19  MR: [id number]    Level of Participation: active Quality of Participation: engaged Interactions with others: gave feedback Mood/Affect: appropriate Triggers (if applicable): n/a Cognition: coherent/clear Progress: Gaining insight Response: Pt was active during group and he reflected on his goals for the day Plan: patient will be encouraged to continue to attend groups  Patients Problems:  Patient Active Problem List   Diagnosis Date Noted   Alcohol abuse 10/14/2022   Alcohol-induced mood disorder (HCC) 03/09/2022   H/O suicide attempt 09/14/2021   Alcohol use disorder, severe, dependence (HCC) 09/13/2021   Snuff user 03/05/2021   Mild intermittent asthma without complication 03/05/2021   Acquired hypothyroidism 03/05/2021   Moderate major depression (HCC) 03/05/2021   MDD (major depressive disorder), recurrent severe, without psychosis (HCC) 01/20/2021   Cellulitis of left lower extremity 01/20/2021

## 2022-10-16 NOTE — ED Notes (Signed)
Patient  sleeping in no acute stress. RR even and unlabored .Environment secured .Will continue to monitor for safely. 

## 2022-10-16 NOTE — ED Notes (Signed)
Patient states that he has mild aniexty. Rn offered medication . Patient refused. Advised patient if he changes his mild to let rn know

## 2022-10-17 DIAGNOSIS — F331 Major depressive disorder, recurrent, moderate: Secondary | ICD-10-CM | POA: Diagnosis not present

## 2022-10-17 DIAGNOSIS — D539 Nutritional anemia, unspecified: Secondary | ICD-10-CM | POA: Diagnosis not present

## 2022-10-17 DIAGNOSIS — E785 Hyperlipidemia, unspecified: Secondary | ICD-10-CM | POA: Diagnosis not present

## 2022-10-17 DIAGNOSIS — E039 Hypothyroidism, unspecified: Secondary | ICD-10-CM | POA: Diagnosis not present

## 2022-10-17 LAB — COMPREHENSIVE METABOLIC PANEL
ALT: 25 U/L (ref 0–44)
AST: 50 U/L — ABNORMAL HIGH (ref 15–41)
Albumin: 2.9 g/dL — ABNORMAL LOW (ref 3.5–5.0)
Alkaline Phosphatase: 153 U/L — ABNORMAL HIGH (ref 38–126)
Anion gap: 9 (ref 5–15)
BUN: 10 mg/dL (ref 6–20)
CO2: 28 mmol/L (ref 22–32)
Calcium: 8.9 mg/dL (ref 8.9–10.3)
Chloride: 101 mmol/L (ref 98–111)
Creatinine, Ser: 0.92 mg/dL (ref 0.61–1.24)
GFR, Estimated: >60 mL/min (ref >60)
Glucose, Bld: 94 mg/dL (ref 70–99)
Potassium: 3.3 mmol/L — ABNORMAL LOW (ref 3.5–5.1)
Sodium: 138 mmol/L (ref 135–145)
Total Bilirubin: 1.9 mg/dL — ABNORMAL HIGH (ref 0.3–1.2)
Total Protein: 6.6 g/dL (ref 6.5–8.1)

## 2022-10-17 LAB — CBC WITH DIFFERENTIAL/PLATELET
Abs Immature Granulocytes: 0.01 10*3/uL (ref 0.00–0.07)
Basophils Absolute: 0 10*3/uL (ref 0.0–0.1)
Basophils Relative: 1 %
Eosinophils Absolute: 0.1 10*3/uL (ref 0.0–0.5)
Eosinophils Relative: 3 %
HCT: 31.4 % — ABNORMAL LOW (ref 39.0–52.0)
Hemoglobin: 10.7 g/dL — ABNORMAL LOW (ref 13.0–17.0)
Immature Granulocytes: 0 %
Lymphocytes Relative: 33 %
Lymphs Abs: 1.2 10*3/uL (ref 0.7–4.0)
MCH: 35.8 pg — ABNORMAL HIGH (ref 26.0–34.0)
MCHC: 34.1 g/dL (ref 30.0–36.0)
MCV: 105 fL — ABNORMAL HIGH (ref 80.0–100.0)
Monocytes Absolute: 0.4 10*3/uL (ref 0.1–1.0)
Monocytes Relative: 10 %
Neutro Abs: 2 10*3/uL (ref 1.7–7.7)
Neutrophils Relative %: 53 %
Platelets: 91 10*3/uL — ABNORMAL LOW (ref 150–400)
RBC: 2.99 MIL/uL — ABNORMAL LOW (ref 4.22–5.81)
RDW: 15.7 % — ABNORMAL HIGH (ref 11.5–15.5)
WBC: 3.7 10*3/uL — ABNORMAL LOW (ref 4.0–10.5)
nRBC: 0 % (ref 0.0–0.2)

## 2022-10-17 LAB — LIPID PANEL
Cholesterol: 220 mg/dL — ABNORMAL HIGH (ref 0–200)
HDL: 58 mg/dL (ref >40)
LDL Cholesterol: 147 mg/dL — ABNORMAL HIGH (ref 0–99)
Total CHOL/HDL Ratio: 3.8 {ratio}
Triglycerides: 77 mg/dL (ref <150)
VLDL: 15 mg/dL (ref 0–40)

## 2022-10-17 LAB — FOLATE: Folate: 12.8 ng/mL (ref >5.9)

## 2022-10-17 LAB — IRON AND TIBC
Iron: 64 ug/dL (ref 45–182)
Saturation Ratios: 16 % — ABNORMAL LOW (ref 17.9–39.5)
TIBC: 402 ug/dL (ref 250–450)
UIBC: 338 ug/dL

## 2022-10-17 LAB — T4, FREE: Free T4: 0.49 ng/dL — ABNORMAL LOW (ref 0.61–1.12)

## 2022-10-17 LAB — T3, FREE: T3, Free: 1.7 pg/mL — ABNORMAL LOW (ref 2.0–4.4)

## 2022-10-17 LAB — FERRITIN: Ferritin: 200 ng/mL (ref 24–336)

## 2022-10-17 LAB — VITAMIN B12: Vitamin B-12: 420 pg/mL (ref 180–914)

## 2022-10-17 MED ORDER — IBUPROFEN 200 MG PO TABS: 200.0000 mg | ORAL_TABLET | Freq: Four times a day (QID) | ORAL | Status: DC | PRN

## 2022-10-17 NOTE — ED Provider Notes (Signed)
Behavioral Health Progress Note  Date and Time: 10/17/2022 2:43 PM Name: Danny West MRN:  [id number]  Subjective:  Danny West is a 46 y.o. adult with PMH MDD, GAD, alcohol use d/o (no w/d hx), stimulant use d/o, hypothyroidism (on synthroid, non-adherent), suicide attempt (09/2021), inpatient psych admission, who was admitted to [facility] (10/14/2022) secondary to alcohol abuse problem and seeking treatment.  He reported that he relapsed back in May when he found his dad on the floor dead. Last 11/29/22, patient went to a family party and "everyone was drinking and drank". He reported feeling guilty and remorseful and tried to detox himself but would get very sick each time he tried. Patient saw his substance abuse counselor and was advise to seek professional treatment.  Patient presented with increased depression and  reported withdrawal symptoms including tremors, sweating, malaise and runny nose. He reported that he wants to regain sobriety through a long term treatment program. He was admitted and detox protocol initiated.   Reported feeling better, denied depression or anxiety. Energy improving. Sleep was good last night. Appetite appropriate. Denied craving. Denied active and passive SI, HI, AVH, paranoia.   Discussed at length about suspected liver cirrhosis/disease due to lab values and physical exam on mild jaundice. Patient confirmed that his roommate noticed that his skin tone was off about a week ago.  Instructed patient that he needs to see his PCP and be referred to liver doc. He verbalized understanding. Had no other questions.   Diagnosis:  Final diagnoses:  None      Total Time spent with patient: 20 minutes  Past Psychiatric History:  Dx: MDD, GAD, alcohol use d/o (no w/d hx), stimulant use d/o (vyvanse, adderal), self-reported ADHD Prior Rx: naltrexone 50 mg daily. Prozac in 1998, and Zoloft from 2019-January of 2022. Cytomel 25 mg daily Current OP psychiatrist: Denied Prior OP  psychiatrist: Unsure Current OP therapist: [facility] at [facility] [place] PCP: Denied Suicide Attempt: Yes, June 2023 - OD on handful of zoloft and naltrexone, wrote on his white board at his apartment that if he wakes up the next day, he needs to live for his children, did not seek medical attention or told therapist.  Inpatient psych: 01/24/2021 [facility] Violence/Aggression: Denied  Substance use EtOH: Yes. Twisted tea x1 in AM & 14oz beer x4 after work. 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[employer], Health and safety inspector Social History:                          Current Medications:  Current Facility-Administered Medications  Medication Dose Route Frequency Provider Last Rate Last Admin   alum & mag hydroxide-simeth (MAALOX/MYLANTA) 200-200-20 MG/5ML suspension 30 mL  30 mL Oral Q4H PRN Onuoha, Chinwendu V, NP       hydrOXYzine (ATARAX) tablet 25 mg  25 mg Oral Q6H PRN Onuoha, Chinwendu V, NP       ibuprofen (ADVIL) tablet 200 mg  200 mg  Oral Q6H PRN Princess Bruins, DO       levothyroxine (SYNTHROID) tablet 200 mcg  200 mcg Oral Q0600 Onuoha, Chinwendu V, NP   200 mcg at 10/17/22 0646   loperamide (IMODIUM) capsule 2-4 mg  2-4 mg Oral PRN Onuoha, Chinwendu V, NP       LORazepam (ATIVAN) tablet 1 mg  1 mg Oral Q6H PRN Onuoha, Chinwendu V, NP       LORazepam (ATIVAN) tablet 1 mg  1 mg Oral BID Princess Bruins, DO   1  mg at 10/17/22 1610   magnesium hydroxide (MILK OF MAGNESIA) suspension 30 mL  30 mL Oral Daily PRN Onuoha, Chinwendu V, NP       multivitamin with minerals tablet 1 tablet  1 tablet Oral Daily Onuoha, Chinwendu V, NP   1 tablet at 10/17/22 0943   nicotine (NICODERM CQ - dosed in mg/24 hours) patch 21 mg  21 mg Transdermal Daily Rayburn Go, Veronique M, NP   21 mg at 10/17/22 0944   ondansetron (ZOFRAN-ODT) disintegrating tablet 4 mg  4 mg Oral Q6H PRN Onuoha, Chinwendu V, NP       sertraline (ZOLOFT) tablet 100 mg  100 mg Oral Daily Onuoha, Chinwendu V, NP   100 mg at 10/17/22 0944   thiamine (VITAMIN B1) injection 100 mg  100 mg Intramuscular Once Onuoha, Chinwendu V, NP       thiamine (VITAMIN B1) tablet 100 mg  100 mg Oral Daily Onuoha, Chinwendu V, NP   100 mg at 10/17/22 0943   traZODone (DESYREL) tablet 50 mg  50 mg Oral QHS PRN Onuoha, Chinwendu V, NP   50 mg at 10/16/22 2111   Current Outpatient Medications  Medication Sig Dispense Refill   albuterol (VENTOLIN HFA) 108 (90 Base) MCG/ACT inhaler Inhale 2 puffs into the lungs every 6 (six) hours as needed for wheezing or shortness of breath.     Ascorbic Acid (VITAMIN C ADULT GUMMIES PO) Take 2 tablets by mouth daily.     ibuprofen (ADVIL) 200 MG tablet Take 400 mg by mouth every 6 (six) hours as needed (For pain).     levothyroxine (SYNTHROID) 200 MCG tablet Take 1 tablet (200 mcg total) by mouth daily before breakfast. 30 tablet 4   Omega-3 Fatty Acids (OMEGA 3 PO) Take 1 capsule by mouth daily.     sertraline (ZOLOFT) 100 MG tablet Take 1 tablet (100 mg total) by mouth daily.     triamcinolone ointment (KENALOG) 0.1 % Apply 1 Application topically 2 (two) times daily as needed (Apply to affected area).      Labs  Lab Results:  Admission on 10/14/2022  Component Date Value Ref Range Status   Free T4 10/17/2022 0.49 (L)  0.61 - 1.12 ng/dL Final   Comment: (NOTE) Biotin ingestion may interfere with free T4 tests. If the results  are inconsistent with the TSH level, previous test results, or the clinical presentation, then consider biotin interference. If needed, order repeat testing after stopping biotin. Performed at [facility] Lab, [address] [place], [place] [address]    T3, Free 10/14/2022 1.7 (L)  2.0 - 4.4 pg/mL Final   Comment: (NOTE) Performed At: [facility] [address] [place], [place] [id number] Jolene Schimke MD NW:[id number]    Free T4 10/14/2022 0.38 (L)  0.61 - 1.12 ng/dL Final   Comment: (NOTE) Biotin ingestion may interfere with free T4 tests. If the results are inconsistent with the TSH level, previous test  results, or the clinical presentation, then consider biotin interference. If needed, order repeat testing after stopping biotin. Performed at [facility] Lab, [address] [place], [place] [address]    WBC 10/17/2022 3.7 (L)  4.0 - 10.5 K/uL Final   RBC 10/17/2022 2.99 (L)  4.22 - 5.81 MIL/uL Final   Hemoglobin 10/17/2022 10.7 (L)  13.0 - 17.0 g/dL Final   HCT 60/45/4098 31.4 (L)  39.0 - 52.0 % Final   MCV 10/17/2022 105.0 (H)  80.0 - 100.0 fL Final   MCH 10/17/2022 35.8 (H)  26.0 - 34.0 pg Final   MCHC 10/17/2022 34.1  30.0 - 36.0 g/dL Final   RDW 11/91/4782 15.7 (H)  11.5 - 15.5 % Final   Platelets 10/17/2022 91 (L)  150 - 400 K/uL Final   Comment: Immature Platelet Fraction may be clinically indicated, consider ordering this additional test [id number] REPEATED TO VERIFY PLATELET COUNT CONFIRMED BY SMEAR    nRBC 10/17/2022 0.0  0.0 - 0.2 % Final   Neutrophils Relative % 10/17/2022 53  % Final   Neutro Abs 10/17/2022 2.0  1.7 - 7.7 K/uL Final   Lymphocytes Relative 10/17/2022 33  % Final   Lymphs Abs 10/17/2022 1.2  0.7 - 4.0 K/uL Final   Monocytes Relative 10/17/2022 10  % Final   Monocytes Absolute 10/17/2022 0.4  0.1 - 1.0 K/uL Final   Eosinophils Relative 10/17/2022 3  % Final   Eosinophils Absolute 10/17/2022 0.1  0.0 - 0.5 K/uL Final    Basophils Relative 10/17/2022 1  % Final   Basophils Absolute 10/17/2022 0.0  0.0 - 0.1 K/uL Final   Immature Granulocytes 10/17/2022 0  % Final   Abs Immature Granulocytes 10/17/2022 0.01  0.00 - 0.07 K/uL Final   Performed at [facility], [address], [place], [place] [address]   Cholesterol 10/17/2022 220 (H)  0 - 200 mg/dL Final   Triglycerides 69/62/9528 77  <150 mg/dL Final   HDL 41/32/4401 58  >40 mg/dL Final   Total CHOL/HDL Ratio 10/17/2022 3.8  RATIO Final   VLDL 10/17/2022 15  0 - 40 mg/dL Final   LDL Cholesterol 10/17/2022 147 (H)  0 - 99 mg/dL Final   Comment:        Total Cholesterol/HDL:CHD Risk Coronary Heart Disease Risk Table                     Men   Women  1/2 Average Risk   3.4   3.3  Average Risk       5.0   4.4  2 X Average Risk   9.6   7.1  3 X Average Risk  23.4   11.0        Use the calculated Patient Ratio above and the CHD Risk Table to determine the patient's CHD Risk.        ATP III CLASSIFICATION (LDL):  <100     mg/dL   Optimal  027-253  mg/dL   Near or Above                    Optimal  130-159  mg/dL   Borderline  664-403  mg/dL   High  >474     mg/dL   Very High Performed at [facility], [address] [place], [place] [address]    Vitamin B-12 10/17/2022 420  180 - 914 pg/mL Final   Comment: (NOTE) This assay is  not validated for testing neonatal or myeloproliferative syndrome specimens for Vitamin B12 levels. Performed at [facility], [address], [place], [place] [address]    Folate 10/17/2022 12.8  >5.9 ng/mL Final   Performed at [facility], [address], [place], [place] [address]   Iron 10/17/2022 64  45 - 182 ug/dL Final   TIBC 09/81/1916 402  250 - 450 ug/dL Final   Saturation Ratios 10/17/2022 16 (L)  17.9 - 39.5 % Final   UIBC 10/17/2022 338  ug/dL Final   Performed at [facility], [address] [place], [place] [address]   Ferritin 10/17/2022 200  24 - 336 ng/mL Final    Performed at [facility], [address], [place], [place] [address]   Sodium 10/17/2022 138  135 - 145 mmol/L Final   Potassium 10/17/2022 3.3 (L)  3.5 - 5.1 mmol/L Final   Chloride 10/17/2022 101  98 - 111 mmol/L Final   CO2 10/17/2022 28  22 - 32 mmol/L Final   Glucose, Bld 10/17/2022 94  70 - 99 mg/dL Final   Glucose reference range applies only to samples taken after fasting for at least 8 hours.   BUN 10/17/2022 10  6 - 20 mg/dL Final   Creatinine, Ser 10/17/2022 0.92  0.61 - 1.24 mg/dL Final   Calcium 86/57/8469 8.9  8.9 - 10.3 mg/dL Final   Total Protein 62/95/2841 6.6  6.5 - 8.1 g/dL Final   Albumin 32/44/0102 2.9 (L)  3.5 - 5.0 g/dL Final   AST 72/53/6644 50 (H)  15 - 41 U/L Final   ALT 10/17/2022 25  0 - 44 U/L Final   Alkaline Phosphatase 10/17/2022 153 (H)  38 - 126 U/L Final   Total Bilirubin 10/17/2022 1.9 (H)  0.3 - 1.2 mg/dL Final   GFR, Estimated 10/17/2022 >60  >60 mL/min Final   Comment: (NOTE) Calculated using the CKD-EPI Creatinine Equation (2021)    Anion gap 10/17/2022 9  5 - 15 Final   Performed at [facility], [address], [place], [place] [address]  Admission on 10/14/2022, Discharged on 10/14/2022  Component Date Value Ref Range Status   WBC 10/14/2022 4.9  4.0 - 10.5 K/uL Final   RBC 10/14/2022 3.10 (L)  4.22 - 5.81 MIL/uL Final   Hemoglobin 10/14/2022 11.1 (L)  13.0 - 17.0 g/dL Final   HCT 25/95/6387 32.5 (L)  39.0 - 52.0 % Final   MCV 10/14/2022 104.8 (H)  80.0 - 100.0 fL Final   MCH 10/14/2022 35.8 (H)  26.0 - 34.0 pg Final   MCHC 10/14/2022 34.2  30.0 - 36.0 g/dL Final   RDW 56/43/3295 16.3 (H)  11.5 - 15.5 % Final   Platelets 10/14/2022 91 (L)  150 - 400 K/uL Final   Comment: SPECIMEN CHECKED FOR CLOTS Immature Platelet Fraction may be clinically indicated, consider ordering this additional test [id number] REPEATED TO VERIFY    nRBC 10/14/2022 0.0  0.0 - 0.2 % Final   Neutrophils Relative % 10/14/2022 68  % Final   Neutro Abs  10/14/2022 3.3  1.7 - 7.7 K/uL Final   Lymphocytes Relative 10/14/2022 22  % Final   Lymphs Abs 10/14/2022 1.1  0.7 - 4.0 K/uL Final   Monocytes Relative 10/14/2022 8  % Final   Monocytes Absolute 10/14/2022 0.4  0.1 - 1.0 K/uL Final   Eosinophils Relative 10/14/2022 1  % Final   Eosinophils Absolute 10/14/2022 0.1  0.0 - 0.5 K/uL Final   Basophils Relative 10/14/2022 1  % Final   Basophils Absolute 10/14/2022 0.1  0.0 - 0.1 K/uL Final   Immature Granulocytes 10/14/2022 0  % Final   Abs Immature Granulocytes 10/14/2022 0.02  0.00 - 0.07 K/uL Final   Performed at [facility], [address] [place], [place] [address]   Sodium 10/14/2022 139  135 - 145 mmol/L Final   Potassium 10/14/2022 3.9  3.5 - 5.1 mmol/L Final   Chloride 10/14/2022 103  98 - 111 mmol/L Final   CO2 10/14/2022 26  22 - 32 mmol/L Final   Glucose, Bld 10/14/2022 104 (H)  70 - 99 mg/dL Final   Glucose reference range applies only to samples taken after fasting for at least 8 hours.   BUN 10/14/2022 8  6 - 20 mg/dL Final   Creatinine, Ser 10/14/2022 0.77  0.61 - 1.24 mg/dL Final   Calcium 60/45/4098 9.2  8.9 - 10.3 mg/dL Final   Total Protein 11/91/4782 7.8  6.5 - 8.1 g/dL Final   Albumin 95/62/1308 3.4 (L)  3.5 - 5.0 g/dL Final   AST 65/78/4696 99 (H)  15 - 41 U/L Final   ALT 10/14/2022 36  0 - 44 U/L Final   Alkaline Phosphatase 10/14/2022 236 (H)  38 - 126 U/L Final   Total Bilirubin 10/14/2022 2.5 (H)  0.3 - 1.2 mg/dL Final   GFR, Estimated 10/14/2022 >60  >60 mL/min Final   Comment: (NOTE) Calculated using the CKD-EPI Creatinine Equation (2021)    Anion gap 10/14/2022 10  5 - 15 Final   Performed at [facility], [address], [place], [place] [address]   Hgb A1c MFr Bld 10/14/2022 4.1 (L)  4.8 - 5.6 % Final   Comment: (NOTE) Pre diabetes:          5.7%-6.4%  Diabetes:              >6.4%  Glycemic control for   <7.0% adults with diabetes    Mean Plasma Glucose 10/14/2022 70.97  mg/dL Final    Performed at [facility], [address], [place], [place] [address]   Magnesium 10/14/2022 2.0  1.7 - 2.4 mg/dL Final   Performed at [facility], [address] [place], [place] [address]   Alcohol, Ethyl (B) 10/14/2022 <10  <10 mg/dL Final   Comment: (NOTE) Lowest detectable limit for serum alcohol is 10 mg/dL.  For medical purposes only. Performed at [facility], [address], [place], [place] [address]    RPR Ser Ql 10/14/2022 NON REACTIVE  NON REACTIVE Final   Performed at [facility], [address], [place], [place] [address]   Color, Urine 10/14/2022 AMBER (A)  YELLOW Final   BIOCHEMICALS MAY BE AFFECTED BY COLOR   APPearance 10/14/2022 CLEAR  CLEAR Final   Specific Gravity, Urine 10/14/2022 1.017  1.005 - 1.030 Final   pH 10/14/2022 6.0  5.0 - 8.0 Final   Glucose, UA 10/14/2022 NEGATIVE  NEGATIVE mg/dL Final   Hgb urine dipstick 10/14/2022 NEGATIVE  NEGATIVE Final   Bilirubin Urine 10/14/2022 NEGATIVE  NEGATIVE Final   Ketones, ur 10/14/2022 NEGATIVE  NEGATIVE mg/dL Final   Protein, ur 47/42/5956 NEGATIVE  NEGATIVE mg/dL Final   Nitrite 38/75/6433 NEGATIVE  NEGATIVE Final   Leukocytes,Ua 10/14/2022 NEGATIVE  NEGATIVE Final   Performed at [facility], [address], [place], [place] [address]  POC Amphetamine UR 10/14/2022 None Detected  NONE DETECTED (Cut Off Level 1000 ng/mL) Final   POC Secobarbital (BAR) 10/14/2022 None Detected  NONE DETECTED (Cut Off Level 300 ng/mL) Final   POC Buprenorphine (BUP) 10/14/2022 None Detected  NONE DETECTED (Cut Off Level 10 ng/mL) Final   POC Oxazepam (BZO) 10/14/2022 Positive (A)  NONE DETECTED (Cut Off Level 300 ng/mL) Final   POC Cocaine UR 10/14/2022 None Detected  NONE DETECTED (Cut Off Level 300 ng/mL) Final   POC Methamphetamine UR 10/14/2022 None Detected  NONE DETECTED (Cut Off Level 1000 ng/mL) Final   POC Morphine 10/14/2022 None Detected  NONE DETECTED (Cut Off Level 300 ng/mL) Final    POC Methadone UR 10/14/2022 None Detected  NONE DETECTED (Cut Off Level 300 ng/mL) Final   POC Oxycodone UR 10/14/2022 None Detected  NONE DETECTED (Cut Off Level 100 ng/mL) Final   POC Marijuana UR 10/14/2022 None Detected  NONE DETECTED (Cut Off Level 50 ng/mL) Final   Cholesterol 10/14/2022 275 (H)  0 - 200 mg/dL Final   Triglycerides 82/95/6213 255 (H)  <150 mg/dL Final   HDL 08/65/7846 51  >40 mg/dL Final   Total CHOL/HDL Ratio 10/14/2022 5.4  RATIO Final   VLDL 10/14/2022 51 (H)  0 - 40 mg/dL Final   LDL Cholesterol 10/14/2022 173 (H)  0 - 99 mg/dL Final   Comment:        Total Cholesterol/HDL:CHD Risk Coronary Heart Disease Risk Table                     Men   Women  1/2 Average Risk   3.4   3.3  Average Risk       5.0   4.4  2 X Average Risk   9.6   7.1  3 X Average Risk  23.4   11.0        Use the calculated Patient Ratio above and the CHD Risk Table to determine the patient's CHD Risk.        ATP III CLASSIFICATION (LDL):  <100     mg/dL   Optimal  962-952  mg/dL   Near or Above                    Optimal  130-159  mg/dL   Borderline  841-324  mg/dL   High  >401     mg/dL   Very High Performed at [facility], [address], [place], [place] [address]    TSH 10/14/2022 155.020 (H)  0.350 - 4.500 uIU/mL Final   Comment: Performed by a 3rd Generation assay with a functional sensitivity of <=0.01 uIU/mL. Performed at [facility], [address], [place], [place] [address]     Blood Alcohol level:  Lab Results  Component Value Date   ETH <10 10/14/2022   ETH 53 (H) 03/09/2022    Metabolic Disorder Labs: Lab Results  Component Value Date   HGBA1C 4.1 (L) 10/14/2022   MPG 70.97 10/14/2022   MPG 100 03/09/2022   Lab Results  Component Value Date   PROLACTIN 10.0 03/09/2022   Lab Results  Component Value Date   CHOL 220 (H) 10/17/2022   TRIG 77 10/17/2022   HDL 58 10/17/2022   CHOLHDL 3.8 10/17/2022   VLDL 15 10/17/2022   LDLCALC 147  (H) 10/17/2022   LDLCALC 173 (H) 10/14/2022    Therapeutic Lab Levels: No results found for: "LITHIUM" No results  found for: "VALPROATE" No results found for: "CBMZ"  Physical Findings   AIMS    Flowsheet Row [facility]  AIMS Total Score 0      AUDIT    Flowsheet Row Ad[facility] ([facility]  Alcohol Use Disorder Identification Test Final Score (AUDIT) 13      GAD-7    Flowsheet Row Office Visit from 01/22/2022 in [facility]  Total GAD-7 Score 1      PHQ2-9    Flowsheet Row ED from 10/14/2022 in [facility] Most recent reading at 10/15/2022 12:54 PM ED from 10/14/2022 in [facility] Most recent reading at 10/14/2022  3:19 PM Office Visit from 01/22/2022 in [facility] Most recent reading at 01/22/2022 10:00 AM ED from 09/13/2021 in [facility] Most recent reading at 09/19/2021 10:07 AM Office Visit from 05/18/2021 in [facility] Most recent reading at 05/18/2021 10:41 AM  PHQ-2 Total Score 2 2 0 1 0  PHQ-9 Total Score 6 10 1 6 3       Flowsheet Row ED from 10/14/2022 in [facility] Most recent reading at 10/14/2022  9:56 PM ED from 10/14/2022 in [facility] Most recent reading at 10/14/2022  4:58 PM ED from 03/09/2022 in [facility] Most recent reading at 03/09/2022  4:17 PM  C-SSRS RISK CATEGORY No Risk Error: Q3, 4, or 5 should not be populated when Q2 is No No Risk        Musculoskeletal  Strength & Muscle Tone: within normal limits Gait & Station: normal Patient leans: N/A  Psychiatric Specialty Exam  Presentation  General Appearance:  Appropriate for  Environment; Casual; Fairly Groomed  Eye Contact: Fair  Speech: Clear and Coherent; Normal Rate  Speech Volume: Normal  Handedness: Right   Mood and Affect  Mood: Euthymic  Affect: Appropriate; Congruent; Full Range   Thought Process  Thought Processes: Coherent; Goal Directed; Linear  Descriptions of Associations:Intact  Orientation:Full (Time, Place and Person)  Thought Content:Logical; WDL  Diagnosis of Schizophrenia or Schizoaffective disorder in past: No    Hallucinations:Hallucinations: None  Ideas of Reference:None  Suicidal Thoughts:Suicidal Thoughts: No  Homicidal Thoughts:Homicidal Thoughts: No   Sensorium  Memory: Immediate Good  Judgment: Poor  Insight: Shallow   Executive Functions  Concentration: Good  Attention Span: Good  Recall: Good  Fund of Knowledge: Good  Language: Good   Psychomotor Activity  Psychomotor Activity: Psychomotor Activity: Psychomotor Retardation    Assets  Assets: Communication Skills; Desire for Improvement; Resilience   Sleep  Sleep: Sleep: Fair   No data recorded   Physical Exam  Physical Exam Vitals and nursing note reviewed.  Constitutional:      General: Sione is not in acute distress.    Appearance: Wilfred is ill-appearing. Gregg is not toxic-appearing or diaphoretic.  HENT:     Head: Normocephalic and atraumatic.  Eyes:     Conjunctiva/sclera: Conjunctivae normal.  Pulmonary:     Effort: Pulmonary effort is normal. No respiratory distress.  Skin:    Coloration: Skin is jaundiced.  Neurological:     General: No focal deficit present.     Mental Status: Lamon is alert and oriented to person, place, and time.     Gait: Gait  normal.    Review of Systems  Constitutional:  Positive for malaise/fatigue.  Respiratory:  Negative for shortness of breath.   Cardiovascular:  Negative for chest pain.  Gastrointestinal:  Negative for abdominal pain, constipation, diarrhea,  nausea and vomiting.  Musculoskeletal:  Negative for myalgias.  Neurological:  Negative for dizziness, tremors and headaches.   Blood pressure 115/83, pulse 87, temperature 98.1 F (36.7 C), temperature source Oral, resp. rate 18, SpO2 98%. There is no height or weight on file to calculate BMI.  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 No w/d sxs currently. CIWA with ativan PRN per protocol with thiamine & MV supplement Continued ativan taper (ends 10/18/2022) Declined naltrexone  R/O cholangiohepatic dz Mildly elavated AST/ALT downtrending, elevated total bili downtrending. Concern for hepatic function due to low plt, albumin, and faint jaundice on physical exam. Not worsening likely due to EtOH cessation.  Avoid hepatotoxic rx PCP for referral and further workup  Hypothyroidism  Moderate hypertriglyceridemia (RESOLVED) TSH 155, FT4 0.38 Non adherent to rx, restarted home rx. Had elevated trig 2/2 hypothyroidism uncontrolled. Does not meet criteria for statin, fibrate, omega 3 at this time per ASCVD, REDUCE-IT. Expect trig to improve with synthroid rx adherence. Trig wnl on repeat labs Continued home synthroid 200 mcg  Macorytic anemia Was microcytic then macrocytic on repeat lab. Suspect 2/2 hypothyroidism, expect improvement as reaching euthymia. If persists despite euthymia, recommend further workup, consider T3.  B12, folate, iron panel, ferritin wnl except mildly low sat level Synthroid per above  MDD  GAD Currently euthymic Continued home zoloft 100 mg daily  Nicotine use d/o Precontemplative.  NRT   Follow-up recs: PCP to repeat CMP, CBC, TSH, FT4, anemia labs, cholangiohepatic dz workup  Dispo: [facility] - Tuesday  Princess Bruins, DO 10/17/2022 2:43 PM

## 2022-10-17 NOTE — Group Note (Signed)
Group Topic: Communication  Group Date: 10/17/2022 Start Time: 2000 End Time: 2100 Facilitators: Rae Lips B  Department: [facility]  Number of Participants: 5  Group Focus: acceptance, activities of daily living skills, chemical dependency education, chemical dependency issues, clarity of thought, co-dependency, communication, community group, and coping skills Treatment Modality:  Leisure Development Interventions utilized were leisure development Purpose: express feelings  Name: Danny West Date of Birth: Jun 08, 1975  MR: [id number]    Level of Participation: active Quality of Participation: attentive, cooperative, and offered feedback Interactions with others: gave feedback Mood/Affect: appropriate Triggers (if applicable): NA Cognition: coherent/clear Progress: Gaining insight Response: NA Plan: patient will be encouraged to keep going to groups.   Patients Problems:  Patient Active Problem List   Diagnosis Date Noted   Alcohol abuse 10/14/2022   Alcohol-induced mood disorder (HCC) 03/09/2022   H/O suicide attempt 09/14/2021   Alcohol use disorder, severe, dependence (HCC) 09/13/2021   Snuff user 03/05/2021   Mild intermittent asthma without complication 03/05/2021   Acquired hypothyroidism 03/05/2021   Moderate major depression (HCC) 03/05/2021   MDD (major depressive disorder), recurrent severe, without psychosis (HCC) 01/20/2021   Cellulitis of left lower extremity 01/20/2021

## 2022-10-17 NOTE — ED Notes (Signed)
Patient observed/assessed at nursing station. Patient alert and oriented x 4. Affect is flat. Patient denies pain and anxiety. He denies A/V/H. He denies having any thoughts/plan of self harm and harm towards others. Fluid and snack offered. Patient states that appetite has been good throughout the day. Verbalizes no further complaints at this time. Will continue to monitor and support.

## 2022-10-17 NOTE — ED Notes (Signed)
Pt is in the bed resting. Respirations are even and unlabored. No acute distress noted. Will continue to monitor for safety

## 2022-10-17 NOTE — ED Notes (Signed)
Patient observed resting quietly, eyes closed. Respirations equal and unlabored. Will continue to monitor for safety.  

## 2022-10-17 NOTE — ED Notes (Signed)
Watched the PT shave.

## 2022-10-17 NOTE — Group Note (Signed)
Group Topic: Overcoming Obstacles  Group Date: 10/17/2022 Start Time: 1000 End Time: 1100 Facilitators: Ninfa Linden, NT+3 MHT 2  Department: [facility]  Number of Participants: 5  Group Focus: coping skills Treatment Modality:  Behavior Modification Therapy Interventions utilized were problem solving Purpose: trigger / craving management  Name: Danny West Date of Birth: Jul 05, 1975  MR: [id number]    Level of Participation: Did not attend group Quality of Participation: N/A Interactions with others: N/A Mood/Affect: N/A Triggers (if applicable): N/A Cognition: N/A Progress: N/A Response: N/A Plan: N/A  Patients Problems:  Patient Active Problem List   Diagnosis Date Noted   Alcohol abuse 10/14/2022   Alcohol-induced mood disorder (HCC) 03/09/2022   H/O suicide attempt 09/14/2021   Alcohol use disorder, severe, dependence (HCC) 09/13/2021   Snuff user 03/05/2021   Mild intermittent asthma without complication 03/05/2021   Acquired hypothyroidism 03/05/2021   Moderate major depression (HCC) 03/05/2021   MDD (major depressive disorder), recurrent severe, without psychosis (HCC) 01/20/2021   Cellulitis of left lower extremity 01/20/2021

## 2022-10-17 NOTE — ED Notes (Signed)
 Patient in the bedroom sleeping. NAD.  Respirations are even and unlabored. Will continue to monitor for safety.

## 2022-10-18 DIAGNOSIS — F331 Major depressive disorder, recurrent, moderate: Secondary | ICD-10-CM | POA: Diagnosis not present

## 2022-10-18 DIAGNOSIS — D539 Nutritional anemia, unspecified: Secondary | ICD-10-CM | POA: Diagnosis not present

## 2022-10-18 DIAGNOSIS — E785 Hyperlipidemia, unspecified: Secondary | ICD-10-CM | POA: Diagnosis not present

## 2022-10-18 DIAGNOSIS — E039 Hypothyroidism, unspecified: Secondary | ICD-10-CM | POA: Diagnosis not present

## 2022-10-18 LAB — COMPREHENSIVE METABOLIC PANEL
ALT: 24 U/L (ref 0–44)
AST: 45 U/L — ABNORMAL HIGH (ref 15–41)
Albumin: 3 g/dL — ABNORMAL LOW (ref 3.5–5.0)
Alkaline Phosphatase: 131 U/L — ABNORMAL HIGH (ref 38–126)
Anion gap: 9 (ref 5–15)
BUN: 9 mg/dL (ref 6–20)
CO2: 26 mmol/L (ref 22–32)
Calcium: 8.7 mg/dL — ABNORMAL LOW (ref 8.9–10.3)
Chloride: 104 mmol/L (ref 98–111)
Creatinine, Ser: 0.88 mg/dL (ref 0.61–1.24)
GFR, Estimated: >60 mL/min (ref >60)
Glucose, Bld: 100 mg/dL — ABNORMAL HIGH (ref 70–99)
Potassium: 3.9 mmol/L (ref 3.5–5.1)
Sodium: 139 mmol/L (ref 135–145)
Total Bilirubin: 1.8 mg/dL — ABNORMAL HIGH (ref 0.3–1.2)
Total Protein: 6.4 g/dL — ABNORMAL LOW (ref 6.5–8.1)

## 2022-10-18 LAB — TSH: TSH: 83.839 u[IU]/mL — ABNORMAL HIGH (ref 0.350–4.500)

## 2022-10-18 LAB — T4, FREE: Free T4: 0.53 ng/dL — ABNORMAL LOW (ref 0.61–1.12)

## 2022-10-18 MED ORDER — NALTREXONE HCL 50 MG PO TABS
25.0000 mg | ORAL_TABLET | Freq: Once | ORAL | Status: AC
  Administered 2022-10-18: 25 mg via ORAL
  Filled 2022-10-18: qty 1

## 2022-10-18 MED ORDER — QUETIAPINE FUMARATE 50 MG PO TABS
50.0000 mg | ORAL_TABLET | Freq: Once | ORAL | Status: AC | PRN
  Administered 2022-10-18: 50 mg via ORAL
  Filled 2022-10-18: qty 1

## 2022-10-18 MED ORDER — NALTREXONE HCL 50 MG PO TABS
50.0000 mg | ORAL_TABLET | Freq: Every day | ORAL | Status: DC
  Administered 2022-10-19: 50 mg via ORAL
  Filled 2022-10-18: qty 1
  Filled 2022-10-18: qty 14

## 2022-10-18 MED ORDER — QUETIAPINE FUMARATE 50 MG PO TABS
50.0000 mg | ORAL_TABLET | Freq: Every day | ORAL | Status: AC
  Administered 2022-10-18: 50 mg via ORAL
  Filled 2022-10-18: qty 14
  Filled 2022-10-18: qty 1

## 2022-10-18 MED ORDER — LORAZEPAM 1 MG PO TABS: 1.0000 mg | ORAL_TABLET | Freq: Four times a day (QID) | ORAL | Status: DC | PRN

## 2022-10-18 NOTE — ED Notes (Addendum)
Patient is in the bedroom awake and composed NAD. Requested for medicine to help him sleep. Trazodone 50mg  given as ordered. Respirations are even and unlabored. Will continue to monitor for safety.

## 2022-10-18 NOTE — Group Note (Signed)
Group Topic: Relapse and Recovery  Group Date: 10/18/2022 Start Time: 1000 End Time: 1100 Facilitators: Bertram Denver  Department: [facility]  Number of Participants: 5  Group Focus: community group Treatment Modality:  Cognitive Behavioral Therapy Interventions utilized were group exercise Purpose: express feelings  Name: Danny West Date of Birth: Aug 05, 1975  MR: [id number]    Level of Participation: active Quality of Participation: attentive Interactions with others: gave feedback Mood/Affect: positive Triggers (if applicable): na Cognition: coherent/clear Progress: Moderate Response: na Plan: follow-up needed  Patients Problems:  Patient Active Problem List   Diagnosis Date Noted   Alcohol abuse 10/14/2022   Alcohol-induced mood disorder (HCC) 03/09/2022   H/O suicide attempt 09/14/2021   Alcohol use disorder, severe, dependence (HCC) 09/13/2021   Snuff user 03/05/2021   Mild intermittent asthma without complication 03/05/2021   Acquired hypothyroidism 03/05/2021   Moderate major depression (HCC) 03/05/2021   MDD (major depressive disorder), recurrent severe, without psychosis (HCC) 01/20/2021   Cellulitis of left lower extremity 01/20/2021

## 2022-10-18 NOTE — Group Note (Signed)
Group Topic: Wellness  Group Date: 10/18/2022 Start Time: 2000 End Time: 2030 Facilitators: Guss Bunde  Department: [facility]  Number of Participants: 5  Group Focus: check in Treatment Modality:  Leisure Development Interventions utilized were support Purpose: reinforce self-care  Name: Danny West Date of Birth: 1975/07/23  MR: [id number]    Level of Participation: active Quality of Participation: cooperative Interactions with others: gave feedback Mood/Affect: appropriate Triggers (if applicable):  Cognition: logical Progress: Minimal Response: Pt said physical wellness is important Plan: patient will be encouraged to follow up with wellness goals  Patients Problems:  Patient Active Problem List   Diagnosis Date Noted   Alcohol abuse 10/14/2022   Alcohol-induced mood disorder (HCC) 03/09/2022   H/O suicide attempt 09/14/2021   Alcohol use disorder, severe, dependence (HCC) 09/13/2021   Snuff user 03/05/2021   Mild intermittent asthma without complication 03/05/2021   Acquired hypothyroidism 03/05/2021   Moderate major depression (HCC) 03/05/2021   MDD (major depressive disorder), recurrent severe, without psychosis (HCC) 01/20/2021   Cellulitis of left lower extremity 01/20/2021

## 2022-10-18 NOTE — Progress Notes (Signed)
 Pt slept most of the shift but woke up for meals. No distress noted or concerns voiced. Staff will monitor for pt's safety.

## 2022-10-18 NOTE — Discharge Planning (Signed)
LCSW went and spoke with patient for a check in. Patient reports having a good weekend and reports looking forward to admitting to [facility] on tomorrow. Patient aware that additional resources have been provided in his AVS for his follow up once residential placement has been complete. No other needs were reported by patient on today. Patient was encouraged to continue participating in programming on unit.   Fernande Boyden, LCSW Clinical Social Worker [facility] Ph: [phone]

## 2022-10-18 NOTE — ED Notes (Signed)
Patient is sleeping. Respirations equal and unlabored, skin warm and dry. No change in assessment or acuity. Routine safety checks conducted according to facility protocol. Will continue to monitor for safety.   

## 2022-10-18 NOTE — Progress Notes (Signed)
Pt is awake, alert and oriented X3. Pt did not voice any complaints of pain or discomfort. No signs of acute distress noted. Administered scheduled meds per order. Pt denies current SI/HI/AVH, plan or intent. Staff will monitor for pt's safety.

## 2022-10-18 NOTE — ED Notes (Signed)
Patient in the bedroom resting. Has no complains at the moment. Denies SI/HI &AVH. NAD. Respirations are even and unlabored. Will continue to monitor for safety.

## 2022-10-18 NOTE — ED Provider Notes (Addendum)
Behavioral Health Progress Note  Date and Time: 10/18/2022 3:12 PM Name: Danny West MRN:  [id number]  Subjective:  Danny West is a 47 y.o. male with PMH MDD, GAD, alcohol use d/o (no w/d hx), stimulant use d/o, hypothyroidism (on synthroid, non-adherent), suicide attempt (09/2021), inpatient psych admission, who was admitted to [facility] (10/14/2022) for alcohol detox  Patient reports that he has not been doing okay since the death of his dad.  However he reports that he is hopeful for making changes life including going to resume treatment for alcohol.  He reports that he wants to get his kid back into his life.  He denies alcohol withdrawal symptoms but does report some cravings.  We discussed starting naltrexone which he was amenable to.  Informed consent was provided for naltrexone and we discussed starting with 25 today and titrating to 50 mg tomorrow morning and that he would take it daily for treating alcohol cravings.  He reported that he had issues sleeping last night and got a Seroquel 50 which was helpful.  We discussed scheduling this Seroquel prior to bedtime and he was agreeable to this.  He also has trazodone 50 mg as needed.  Patient is aware of his worsening liver function and the importance of following up with his PCP which he reports that he has.  He was also educated on the importance of continuing to take his thyroid medication given that he has abnormal thyroid function testing.  Also prior to interview when asked what the patient's pronouns were given the nonbinary gender in the EMR, the patient reported that he is actually cisgender male and he  "just wrote nonbinary as a joke" (this has been updated in the EMR).   Diagnosis:  Final diagnoses:  Alcohol use disorder, severe, dependence (HCC)  Alcohol withdrawal syndrome without complication (HCC)  Abnormal LFTs (liver function tests)  Hypothyroidism, unspecified type  MDD (major depressive disorder), recurrent episode, moderate  (HCC)  GAD (generalized anxiety disorder)  Dyslipidemia  Macrocytic anemia  Nicotine use disorder      Total Time spent with patient: 20 minutes  Past Psychiatric History:  Dx: MDD, GAD, alcohol use d/o (no w/d hx), stimulant use d/o (vyvanse, adderal), self-reported ADHD Prior Rx: naltrexone 50 mg daily. Prozac in 1998, and Zoloft from 2019-January of 2022. Cytomel 25 mg daily Current OP psychiatrist: Denied Prior OP psychiatrist: Unsure Current OP therapist: [facility] PCP: Denied Suicide Attempt: Yes, June 2023 - OD on handful of zoloft and naltrexone, wrote on his white board at his apartment that if he wakes up the next day, he needs to live for his children, did not seek medical attention or told therapist.  Inpatient psych: 01/24/2021 [facility] Violence/Aggression: Denied  Substance use EtOH: Yes. Twisted tea x1 in AM & 14oz beer x4 after work. 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[employer], Web designer loading   Current Medications:  Current Facility-Administered Medications  Medication Dose Route Frequency Provider Last Rate Last Admin  alum & mag hydroxide-simeth (MAALOX/MYLANTA) 200-200-20 MG/5ML suspension 30 mL  30 mL Oral Q4H PRN Onuoha, Chinwendu V, NP       ibuprofen (ADVIL) tablet 200 mg  200 mg Oral Q6H PRN Princess Bruins, DO       levothyroxine (SYNTHROID) tablet 200 mcg  200 mcg Oral Q0600  Onuoha, Chinwendu V, NP   200 mcg at 10/18/22 2956   LORazepam (ATIVAN) tablet 1 mg  1 mg Oral Q6H PRN Massengill, Harrold Donath, MD       magnesium hydroxide (MILK OF MAGNESIA) suspension 30 mL  30 mL Oral Daily PRN Onuoha, Chinwendu V, NP       multivitamin with minerals tablet 1 tablet  1 tablet Oral Daily Onuoha, Chinwendu V, NP   1 tablet at 10/18/22 0846   naltrexone (DEPADE) tablet 25 mg  25 mg Oral Once Meryl Dare, MD       Melene Muller ON 10/19/2022] naltrexone (DEPADE) tablet 50 mg  50 mg Oral Daily Meryl Dare, MD       nicotine (NICODERM CQ - dosed in mg/24 hours) patch 21 mg  21 mg Transdermal Daily Rayburn Go, Veronique M, NP   21 mg at 10/18/22 0847   QUEtiapine (SEROQUEL) tablet 50 mg  50 mg Oral QHS Meryl Dare, MD       sertraline (ZOLOFT) tablet 100 mg  100 mg Oral Daily Onuoha, Chinwendu V, NP   100 mg at 10/18/22 0847   thiamine (VITAMIN B1) injection 100 mg  100 mg Intramuscular Once Onuoha, Chinwendu V, NP       thiamine (VITAMIN B1) tablet 100 mg  100 mg Oral Daily Onuoha, Chinwendu V, NP   100 mg at 10/18/22 0846   traZODone (DESYREL) tablet 50 mg  50 mg Oral QHS PRN Onuoha, Chinwendu V, NP   50 mg at 10/17/22 2116   Current Outpatient Medications  Medication Sig Dispense Refill   albuterol (VENTOLIN HFA) 108 (90 Base) MCG/ACT inhaler Inhale 2 puffs into the lungs every 6 (six) hours as needed for wheezing or shortness of breath.     Ascorbic Acid (VITAMIN C ADULT GUMMIES PO) Take 2 tablets by mouth daily.     ibuprofen (ADVIL) 200 MG tablet Take 400 mg by mouth every 6 (six) hours as needed (For pain).     levothyroxine (SYNTHROID) 200 MCG tablet Take 1 tablet (200 mcg total) by mouth daily before breakfast. 30 tablet 4   Omega-3 Fatty Acids (OMEGA 3 PO) Take 1 capsule by mouth daily.     sertraline (ZOLOFT) 100 MG tablet Take 1 tablet (100 mg total) by mouth daily.     triamcinolone ointment (KENALOG) 0.1 % Apply 1 Application topically 2 (two) times daily as needed (Apply  to affected area).      Labs  Lab Results:  Admission on 10/14/2022  Component Date Value Ref Range Status   Free T4 10/17/2022 0.49 (L)  0.61 - 1.12 ng/dL Final   Comment: (NOTE) Biotin ingestion may interfere with free T4 tests. If the results are inconsistent with the TSH level, previous test results, or the clinical presentation, then consider biotin interference. If needed, order repeat testing after stopping biotin. Performed at [facility], [address] [place], [place] [address]    T3, Free 10/14/2022 1.7 (L)  2.0 - 4.4 pg/mL Final   Comment: (NOTE) Performed At: [facility] [address] [place], [place] [id number] Jolene Schimke MD [id number]    Free T4 10/14/2022 0.38 (L)  0.61 - 1.12 ng/dL Final   Comment: (NOTE) Biotin ingestion may interfere with free T4 tests. If the results are inconsistent with the TSH level, previous test results, or the clinical presentation, then consider biotin interference. If needed, order repeat testing after stopping biotin. Performed at [facility], [address] [place], [place] [address]    WBC 10/17/2022 3.7 (L)  4.0 - 10.5 K/uL Final   RBC 10/17/2022 2.99 (L)  4.22 - 5.81 MIL/uL Final   Hemoglobin 10/17/2022 10.7 (L)  13.0 - 17.0 g/dL Final   HCT 66/06/3014 31.4 (L)  39.0 - 52.0 % Final   MCV 10/17/2022 105.0 (H)  80.0 - 100.0 fL Final   MCH 10/17/2022 35.8 (H)  26.0 - 34.0 pg Final   MCHC 10/17/2022 34.1  30.0 - 36.0 g/dL Final   RDW 01/19/3233 15.7 (H)  11.5 - 15.5 % Final   Platelets 10/17/2022 91 (L)  150 - 400 K/uL Final   Comment: Immature Platelet Fraction may be clinically indicated, consider ordering this additional test [id number] REPEATED TO VERIFY PLATELET COUNT CONFIRMED BY SMEAR    nRBC 10/17/2022 0.0  0.0 - 0.2 % Final   Neutrophils Relative % 10/17/2022 53  % Final   Neutro Abs 10/17/2022 2.0  1.7 - 7.7 K/uL Final   Lymphocytes Relative 10/17/2022 33  % Final   Lymphs Abs  10/17/2022 1.2  0.7 - 4.0 K/uL Final   Monocytes Relative 10/17/2022 10  % Final   Monocytes Absolute 10/17/2022 0.4  0.1 - 1.0 K/uL Final   Eosinophils Relative 10/17/2022 3  % Final   Eosinophils Absolute 10/17/2022 0.1  0.0 - 0.5 K/uL Final   Basophils Relative 10/17/2022 1  % Final   Basophils Absolute 10/17/2022 0.0  0.0 - 0.1 K/uL Final   Immature Granulocytes 10/17/2022 0  % Final   Abs Immature Granulocytes 10/17/2022 0.01  0.00 - 0.07 K/uL Final   Performed at [facility], [address] [place], [place] [address]   Cholesterol 10/17/2022 220 (H)  0 - 200 mg/dL Final   Triglycerides 23/76/2831 77  <150 mg/dL Final   HDL 51/76/1607 58  >40 mg/dL Final   Total CHOL/HDL Ratio 10/17/2022 3.8  RATIO Final   VLDL 10/17/2022 15  0 - 40 mg/dL Final   LDL Cholesterol 10/17/2022 147 (H)  0 - 99 mg/dL Final   Comment:        Total Cholesterol/HDL:CHD Risk Coronary Heart Disease Risk Table                     Men   Women  1/2 Average Risk   3.4   3.3  Average Risk       5.0   4.4  2 X Average Risk   9.6   7.1  3 X Average Risk  23.4   11.0        Use the calculated Patient Ratio above and the CHD Risk Table to determine the patient's CHD Risk.        ATP III CLASSIFICATION (LDL):  <100     mg/dL   Optimal  371-062  mg/dL   Near or Above                    Optimal  130-159  mg/dL   Borderline  694-854  mg/dL   High  >627     mg/dL   Very High Performed at [facility]  Lab, [address] [place], [place] [address]    Vitamin B-12 10/17/2022 420  180 - 914 pg/mL Final   Comment: (NOTE) This assay is not validated for testing neonatal or myeloproliferative syndrome specimens for Vitamin B12 levels. Performed at [facility], [address], [place], [place] [address]    Folate 10/17/2022 12.8  >5.9 ng/mL Final   Performed at [facility], [address], [place], [place] [address]   Iron 10/17/2022 64  45 - 182 ug/dL Final   TIBC 91/47/8295 402  250 -  450 ug/dL Final   Saturation Ratios 10/17/2022 16 (L)  17.9 - 39.5 % Final   UIBC 10/17/2022 338  ug/dL Final   Performed at [facility], [address] [place], [place] [address]   Ferritin 10/17/2022 200  24 - 336 ng/mL Final   Performed at [facility], [address], [place], [place] [address]   Sodium 10/17/2022 138  135 - 145 mmol/L Final   Potassium 10/17/2022 3.3 (L)  3.5 - 5.1 mmol/L Final   Chloride 10/17/2022 101  98 - 111 mmol/L Final   CO2 10/17/2022 28  22 - 32 mmol/L Final   Glucose, Bld 10/17/2022 94  70 - 99 mg/dL Final   Glucose reference range applies only to samples taken after fasting for at least 8 hours.   BUN 10/17/2022 10  6 - 20 mg/dL Final   Creatinine, Ser 10/17/2022 0.92  0.61 - 1.24 mg/dL Final   Calcium 46/96/2952 8.9  8.9 - 10.3 mg/dL Final   Total Protein 84/13/2440 6.6  6.5 - 8.1 g/dL Final   Albumin 11/07/2534 2.9 (L)  3.5 - 5.0 g/dL Final   AST 64/40/3474 50 (H)  15 - 41 U/L Final   ALT 10/17/2022 25  0 - 44 U/L Final   Alkaline Phosphatase 10/17/2022 153 (H)  38 - 126 U/L Final   Total Bilirubin 10/17/2022 1.9 (H)  0.3 - 1.2 mg/dL Final   GFR, Estimated 10/17/2022 >60  >60 mL/min Final   Comment: (NOTE) Calculated using the CKD-EPI Creatinine Equation (2021)    Anion gap 10/17/2022 9  5 - 15 Final   Performed at [facility], [address] [place], [place] [address]  Admission on 10/14/2022, Discharged on 10/14/2022  Component Date Value Ref Range Status   WBC 10/14/2022 4.9  4.0 - 10.5 K/uL Final   RBC 10/14/2022 3.10 (L)  4.22 - 5.81 MIL/uL Final   Hemoglobin 10/14/2022 11.1 (L)  13.0 - 17.0 g/dL Final   HCT 38/75/6433 32.5 (L)  39.0 - 52.0 % Final   MCV 10/14/2022 104.8 (H)  80.0 - 100.0 fL Final   MCH 10/14/2022 35.8 (H)  26.0 - 34.0 pg Final   MCHC 10/14/2022 34.2  30.0 - 36.0 g/dL Final   RDW 29/51/8841 16.3 (H)  11.5 - 15.5 % Final   Platelets 10/14/2022 91 (L)  150 - 400 K/uL Final   Comment: SPECIMEN CHECKED  FOR CLOTS Immature Platelet Fraction may be clinically indicated, consider ordering this additional test [id number] REPEATED TO VERIFY    nRBC 10/14/2022 0.0  0.0 - 0.2 % Final   Neutrophils Relative % 10/14/2022 68  % Final   Neutro Abs 10/14/2022 3.3  1.7 - 7.7 K/uL Final   Lymphocytes Relative 10/14/2022 22  % Final   Lymphs Abs 10/14/2022 1.1  0.7 - 4.0 K/uL Final   Monocytes Relative 10/14/2022 8  %  Final   Monocytes Absolute 10/14/2022 0.4  0.1 - 1.0 K/uL Final   Eosinophils Relative 10/14/2022 1  % Final   Eosinophils Absolute 10/14/2022 0.1  0.0 - 0.5 K/uL Final   Basophils Relative 10/14/2022 1  % Final   Basophils Absolute 10/14/2022 0.1  0.0 - 0.1 K/uL Final   Immature Granulocytes 10/14/2022 0  % Final   Abs Immature Granulocytes 10/14/2022 0.02  0.00 - 0.07 K/uL Final   Performed at [facility], [address] [place], [place] [address]   Sodium 10/14/2022 139  135 - 145 mmol/L Final   Potassium 10/14/2022 3.9  3.5 - 5.1 mmol/L Final   Chloride 10/14/2022 103  98 - 111 mmol/L Final   CO2 10/14/2022 26  22 - 32 mmol/L Final   Glucose, Bld 10/14/2022 104 (H)  70 - 99 mg/dL Final   Glucose reference range applies only to samples taken after fasting for at least 8 hours.   BUN 10/14/2022 8  6 - 20 mg/dL Final   Creatinine, Ser 10/14/2022 0.77  0.61 - 1.24 mg/dL Final   Calcium 47/82/9562 9.2  8.9 - 10.3 mg/dL Final   Total Protein 13/08/6576 7.8  6.5 - 8.1 g/dL Final   Albumin 46/96/2952 3.4 (L)  3.5 - 5.0 g/dL Final   AST 84/13/2440 99 (H)  15 - 41 U/L Final   ALT 10/14/2022 36  0 - 44 U/L Final   Alkaline Phosphatase 10/14/2022 236 (H)  38 - 126 U/L Final   Total Bilirubin 10/14/2022 2.5 (H)  0.3 - 1.2 mg/dL Final   GFR, Estimated 10/14/2022 >60  >60 mL/min Final   Comment: (NOTE) Calculated using the CKD-EPI Creatinine Equation (2021)    Anion gap 10/14/2022 10  5 - 15 Final   Performed at [facility], [address], [place], [place] [address]   Hgb  A1c MFr Bld 10/14/2022 4.1 (L)  4.8 - 5.6 % Final   Comment: (NOTE) Pre diabetes:          5.7%-6.4%  Diabetes:              >6.4%  Glycemic control for   <7.0% adults with diabetes    Mean Plasma Glucose 10/14/2022 70.97  mg/dL Final   Performed at [facility], [address], [place], [place] [address]   Magnesium 10/14/2022 2.0  1.7 - 2.4 mg/dL Final   Performed at [facility], [address] [place], [place] [address]   Alcohol, Ethyl (B) 10/14/2022 <10  <10 mg/dL Final   Comment: (NOTE) Lowest detectable limit for serum alcohol is 10 mg/dL.  For medical purposes only. Performed at [facility], [address], [place], [place] [address]    RPR Ser Ql 10/14/2022 NON REACTIVE  NON REACTIVE Final   Performed at [facility], [address] [place], [place] [address]   Color, Urine 10/14/2022 AMBER (A)  YELLOW Final   BIOCHEMICALS MAY BE AFFECTED BY COLOR   APPearance 10/14/2022 CLEAR  CLEAR Final   Specific Gravity, Urine 10/14/2022 1.017  1.005 - 1.030 Final   pH 10/14/2022 6.0  5.0 - 8.0 Final   Glucose, UA 10/14/2022 NEGATIVE  NEGATIVE mg/dL Final   Hgb urine dipstick 10/14/2022 NEGATIVE  NEGATIVE Final   Bilirubin Urine 10/14/2022 NEGATIVE  NEGATIVE Final   Ketones, ur 10/14/2022 NEGATIVE  NEGATIVE mg/dL Final   Protein, ur 64/33/2951 NEGATIVE  NEGATIVE mg/dL Final  Nitrite 10/14/2022 NEGATIVE  NEGATIVE Final   Leukocytes,Ua 10/14/2022 NEGATIVE  NEGATIVE Final   Performed at [facility], [address], [place], [place] [address]   POC Amphetamine UR 10/14/2022 None Detected  NONE DETECTED (Cut Off Level 1000 ng/mL) Final   POC Secobarbital (BAR) 10/14/2022 None Detected  NONE DETECTED (Cut Off Level 300 ng/mL) Final   POC Buprenorphine (BUP) 10/14/2022 None Detected  NONE DETECTED (Cut Off Level 10 ng/mL) Final   POC Oxazepam (BZO) 10/14/2022 Positive (A)  NONE DETECTED (Cut Off Level 300 ng/mL) Final   POC Cocaine UR 10/14/2022 None  Detected  NONE DETECTED (Cut Off Level 300 ng/mL) Final   POC Methamphetamine UR 10/14/2022 None Detected  NONE DETECTED (Cut Off Level 1000 ng/mL) Final   POC Morphine 10/14/2022 None Detected  NONE DETECTED (Cut Off Level 300 ng/mL) Final   POC Methadone UR 10/14/2022 None Detected  NONE DETECTED (Cut Off Level 300 ng/mL) Final   POC Oxycodone UR 10/14/2022 None Detected  NONE DETECTED (Cut Off Level 100 ng/mL) Final   POC Marijuana UR 10/14/2022 None Detected  NONE DETECTED (Cut Off Level 50 ng/mL) Final   Cholesterol 10/14/2022 275 (H)  0 - 200 mg/dL Final   Triglycerides 19/14/7829 255 (H)  <150 mg/dL Final   HDL 56/21/3086 51  >40 mg/dL Final   Total CHOL/HDL Ratio 10/14/2022 5.4  RATIO Final   VLDL 10/14/2022 51 (H)  0 - 40 mg/dL Final   LDL Cholesterol 10/14/2022 173 (H)  0 - 99 mg/dL Final   Comment:        Total Cholesterol/HDL:CHD Risk Coronary Heart Disease Risk Table                     Men   Women  1/2 Average Risk   3.4   3.3  Average Risk       5.0   4.4  2 X Average Risk   9.6   7.1  3 X Average Risk  23.4   11.0        Use the calculated Patient Ratio above and the CHD Risk Table to determine the patient's CHD Risk.        ATP III CLASSIFICATION (LDL):  <100     mg/dL   Optimal  578-469  mg/dL   Near or Above                    Optimal  130-159  mg/dL   Borderline  629-528  mg/dL   High  >413     mg/dL   Very High Performed at [facility], [address] [place], [place] [address]    TSH 10/14/2022 155.020 (H)  0.350 - 4.500 uIU/mL Final   Comment: Performed by a 3rd Generation assay with a functional sensitivity of <=0.01 uIU/mL. Performed at [facility], [address], [place], [place] [address]     Blood Alcohol level:  Lab Results  Component Value Date   ETH <10 10/14/2022   ETH 53 (H) 03/09/2022    Metabolic Disorder Labs: Lab Results  Component Value Date   HGBA1C 4.1 (L) 10/14/2022   MPG 70.97 10/14/2022   MPG 100 03/09/2022    Lab Results  Component Value Date   PROLACTIN 10.0 03/09/2022   Lab Results  Component Value Date   CHOL 220 (H) 10/17/2022   TRIG 77 10/17/2022   HDL 58 10/17/2022   CHOLHDL 3.8  10/17/2022   VLDL 15 10/17/2022   LDLCALC 147 (H) 10/17/2022   LDLCALC 173 (H) 10/14/2022    Physical Findings   AIMS    Flowsheet Row [facility]  AIMS Total Score 0      AUDIT    Flowsheet Row Ad[facility] ([facility]  Alcohol Use Disorder Identification Test Final Score (AUDIT) 13      GAD-7    Flowsheet Row Office Visit from 01/22/2022 in [facility]  Total GAD-7 Score 1      PHQ2-9    Flowsheet Row ED from 10/14/2022 in [facility] Most recent reading at 10/18/2022  2:32 PM ED from 10/14/2022 in [facility] Most recent reading at 10/14/2022  3:19 PM Office Visit from 01/22/2022 in [facility] Most recent reading at 01/22/2022 10:00 AM ED from 09/13/2021 in [facility] Most recent reading at 09/19/2021 10:07 AM Office Visit from 05/18/2021 in [facility] Most recent reading at 05/18/2021 10:41 AM  PHQ-2 Total Score 2 2 0 1 0  PHQ-9 Total Score 5 10 1 6 3       Flowsheet Row ED from 10/14/2022 in [facility] Most recent reading at 10/14/2022  9:56 PM ED from 10/14/2022 in [facility] Most recent reading at 10/14/2022  4:58 PM ED from 03/09/2022 in [facility] Most recent reading at 03/09/2022  4:17 PM  C-SSRS RISK CATEGORY No Risk Error: Q3, 4, or 5 should not be populated when Q2 is No No Risk        Musculoskeletal  Strength & Muscle Tone: within normal limits Gait & Station:  normal Patient leans: N/A  Psychiatric Specialty Exam  Presentation  General Appearance:  Appropriate for Environment  Eye Contact: Good  Speech: Normal Rate  Speech Volume: Normal  Handedness: Right   Mood and Affect  Mood: Euphoric  Affect: Appropriate   Thought Process  Thought Processes: Coherent  Descriptions of Associations:Intact  Orientation:Full (Time, Place and Person)  Thought Content:Logical  Diagnosis of Schizophrenia or Schizoaffective disorder in past: No    Hallucinations:Hallucinations: None  Ideas of Reference:None  Suicidal Thoughts:Suicidal Thoughts: No  Homicidal Thoughts:Homicidal Thoughts: No   Sensorium  Memory: Immediate Good; Recent Good; Remote Good  Judgment: Fair  Insight: Fair   Art therapist  Concentration: Good  Attention Span: Good  Recall: Good  Fund of Knowledge: Good  Language: Good   Psychomotor Activity  Psychomotor Activity: Psychomotor Activity: Normal    Assets  Assets: Desire for Improvement   Sleep  Sleep: Sleep: Fair (Required Seroquel at 0200) Number of Hours of Sleep: 7   No data recorded   Physical Exam  Physical Exam Vitals and nursing note reviewed.  Constitutional:      General: He is not in acute distress.    Appearance: He is not toxic-appearing or diaphoretic.  HENT:     Head: Normocephalic and atraumatic.  Eyes:     Conjunctiva/sclera: Conjunctivae normal.  Pulmonary:     Effort: Pulmonary effort is normal. No respiratory distress.  Skin:    Coloration: Skin is not jaundiced.  Neurological:     General: No focal deficit present.     Mental Status: He is alert and oriented to person, place,  and time.     Gait: Gait normal.    Review of Systems  Constitutional:  Negative for fever.  Cardiovascular:  Negative for chest pain and palpitations.  Gastrointestinal:  Negative for constipation, diarrhea, nausea and vomiting.  Neurological:   Negative for dizziness, weakness and headaches.   Blood pressure 122/89, pulse 93, temperature 98.9 F (37.2 C), temperature source Oral, resp. rate 20, SpO2 99%. There is no height or weight on file to calculate BMI.  Treatment Plan Summary: Daily contact with patient to assess and evaluate symptoms and progress in treatment and Medication management  AUD, severe  alcohol withdrawal without complication No withdrawal symptoms and Ativan taper ends today.  [facility] for alcohol. Discontinuing CIWA protocol, continue to monitor vitals Start naltrexone 25 mg once daily and increase to 50 mg tomorrow for alcohol dependence, repeating CMP to trend liver function  MDD  GAD Currently euthymic.  Received Seroquel 50 mg once yesterday night and found benefit. Continued home zoloft 100 mg daily for mood and anxiety Scheduling Seroquel 50 mg once at night for insomnia Continue trazodone 50 mg once at night as needed for insomnia  Abnormal liver function tests Significant history of alcohol use.  Mildly elavated AST/ALT downtrending, elevated total bili downtrending. Concern for hepatic function due to low plt, albumin, and faint jaundice on physical exam. Not worsening likely due to EtOH cessation.  Avoid hepatotoxic rx PCP for referral and further workup including trending liver function test with CMP and CBC. Education with patient on importance of alcohol cessation and severity of progression to cirrhosis  Hypothyroidism Elevated TSH low T4 low T3.  Nonadherent to Synthroid treatment. Continued home synthroid 200 mcg PCP follow-up  Dyslipidemia Cholesterol high 220, circular slide normal at 77, HDL normal at 58, LDL elevated at 147.  Could be related to thyroid disease but likely idiopathic and given LDL of 147 would likely benefit from high dose statin for the primary prevention of cardiovascular events. Given elevated liver function we will defer  to PCP. PCP follow-up and repeat lipid panel at appointment  Macorytic anemia Was microcytic then macrocytic on repeat lab. Suspect 2/2 hypothyroidism, expect improvement as reaching euthymia. If persists despite euthymia, recommend further workup, consider T3.  B12, folate, iron panel, ferritin wnl except mildly low sat level Synthroid per above PCP follow-up  Nicotine use d/o Precontemplative.  NRT offered.  Dispo: [facility] tomorrow morning.  Meryl Dare, MD PGY-1 Psychiatry Resident 10/18/2022, 3:12 PM

## 2022-10-18 NOTE — ED Notes (Signed)
Pt request for Seroquel to help with sleep, stating trazodone does not help, it just give him headache. Medication was administered.

## 2022-10-19 DIAGNOSIS — F331 Major depressive disorder, recurrent, moderate: Secondary | ICD-10-CM | POA: Diagnosis not present

## 2022-10-19 DIAGNOSIS — D539 Nutritional anemia, unspecified: Secondary | ICD-10-CM | POA: Diagnosis not present

## 2022-10-19 DIAGNOSIS — E039 Hypothyroidism, unspecified: Secondary | ICD-10-CM | POA: Diagnosis not present

## 2022-10-19 DIAGNOSIS — E785 Hyperlipidemia, unspecified: Secondary | ICD-10-CM | POA: Diagnosis not present

## 2022-10-19 LAB — T3, FREE: T3, Free: 1.9 pg/mL — ABNORMAL LOW (ref 2.0–4.4)

## 2022-10-19 MED ORDER — TRAZODONE HCL 50 MG PO TABS: 50.0000 mg | ORAL_TABLET | Freq: Every evening | ORAL | Status: DC | PRN

## 2022-10-19 MED ORDER — LEVOTHYROXINE SODIUM 200 MCG PO TABS: 200.0000 ug | ORAL_TABLET | Freq: Every day | ORAL | Status: DC

## 2022-10-19 MED ORDER — LEVOTHYROXINE SODIUM 200 MCG PO TABS: 200.0000 ug | ORAL_TABLET | Freq: Every day | ORAL | 0 refills | Status: DC

## 2022-10-19 MED ORDER — NALTREXONE HCL 50 MG PO TABS: 50.0000 mg | ORAL_TABLET | Freq: Every day | ORAL | 0 refills | Status: DC

## 2022-10-19 MED ORDER — SERTRALINE HCL 100 MG PO TABS: 100.0000 mg | ORAL_TABLET | Freq: Every day | ORAL | 0 refills | Status: DC

## 2022-10-19 MED ORDER — NICOTINE 21 MG/24HR TD PT24: 21.0000 mg | MEDICATED_PATCH | Freq: Every day | TRANSDERMAL | 0 refills | Status: DC

## 2022-10-19 MED ORDER — TRAZODONE HCL 50 MG PO TABS: 50.0000 mg | ORAL_TABLET | Freq: Every evening | ORAL | 0 refills | Status: DC | PRN

## 2022-10-19 MED ORDER — NALTREXONE HCL 50 MG PO TABS: 50.0000 mg | ORAL_TABLET | Freq: Every day | ORAL | Status: DC

## 2022-10-19 MED ORDER — QUETIAPINE FUMARATE 50 MG PO TABS
50.0000 mg | ORAL_TABLET | Freq: Every day | ORAL | 0 refills | Status: DC
Start: 2022-10-19 — End: 2023-06-26

## 2022-10-19 NOTE — Progress Notes (Signed)
Pt is awake, alert and oriented X3. Pt did not voice any complaints of pain or discomfort. No signs of acute distress noted. Administered scheduled meds per order. Pt denies current SI/HI/AVH, plan or intent. Staff will monitor for pt's safety.

## 2022-10-19 NOTE — ED Notes (Signed)
Patient is in the bedroom sleeping.NAD.  Respirations are even and unlabored.  Will continue to monitor for safety.

## 2022-10-19 NOTE — ED Provider Notes (Signed)
[facility]BC/OBS ASAP Discharge Summary  Date and Time: 10/19/2022 8:27 AM  Name: Danny West  MRN:  [id number]   Discharge Diagnoses:  Final diagnoses:  Alcohol use disorder, severe, dependence (HCC)  Alcohol withdrawal syndrome without complication (HCC)  Abnormal LFTs (liver function tests)  Hypothyroidism, unspecified type  MDD (major depressive disorder), recurrent episode, moderate (HCC)  GAD (generalized anxiety disorder)  Dyslipidemia  Macrocytic anemia  Nicotine use disorder    Subjective: On the day of discharge patient reports no concerns other than some issues with sleeping.  He reports that he found benefit from the Seroquel at bedtime and also having the trazodone as needed.  He does not report any other significant depressive symptoms that are interfering with his life including depressed mood, loss of interest.  We discussed the importance of him following up with his PCP regarding his liver function testing and hypothyroidism.  He was understanding and agreeable to this.  We also discussed the effect that alcohol has had on his liver function testing and how his current intentions to stop using alcohol or the best thing to do for improving his liver function.  He denies any suicidal thoughts.  He is eating okay.  He is not experiencing any alcohol withdrawals.  He is experiencing cravings but have responded to the naltrexone that was started yesterday and titrated up this morning.  Stay Summary: Prior to admission to [facility] patient had experienced his father dying and had returned to alcohol use.  He has history of alcohol use disorder and was in remission.  Due to significant withdrawal symptoms he was started on a fixed dose lorazepam taper which she tolerated well with minimal withdrawal symptoms.  He was restarted on naltrexone which he finds beneficial for alcohol cravings.  He was continued on his sertraline 100.  With regard to his labs he has elevated liver function testing likely  secondary to alcohol use.  These values are down trended since he has been here.  Recommending close follow-up with PCP and cessation of alcohol which he is currently pursuing.  Furthermore he had significant hypothyroidism observed on labs but without symptoms.  He reported nonadherence to levothyroxine and was restarted at [facility] and will continue to take.  Psych Meds:   Continued on sertraline 100 mL once daily for MDD, GAD Started on naltrexone 25 mg once daily and increased to 50 mg once daily for alcohol dependence Start Seroquel 50 mg once at night for insomnia, MDD Started trazodone 50 mg once at night as needed for insomnia   Total Time spent with patient: 30 minutes  Past Psychiatric History:  Dx: MDD, GAD, alcohol use d/o (no w/d hx), stimulant use d/o (vyvanse, adderal), self-reported ADHD Prior Rx: naltrexone 50 mg daily. Prozac in 1998, and Zoloft from 2019-January of 2022. Cytomel 25 mg daily Current OP psychiatrist: Denied Prior OP psychiatrist: Unsure Current OP therapist: [facility] [place] PCP: Denied Suicide Attempt: Yes, June 2023 - OD on handful of zoloft and naltrexone, wrote on his white board at his apartment that if he wakes up the next day, he needs to live for his children, did not seek medical attention or told therapist.  Inpatient psych: 01/24/2021 [facility] Violence/Aggression: Denied   Substance use EtOH: Yes. Twisted tea x1 in AM & 14oz beer x4 after work. 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[employer], Web designer loading  Tobacco Cessation:  A prescription for an FDA-approved tobacco cessation medication provided at discharge  Current Medications:  Current Facility-Administered Medications  Medication Dose Route Frequency Provider Last Rate Last Admin   alum & mag hydroxide-simeth (MAALOX/MYLANTA) 200-200-20 MG/5ML suspension 30 mL  30 mL Oral Q4H PRN Onuoha, Chinwendu V, NP       ibuprofen (ADVIL) tablet 200 mg  200 mg Oral Q6H PRN Princess Bruins, DO       levothyroxine (SYNTHROID) tablet 200 mcg  200 mcg Oral Q0600 Onuoha, Chinwendu V, NP   200 mcg at 10/19/22 0615   LORazepam (ATIVAN) tablet 1 mg  1 mg Oral Q6H PRN Massengill, Harrold Donath, MD       magnesium hydroxide (MILK OF MAGNESIA) suspension 30 mL  30 mL Oral Daily PRN Onuoha, Chinwendu V, NP       multivitamin with minerals tablet 1 tablet  1 tablet Oral Daily Onuoha, Chinwendu V, NP   1 tablet at 10/19/22 0813   naltrexone (DEPADE) tablet 50 mg  50 mg Oral Daily Meryl Dare, MD   50 mg at 10/19/22 0813   nicotine (NICODERM CQ - dosed in mg/24 hours) patch 21 mg  21 mg Transdermal Daily Rayburn Go, Veronique M, NP   21 mg at 10/19/22 0815   sertraline (ZOLOFT) tablet 100 mg  100 mg Oral Daily Onuoha, Chinwendu V, NP   100 mg at 10/19/22 0813   thiamine (VITAMIN B1) injection 100 mg  100 mg Intramuscular Once Onuoha, Chinwendu V, NP       thiamine (VITAMIN B1) tablet 100 mg  100 mg Oral Daily Onuoha, Chinwendu V, NP   100 mg at 10/19/22 0813   traZODone (DESYREL) tablet 50 mg  50 mg Oral QHS PRN Onuoha, Chinwendu V, NP   50 mg at 10/18/22 2346   Current Outpatient Medications  Medication Sig Dispense Refill   QUEtiapine (SEROQUEL) 50 MG tablet Take 1 tablet (50 mg total) by mouth at bedtime. 30 tablet 0   ibuprofen (ADVIL) 200 MG tablet Take 400 mg by mouth every 6 (six) hours as needed (For pain).     levothyroxine  (SYNTHROID) 200 MCG tablet Take 1 tablet (200 mcg total) by mouth daily before breakfast. 30 tablet 0   naltrexone (DEPADE) 50 MG tablet Take 1 tablet (50 mg total) by mouth daily. 30 tablet 0   nicotine (NICODERM CQ - DOSED IN MG/24 HOURS) 21 mg/24hr patch Place 1 patch (21 mg total) onto the skin daily. 28 patch 0   sertraline (ZOLOFT) 100 MG tablet Take 1 tablet (100 mg total) by mouth daily. 30 tablet 0   traZODone (DESYREL) 50 MG tablet Take 1 tablet (50 mg total) by mouth at bedtime as needed for sleep. 30 tablet 0    PTA Medications:  Facility Ordered Medications  Medication   [COMPLETED] thiamine (VITAMIN B1) injection 100 mg   alum & mag hydroxide-simeth (MAALOX/MYLANTA) 200-200-20 MG/5ML suspension 30 mL   magnesium hydroxide (MILK OF MAGNESIA) suspension 30 mL   thiamine (VITAMIN B1) injection 100 mg   thiamine (VITAMIN B1)  tablet 100 mg   multivitamin with minerals tablet 1 tablet   [EXPIRED] LORazepam (ATIVAN) tablet 1 mg   [EXPIRED] hydrOXYzine (ATARAX) tablet 25 mg   [EXPIRED] loperamide (IMODIUM) capsule 2-4 mg   [EXPIRED] ondansetron (ZOFRAN-ODT) disintegrating tablet 4 mg   [COMPLETED] LORazepam (ATIVAN) tablet 1 mg   sertraline (ZOLOFT) tablet 100 mg   traZODone (DESYREL) tablet 50 mg   levothyroxine (SYNTHROID) tablet 200 mcg   nicotine (NICODERM CQ - dosed in mg/24 hours) patch 21 mg   [COMPLETED] LORazepam (ATIVAN) tablet 1 mg   [COMPLETED] LORazepam (ATIVAN) tablet 1 mg   Followed by   [COMPLETED] LORazepam (ATIVAN) tablet 1 mg   ibuprofen (ADVIL) tablet 200 mg   [COMPLETED] QUEtiapine (SEROQUEL) tablet 50 mg   LORazepam (ATIVAN) tablet 1 mg   [COMPLETED] naltrexone (DEPADE) tablet 25 mg   naltrexone (DEPADE) tablet 50 mg   [COMPLETED] QUEtiapine (SEROQUEL) tablet 50 mg   PTA Medications  Medication Sig   QUEtiapine (SEROQUEL) 50 MG tablet Take 1 tablet (50 mg total) by mouth at bedtime.   nicotine (NICODERM CQ - DOSED IN MG/24 HOURS) 21 mg/24hr patch  Place 1 patch (21 mg total) onto the skin daily.   sertraline (ZOLOFT) 100 MG tablet Take 1 tablet (100 mg total) by mouth daily.   traZODone (DESYREL) 50 MG tablet Take 1 tablet (50 mg total) by mouth at bedtime as needed for sleep.   levothyroxine (SYNTHROID) 200 MCG tablet Take 1 tablet (200 mcg total) by mouth daily before breakfast.   naltrexone (DEPADE) 50 MG tablet Take 1 tablet (50 mg total) by mouth daily.       10/18/2022    2:32 PM 10/15/2022   12:54 PM 10/14/2022    9:01 PM  Depression screen PHQ 2/9  Decreased Interest 1 1 3   Down, Depressed, Hopeless 1 1 3   PHQ - 2 Score 2 2 6   Altered sleeping 1 1 3   Tired, decreased energy 1 1 3   Change in appetite 0 0 3  Feeling bad or failure about yourself  0 0 3  Trouble concentrating 1 1 3   Moving slowly or fidgety/restless 0 1 2  Suicidal thoughts 0 0 0  PHQ-9 Score 5 6 23   Difficult doing work/chores Very difficult Very difficult Extremely dIfficult    Flowsheet Row ED from 10/14/2022 in [facility] Most recent reading at 10/14/2022  9:56 PM ED from 10/14/2022 in [facility] Most recent reading at 10/14/2022  4:58 PM ED from 03/09/2022 in [facility] Most recent reading at 03/09/2022  4:17 PM  C-SSRS RISK CATEGORY No Risk Error: Q3, 4, or 5 should not be populated when Q2 is No No Risk       Musculoskeletal  Strength & Muscle Tone: within normal limits Gait & Station: normal Patient leans: N/A  Psychiatric Specialty Exam  Presentation  General Appearance:  Appropriate for Environment  Eye Contact: Good  Speech: Normal Rate  Speech Volume: Normal  Handedness: Right   Mood and Affect  Mood: Euthymic  Affect: Appropriate; Congruent   Thought Process  Thought Processes: Coherent  Descriptions of Associations:Intact  Orientation:Full (Time, Place and Person)  Thought Content:Logical  Diagnosis of Schizophrenia or  Schizoaffective disorder in past: No    Hallucinations:Hallucinations: None  Ideas of Reference:None  Suicidal Thoughts:Suicidal Thoughts: No  Homicidal Thoughts:Homicidal Thoughts: No   Sensorium  Memory: Immediate Good; Recent Good; Remote Good  Judgment: Fair  Insight:  Fair   Art therapist  Concentration: Good  Attention Span: Good  Recall: Good  Fund of Knowledge: Good  Language: Good   Psychomotor Activity  Psychomotor Activity: Psychomotor Activity: Normal   Assets  Assets: Desire for Improvement   Sleep  Sleep: Sleep: Fair Number of Hours of Sleep: 8   No data recorded  Physical Exam  Physical Exam Vitals and nursing note reviewed.  Constitutional:      General: He is not in acute distress.    Appearance: He is well-developed.  HENT:     Head: Normocephalic and atraumatic.  Pulmonary:     Effort: Pulmonary effort is normal. No respiratory distress.  Musculoskeletal:        General: No swelling.  Neurological:     General: No focal deficit present.     Mental Status: He is alert.    Review of Systems  Constitutional:  Negative for fever.  Cardiovascular:  Negative for chest pain and palpitations.  Gastrointestinal:  Negative for constipation, diarrhea, nausea and vomiting.  Neurological:  Negative for dizziness, weakness and headaches.   Blood pressure 99/68, pulse 82, temperature 97.6 F (36.4 C), temperature source Oral, resp. rate 18, SpO2 100%. There is no height or weight on file to calculate BMI.  Demographic Factors:  Male, Living alone, and Unemployed  Loss Factors: Decrease in vocational status, Decline in physical health, and Financial problems/change in socioeconomic status  Historical Factors: Prior suicide attempts, Family history of mental illness or substance abuse, and Impulsivity  Risk Reduction Factors:   Sense of responsibility to family  Continued Clinical Symptoms:  Depression:    Anhedonia Comorbid alcohol abuse/dependence Insomnia Alcohol/Substance Abuse/Dependencies More than one psychiatric diagnosis  Cognitive Features That Contribute To Risk:  None    Suicide Risk:  Mild:  There are no identifiable suicide plans, no associated intent, mild dysphoria and related symptoms, good self-control (both objective and subjective assessment), few other risk factors, and identifiable protective factors, including available and accessible social support.  Plan Of Care/Follow-up recommendations:  Activity:  As tolerated Diet:  Normal Tests:  Follow-up on liver function testing, consider stopping naltrexone with worsening and recommend involvement of hepatologist with worsening  Disposition: [facility]  Meryl Dare, MD 10/19/2022, 8:27 AM

## 2022-10-19 NOTE — Discharge Summary (Signed)
[name] to be D/C'd  Floydene Flock  per MD order. Discussed with the patient and all questions fully answered. An After Visit Summary was printed and given to the patient. Medication samples and scripts were also given to patient. Patient escorted out and D/C home via safe transport.  Dickie La  10/19/2022 9:29 AM

## 2022-11-15 ENCOUNTER — Ambulatory Visit: Payer: BC Managed Care – PPO | Admitting: Physician Assistant

## 2023-01-20 ENCOUNTER — Encounter (HOSPITAL_COMMUNITY): Payer: Self-pay | Admitting: Behavioral Health

## 2023-01-20 ENCOUNTER — Other Ambulatory Visit: Payer: Self-pay

## 2023-01-20 ENCOUNTER — Inpatient Hospital Stay (HOSPITAL_COMMUNITY)
Admission: EM | Admit: 2023-01-20 | Discharge: 2023-01-23 | DRG: 433 | Disposition: A | Discharge status: Home / Self Care | Payer: Self-pay | Attending: Internal Medicine | Admitting: Internal Medicine

## 2023-01-20 ENCOUNTER — Emergency Department (HOSPITAL_COMMUNITY): Payer: Self-pay

## 2023-01-20 ENCOUNTER — Encounter (HOSPITAL_COMMUNITY): Payer: Self-pay | Admitting: Emergency Medicine

## 2023-01-20 ENCOUNTER — Ambulatory Visit (HOSPITAL_COMMUNITY)
Admission: EM | Admit: 2023-01-20 | Discharge: 2023-01-20 | Disposition: A | Discharge status: Other Acute Inpatient Hospital | Payer: No Payment, Other | Attending: Behavioral Health | Admitting: Behavioral Health

## 2023-01-20 DIAGNOSIS — F332 Major depressive disorder, recurrent severe without psychotic features: Secondary | ICD-10-CM | POA: Diagnosis present

## 2023-01-20 DIAGNOSIS — F1090 Alcohol use, unspecified, uncomplicated: Secondary | ICD-10-CM

## 2023-01-20 DIAGNOSIS — F331 Major depressive disorder, recurrent, moderate: Secondary | ICD-10-CM | POA: Insufficient documentation

## 2023-01-20 DIAGNOSIS — F17220 Nicotine dependence, chewing tobacco, uncomplicated: Secondary | ICD-10-CM | POA: Diagnosis present

## 2023-01-20 DIAGNOSIS — E039 Hypothyroidism, unspecified: Secondary | ICD-10-CM | POA: Diagnosis present

## 2023-01-20 DIAGNOSIS — J45909 Unspecified asthma, uncomplicated: Secondary | ICD-10-CM | POA: Diagnosis present

## 2023-01-20 DIAGNOSIS — Z91148 Patient's other noncompliance with medication regimen for other reason: Secondary | ICD-10-CM

## 2023-01-20 DIAGNOSIS — F1020 Alcohol dependence, uncomplicated: Secondary | ICD-10-CM | POA: Insufficient documentation

## 2023-01-20 DIAGNOSIS — E669 Obesity, unspecified: Secondary | ICD-10-CM | POA: Insufficient documentation

## 2023-01-20 DIAGNOSIS — G479 Sleep disorder, unspecified: Secondary | ICD-10-CM | POA: Insufficient documentation

## 2023-01-20 DIAGNOSIS — Z556 Problems related to health literacy: Secondary | ICD-10-CM | POA: Insufficient documentation

## 2023-01-20 DIAGNOSIS — D696 Thrombocytopenia, unspecified: Secondary | ICD-10-CM | POA: Insufficient documentation

## 2023-01-20 DIAGNOSIS — E860 Dehydration: Secondary | ICD-10-CM | POA: Insufficient documentation

## 2023-01-20 DIAGNOSIS — E785 Hyperlipidemia, unspecified: Secondary | ICD-10-CM | POA: Insufficient documentation

## 2023-01-20 DIAGNOSIS — D6959 Other secondary thrombocytopenia: Secondary | ICD-10-CM | POA: Diagnosis present

## 2023-01-20 DIAGNOSIS — K7010 Alcoholic hepatitis without ascites: Principal | ICD-10-CM | POA: Diagnosis present

## 2023-01-20 DIAGNOSIS — Z9151 Personal history of suicidal behavior: Secondary | ICD-10-CM

## 2023-01-20 DIAGNOSIS — R7303 Prediabetes: Secondary | ICD-10-CM | POA: Diagnosis present

## 2023-01-20 DIAGNOSIS — Z79899 Other long term (current) drug therapy: Secondary | ICD-10-CM | POA: Insufficient documentation

## 2023-01-20 DIAGNOSIS — E871 Hypo-osmolality and hyponatremia: Secondary | ICD-10-CM | POA: Diagnosis present

## 2023-01-20 DIAGNOSIS — G4700 Insomnia, unspecified: Secondary | ICD-10-CM | POA: Diagnosis present

## 2023-01-20 DIAGNOSIS — E781 Pure hyperglyceridemia: Secondary | ICD-10-CM | POA: Insufficient documentation

## 2023-01-20 DIAGNOSIS — E782 Mixed hyperlipidemia: Secondary | ICD-10-CM | POA: Diagnosis present

## 2023-01-20 DIAGNOSIS — Z79890 Hormone replacement therapy (postmenopausal): Secondary | ICD-10-CM

## 2023-01-20 DIAGNOSIS — Z560 Unemployment, unspecified: Secondary | ICD-10-CM

## 2023-01-20 DIAGNOSIS — Z62810 Personal history of physical and sexual abuse in childhood: Secondary | ICD-10-CM | POA: Insufficient documentation

## 2023-01-20 DIAGNOSIS — D649 Anemia, unspecified: Secondary | ICD-10-CM | POA: Diagnosis present

## 2023-01-20 LAB — ACETAMINOPHEN LEVEL: Acetaminophen (Tylenol), Serum: <10 ug/mL — ABNORMAL LOW (ref 10–30)

## 2023-01-20 LAB — CBC WITH DIFFERENTIAL/PLATELET
Abs Immature Granulocytes: 0.01 10*3/uL (ref 0.00–0.07)
Abs Immature Granulocytes: 0.02 10*3/uL (ref 0.00–0.07)
Basophils Absolute: 0.1 10*3/uL (ref 0.0–0.1)
Basophils Absolute: 0.1 10*3/uL (ref 0.0–0.1)
Basophils Relative: 1 %
Basophils Relative: 1 %
Eosinophils Absolute: 0 10*3/uL (ref 0.0–0.5)
Eosinophils Absolute: 0.1 10*3/uL (ref 0.0–0.5)
Eosinophils Relative: 1 %
Eosinophils Relative: 1 %
HCT: 35.3 % — ABNORMAL LOW (ref 39.0–52.0)
HCT: 35 % — ABNORMAL LOW (ref 39.0–52.0)
Hemoglobin: 12.5 g/dL — ABNORMAL LOW (ref 13.0–17.0)
Hemoglobin: 12.3 g/dL — ABNORMAL LOW (ref 13.0–17.0)
Immature Granulocytes: 0 %
Immature Granulocytes: 0 %
Lymphocytes Relative: 26 %
Lymphocytes Relative: 28 %
Lymphs Abs: 1.5 10*3/uL (ref 0.7–4.0)
Lymphs Abs: 1.7 10*3/uL (ref 0.7–4.0)
MCH: 29 pg (ref 26.0–34.0)
MCH: 29 pg (ref 26.0–34.0)
MCHC: 35.4 g/dL (ref 30.0–36.0)
MCHC: 35.1 g/dL (ref 30.0–36.0)
MCV: 81.9 fL (ref 80.0–100.0)
MCV: 82.5 fL (ref 80.0–100.0)
Monocytes Absolute: 0.5 10*3/uL (ref 0.1–1.0)
Monocytes Absolute: 0.4 10*3/uL (ref 0.1–1.0)
Monocytes Relative: 8 %
Monocytes Relative: 7 %
Neutro Abs: 3.7 10*3/uL (ref 1.7–7.7)
Neutro Abs: 3.8 10*3/uL (ref 1.7–7.7)
Neutrophils Relative %: 64 %
Neutrophils Relative %: 63 %
Platelets: 72 10*3/uL — ABNORMAL LOW (ref 150–400)
Platelets: 71 10*3/uL — ABNORMAL LOW (ref 150–400)
RBC: 4.31 MIL/uL (ref 4.22–5.81)
RBC: 4.24 MIL/uL (ref 4.22–5.81)
RDW: 19.9 % — ABNORMAL HIGH (ref 11.5–15.5)
RDW: 20.2 % — ABNORMAL HIGH (ref 11.5–15.5)
WBC: 5.8 10*3/uL (ref 4.0–10.5)
WBC: 6 10*3/uL (ref 4.0–10.5)
nRBC: 0 % (ref 0.0–0.2)
nRBC: 0 % (ref 0.0–0.2)

## 2023-01-20 LAB — HIV ANTIBODY (ROUTINE TESTING W REFLEX): HIV Screen 4th Generation wRfx: NONREACTIVE

## 2023-01-20 LAB — PROTIME-INR
INR: 1.4 — ABNORMAL HIGH (ref 0.8–1.2)
Prothrombin Time: 17.5 s — ABNORMAL HIGH (ref 11.4–15.2)

## 2023-01-20 LAB — COMPREHENSIVE METABOLIC PANEL
ALT: 102 U/L — ABNORMAL HIGH (ref 0–44)
ALT: 97 U/L — ABNORMAL HIGH (ref 0–44)
AST: 177 U/L — ABNORMAL HIGH (ref 15–41)
AST: 168 U/L — ABNORMAL HIGH (ref 15–41)
Albumin: 3.4 g/dL — ABNORMAL LOW (ref 3.5–5.0)
Albumin: 3.2 g/dL — ABNORMAL LOW (ref 3.5–5.0)
Alkaline Phosphatase: 328 U/L — ABNORMAL HIGH (ref 38–126)
Alkaline Phosphatase: 301 U/L — ABNORMAL HIGH (ref 38–126)
Anion gap: 10 (ref 5–15)
Anion gap: 13 (ref 5–15)
BUN: 10 mg/dL (ref 6–20)
BUN: 8 mg/dL (ref 6–20)
CO2: 29 mmol/L (ref 22–32)
CO2: 25 mmol/L (ref 22–32)
Calcium: 8.7 mg/dL — ABNORMAL LOW (ref 8.9–10.3)
Calcium: 8.6 mg/dL — ABNORMAL LOW (ref 8.9–10.3)
Chloride: 93 mmol/L — ABNORMAL LOW (ref 98–111)
Chloride: 94 mmol/L — ABNORMAL LOW (ref 98–111)
Creatinine, Ser: 0.98 mg/dL (ref 0.61–1.24)
Creatinine, Ser: 0.97 mg/dL (ref 0.61–1.24)
GFR, Estimated: >60 mL/min (ref >60)
GFR, Estimated: >60 mL/min (ref >60)
Glucose, Bld: 118 mg/dL — ABNORMAL HIGH (ref 70–99)
Glucose, Bld: 113 mg/dL — ABNORMAL HIGH (ref 70–99)
Potassium: 3.4 mmol/L — ABNORMAL LOW (ref 3.5–5.1)
Potassium: 3.7 mmol/L (ref 3.5–5.1)
Sodium: 132 mmol/L — ABNORMAL LOW (ref 135–145)
Sodium: 132 mmol/L — ABNORMAL LOW (ref 135–145)
Total Bilirubin: 8.3 mg/dL — ABNORMAL HIGH (ref 0.0–1.2)
Total Bilirubin: 8.6 mg/dL — ABNORMAL HIGH (ref 0.0–1.2)
Total Protein: 8.3 g/dL — ABNORMAL HIGH (ref 6.5–8.1)
Total Protein: 7.8 g/dL (ref 6.5–8.1)

## 2023-01-20 LAB — HEPATITIS PANEL, ACUTE
HCV Ab: NONREACTIVE
Hep A IgM: NONREACTIVE
Hep B C IgM: NONREACTIVE
Hepatitis B Surface Ag: NONREACTIVE

## 2023-01-20 LAB — POCT URINE DRUG SCREEN - MANUAL ENTRY (I-SCREEN)
POC Amphetamine UR: NOT DETECTED
POC Buprenorphine (BUP): NOT DETECTED
POC Cocaine UR: NOT DETECTED
POC Marijuana UR: NOT DETECTED
POC Methadone UR: NOT DETECTED
POC Methamphetamine UR: NOT DETECTED
POC Morphine: NOT DETECTED
POC Oxazepam (BZO): NOT DETECTED
POC Oxycodone UR: NOT DETECTED
POC Secobarbital (BAR): NOT DETECTED

## 2023-01-20 LAB — PHOSPHORUS: Phosphorus: 4.8 mg/dL — ABNORMAL HIGH (ref 2.5–4.6)

## 2023-01-20 LAB — LIPID PANEL
Cholesterol: 258 mg/dL — ABNORMAL HIGH (ref 0–200)
HDL: <10 mg/dL — ABNORMAL LOW (ref >40)
LDL Cholesterol: UNDETERMINED mg/dL (ref 0–99)
Triglycerides: 655 mg/dL — ABNORMAL HIGH (ref <150)
VLDL: UNDETERMINED mg/dL (ref 0–40)

## 2023-01-20 LAB — LACTATE DEHYDROGENASE: LDH: 239 U/L — ABNORMAL HIGH (ref 98–192)

## 2023-01-20 LAB — ETHANOL: Alcohol, Ethyl (B): 88 mg/dL — ABNORMAL HIGH (ref <10)

## 2023-01-20 LAB — HEMOGLOBIN A1C
Hgb A1c MFr Bld: 6.3 % — ABNORMAL HIGH (ref 4.8–5.6)
Mean Plasma Glucose: 134.11 mg/dL

## 2023-01-20 LAB — BILIRUBIN, FRACTIONATED(TOT/DIR/INDIR)
Bilirubin, Direct: 5.3 mg/dL — ABNORMAL HIGH (ref 0.0–0.2)
Indirect Bilirubin: 3.1 mg/dL — ABNORMAL HIGH (ref 0.3–0.9)
Total Bilirubin: 8.4 mg/dL — ABNORMAL HIGH (ref 0.0–1.2)

## 2023-01-20 LAB — LIPASE, BLOOD: Lipase: 29 U/L (ref 11–51)

## 2023-01-20 LAB — LDL CHOLESTEROL, DIRECT: Direct LDL: 152 mg/dL — ABNORMAL HIGH (ref 0–99)

## 2023-01-20 LAB — TSH
TSH: 200.651 u[IU]/mL — ABNORMAL HIGH (ref 0.350–4.500)
TSH: 183.194 u[IU]/mL — ABNORMAL HIGH (ref 0.350–4.500)

## 2023-01-20 LAB — MAGNESIUM: Magnesium: 2.1 mg/dL (ref 1.7–2.4)

## 2023-01-20 LAB — T4, FREE: Free T4: <0.25 ng/dL — ABNORMAL LOW (ref 0.61–1.12)

## 2023-01-20 LAB — AMMONIA: Ammonia: 38 umol/L — ABNORMAL HIGH (ref 9–35)

## 2023-01-20 MED ORDER — ACETAMINOPHEN 650 MG RE SUPP: 650.0000 mg | Freq: Four times a day (QID) | RECTAL | Status: DC | PRN

## 2023-01-20 MED ORDER — LORAZEPAM 2 MG/ML IJ SOLN: 0.0000 mg | Freq: Two times a day (BID) | INTRAMUSCULAR | Status: DC

## 2023-01-20 MED ORDER — PREDNISOLONE 5 MG PO TABS: 40.0000 mg | ORAL_TABLET | Freq: Every day | ORAL | Status: DC

## 2023-01-20 MED ORDER — POLYETHYLENE GLYCOL 3350 17 G PO PACK: 17.0000 g | PACK | Freq: Every day | ORAL | Status: DC | PRN

## 2023-01-20 MED ORDER — THIAMINE MONONITRATE 100 MG PO TABS
100.0000 mg | ORAL_TABLET | Freq: Every day | ORAL | Status: DC
  Administered 2023-01-21 – 2023-01-23 (×3): 100 mg via ORAL
  Filled 2023-01-20 (×3): qty 1

## 2023-01-20 MED ORDER — FOLIC ACID 1 MG PO TABS
1.0000 mg | ORAL_TABLET | Freq: Every day | ORAL | Status: DC
  Administered 2023-01-20 – 2023-01-23 (×4): 1 mg via ORAL
  Filled 2023-01-20 (×4): qty 1

## 2023-01-20 MED ORDER — MAGNESIUM HYDROXIDE 400 MG/5ML PO SUSP: 30.0000 mL | Freq: Every day | ORAL | Status: DC | PRN

## 2023-01-20 MED ORDER — LEVOTHYROXINE SODIUM 100 MCG PO TABS
200.0000 ug | ORAL_TABLET | Freq: Every day | ORAL | Status: DC
  Administered 2023-01-21 – 2023-01-23 (×3): 200 ug via ORAL
  Filled 2023-01-20 (×3): qty 2

## 2023-01-20 MED ORDER — ALUM & MAG HYDROXIDE-SIMETH 200-200-20 MG/5ML PO SUSP: 30.0000 mL | ORAL | Status: DC | PRN

## 2023-01-20 MED ORDER — ACETAMINOPHEN 325 MG PO TABS: 650.0000 mg | ORAL_TABLET | Freq: Four times a day (QID) | ORAL | Status: DC | PRN

## 2023-01-20 MED ORDER — TRAZODONE HCL 50 MG PO TABS: 50.0000 mg | ORAL_TABLET | Freq: Every evening | ORAL | Status: DC | PRN

## 2023-01-20 MED ORDER — LACTATED RINGERS IV BOLUS
1000.0000 mL | Freq: Once | INTRAVENOUS | Status: AC
  Administered 2023-01-20: 1000 mL via INTRAVENOUS

## 2023-01-20 MED ORDER — HYDROXYZINE HCL 25 MG PO TABS: 50.0000 mg | ORAL_TABLET | Freq: Three times a day (TID) | ORAL | Status: DC | PRN

## 2023-01-20 MED ORDER — PREDNISOLONE 5 MG PO TABS
40.0000 mg | ORAL_TABLET | Freq: Every day | ORAL | Status: DC
  Administered 2023-01-21 – 2023-01-23 (×3): 40 mg via ORAL
  Filled 2023-01-20 (×4): qty 8

## 2023-01-20 MED ORDER — VITAMIN B-12 1000 MCG PO TABS
1000.0000 ug | ORAL_TABLET | Freq: Every day | ORAL | Status: DC
  Administered 2023-01-21 – 2023-01-23 (×3): 1000 ug via ORAL
  Filled 2023-01-20 (×3): qty 1

## 2023-01-20 MED ORDER — ALBUTEROL SULFATE (2.5 MG/3ML) 0.083% IN NEBU: 2.5000 mL | INHALATION_SOLUTION | Freq: Four times a day (QID) | RESPIRATORY_TRACT | Status: DC | PRN

## 2023-01-20 MED ORDER — LORAZEPAM 2 MG/ML IJ SOLN: 0.0000 mg | Freq: Four times a day (QID) | INTRAMUSCULAR | Status: AC

## 2023-01-20 MED ORDER — LORAZEPAM 1 MG PO TABS: 0.0000 mg | ORAL_TABLET | Freq: Four times a day (QID) | ORAL | Status: AC

## 2023-01-20 MED ORDER — QUETIAPINE FUMARATE 25 MG PO TABS
50.0000 mg | ORAL_TABLET | Freq: Every day | ORAL | Status: DC
  Administered 2023-01-20 – 2023-01-22 (×3): 50 mg via ORAL
  Filled 2023-01-20 (×3): qty 2

## 2023-01-20 MED ORDER — SERTRALINE HCL 100 MG PO TABS
100.0000 mg | ORAL_TABLET | Freq: Every day | ORAL | Status: DC
  Administered 2023-01-21 – 2023-01-23 (×3): 100 mg via ORAL
  Filled 2023-01-20 (×3): qty 1

## 2023-01-20 MED ORDER — THIAMINE HCL 100 MG/ML IJ SOLN
100.0000 mg | Freq: Every day | INTRAMUSCULAR | Status: DC
  Administered 2023-01-20: 100 mg via INTRAVENOUS
  Filled 2023-01-20: qty 2

## 2023-01-20 MED ORDER — LORAZEPAM 1 MG PO TABS: 0.0000 mg | ORAL_TABLET | Freq: Two times a day (BID) | ORAL | Status: DC
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
## 2023-01-20 NOTE — Discharge Instructions (Signed)
Transfer to [facility] ED

## 2023-01-20 NOTE — H&P (Addendum)
 Date: 01/20/2023               Patient Name:  Danny West MRN: [id number]  DOB: 1976-01-01 Age / Sex: 48 y.o., male   PCP: Pcp, No         Medical Service: Internal Medicine Teaching Service         Attending Physician: Dr. Karna Fellows, MD      First Contact: Dr. Signe Arabian, MD Pager [phone]    Second Contact: Dr. Roetta Chars, MD Pager [phone]         After Hours (After 5p/  First Contact Pager: [phone]  weekends / holidays): Second Contact Pager: [phone]   SUBJECTIVE   Chief Complaint: Malaise, abnormal LFTs  History of Present Illness:  This is a 48 year old person with pertinent past medical history of asthma, MDD, hypothyroidism, prediabetes, and bulging disc who presented to the emergency department for evaluation following abnormal lab values found at behavioral health center.  The patient states that they saw their therapist recently who noticed they were more jaundiced.  During this time they endorse increased fatigue and malaise.  They were instructed to get evaluated behavioral health, which time they are found to have abnormal lab values were sent to the ED.  The patient states that they have a long history of alcohol abuse.  They state they were drinking about 14 standard drinks worth of alcohol a day, but had some increased nausea and malaise over the last week which prompted him to cut back on the drinking.  They also noticed that there urine was darker, and their stools were slightly more irregular and gray in color.  This prompted them to cut back on the drinking to about 5-6 standard drinks a day.  Last drink earlier today.  They deny vomiting, hematemesis, chest pain, shortness of breath, abdominal pain, right upper quadrant tenderness, edema, AVH, SI, or HI.  Medications: Zoloft  100 mg daily, often misses doses Synthroid  200 mcg daily, states he has not had this medication in some weeks Seroquel  50 mg as needed nightly  ED Course: Vital signs within normal  limits Lab work demonstrating normocytic anemia, thrombocytopenia to 71 Hyponatremia to 132, trivial hypocalcemia and hypoalbuminemia AST and ALT elevations with AST 168, ALT 97-slightly downtrending from labs earlier today. Alk phos elevated at 301, total bili elevated at 8.6 slightly increased from 8.3 earlier today. HbA1c 6.3 Mag normal at 2.1 Lipase within normal limits Ammonia elevated to 38 TSH 183, downtrending from 200 earlier today Free T4 less than 0.25 RUQ US  w/o acute findings  Meds:  Current Meds  Medication Sig   albuterol  (VENTOLIN  HFA) 108 (90 Base) MCG/ACT inhaler Inhale 1 puff into the lungs every 6 (six) hours as needed for wheezing or shortness of breath.   levothyroxine  (SYNTHROID ) 200 MCG tablet Take 1 tablet (200 mcg total) by mouth daily before breakfast.   Multiple Vitamin (MULTIVITAMIN WITH MINERALS) TABS tablet Take 1 tablet by mouth daily.   Omega-3 Fatty Acids (FISH OIL) 1000 MG CAPS Take 1 capsule by mouth daily.   QUEtiapine  (SEROQUEL ) 50 MG tablet Take 1 tablet (50 mg total) by mouth at bedtime.   sertraline  (ZOLOFT ) 100 MG tablet Take 1 tablet (100 mg total) by mouth daily.    Past Medical History  Past Surgical History:  Procedure Laterality Date   NO PAST SURGERIES      Social:  Lives With: Himself independently Occupation: Unemployed currently Support: 2 twin boys who are his source of  motivation Level of Function: Independent PCP: None Substances: Extensive alcohol use history Currently uses smokeless tobacco, One tin of dip last 3 to 4 days. Denies any other drugs  Allergies: Allergies as of 01/20/2023   (No Known Allergies)    Review of Systems: Please see HPI and A&P for pertinent positives and negatives.   OBJECTIVE:   Physical Exam: Blood pressure 135/87, pulse 79, temperature 98.3 F (36.8 C), resp. rate 18, SpO2 99%.  Constitutional: well-appearing, in no acute distress Cardiovascular: regular rate and rhythm, no  m/r/g Pulmonary/Chest: normal work of breathing on room air, lungs clear to auscultation bilaterally Abdominal: soft, non-tender, mildly-distended Neurological: alert & oriented x 3, No asterixis Skin: warm and dry. Jaundice present Psych: Pleasant affect, linear thought process   Labs: CBC    Component Value Date/Time   WBC 6.0 01/20/2023 1824   RBC 4.24 01/20/2023 1824   HGB 12.3 (L) 01/20/2023 1824   HCT 35.0 (L) 01/20/2023 1824   PLT 71 (L) 01/20/2023 1824   MCV 82.5 01/20/2023 1824   MCH 29.0 01/20/2023 1824   MCHC 35.1 01/20/2023 1824   RDW 20.2 (H) 01/20/2023 1824   LYMPHSABS 1.7 01/20/2023 1824   MONOABS 0.4 01/20/2023 1824   EOSABS 0.1 01/20/2023 1824   BASOSABS 0.1 01/20/2023 1824     CMP     Component Value Date/Time   NA 132 (L) 01/20/2023 1824   K 3.7 01/20/2023 1824   CL 94 (L) 01/20/2023 1824   CO2 25 01/20/2023 1824   GLUCOSE 113 (H) 01/20/2023 1824   BUN 8 01/20/2023 1824   CREATININE 0.97 01/20/2023 1824   CALCIUM 8.6 (L) 01/20/2023 1824   PROT 7.8 01/20/2023 1824   PROT 6.2 09/21/2021 1111   ALBUMIN 3.2 (L) 01/20/2023 1824   ALBUMIN 4.2 09/21/2021 1111   AST 168 (H) 01/20/2023 1824   ALT 97 (H) 01/20/2023 1824   ALKPHOS 301 (H) 01/20/2023 1824   BILITOT 8.6 (H) 01/20/2023 1824   BILITOT 0.4 09/21/2021 1111   GFRNONAA >60 01/20/2023 1824    Imaging:  RUQ US :  Gallbladder sludge without acute findings  EKG: personally reviewed my interpretation is no acute findings. Prior EKG same.  ASSESSMENT & PLAN:   Assessment & Plan by Problem: Principal Problem:   Alcoholic hepatitis without ascites Active Problems:   Alcohol use disorder, severe, dependence (HCC)   Danny West is a 48 y.o. person living with a This is a 48 year old person with pertinent past medical history of asthma, MDD, hypothyroidism, prediabetes, and bulging disc who presented to the emergency department for evaluation following abnormal lab values and is admitted for  hypothyroidism and alcoholic hepatitis on hospital day 0  Alcoholic hepatopathy Alcohol use disorder Elevated LFTs Thrombocytopenia This gentleman presenting with presumed alcoholic hepatitis, with T. bili elevated to 8.3, AST greater than ALT. Right upper quadrant ultrasound did not demonstrate significant ascites or cirrhotic morphology of the liver.  No gallstones identified.  No duct dilation to suggest obstruction.  Thrombocytopenia likely in the setting of hepatopathy and alcohol use, though combination of anemia, thrombocytopenia, and increased bilirubin may reflect microangiopathic hemolytic process.  No encephalopathy or asterixis to suggest acute liver failure.  Plan: - Trend hepatic function panel - Folate acid - B12 -Thiamine  - CIWA with Ativan  - PT/INR to better assess synthetic function - GGT - Direct bili - LDH and haptoglobin to rule out hemolytic process  Addendum: PT 17.5, INR 1.4. Now able to calculate risk scores.  Madrey score  36.5, will begin prednisolone  40mg  daily x 7 days, followed by [facility] score calculation to determine utility of steroid therapy moving forward.  Hypothyroidism Patient states he has not been taking his medications for some weeks.  TSH greatly elevated, free T4 greatly reduced. Plan: - Resume home synthroid    Prediabetes Newly found to have prediabetes, A1c 6.3.  Benefit from outpatient follow-up, lifestyle modification. Plan: - Will begin CBG checks as patient with receive steroids.   #Mixed hyperlipidemia Mixed hyperlipidemia, would benefit from lifestyle modification.  Will hold off on starting statin at current time due to active hepatopathy  #MDD Denies suicidal or homicidal ideation at this time. States that his children are a major source of motivation for him to live. Plan: - Continue home Zoloft   #Insomnia Plan: - Continue home Seroquel  as needed nightly  #Asthma -As needed albuterol   Diet: Normal VTE: SCDs IVF:  None,None Code: Full  Prior to Admission Living Arrangement: Home, living alone Anticipated Discharge Location: Home Barriers to Discharge: clinical improvement  Dispo: Admit patient to Observation with expected length of stay less than 2 midnights.  Signed: Gabino Boga, MD Internal Medicine Resident PGY-1  01/20/2023, 9:52 PM

## 2023-01-20 NOTE — ED Provider Notes (Signed)
 [facility] EMERGENCY DEPARTMENT AT [facility] Provider Note   CSN: [id number] Arrival date & time: 01/20/23  1751     History  Chief Complaint  Patient presents with   Abnormal Labs    Danny West is a 48 y.o. male.  48 year old male presents today for concern of abnormal labs from behavior health urgent care.  He went there to request rehab/detox from alcohol.  His last drink was this morning.  He went to see his therapist yesterday where he was discovered to be jaundiced.  He went to behavioral urgent care for detox and they drew lab work which was concerning so they sent him to the emergency department.  He denies any abdominal pain.  Denies nausea, vomiting.  He feels he is dehydrated.  The history is provided by the patient. No language interpreter was used.       Home Medications Prior to Admission medications   Medication Sig Start Date End Date Taking? Authorizing Provider  albuterol  (VENTOLIN  HFA) 108 (90 Base) MCG/ACT inhaler Inhale 1 puff into the lungs every 6 (six) hours as needed for wheezing or shortness of breath.   Yes [provider]  levothyroxine  (SYNTHROID ) 200 MCG tablet Take 1 tablet (200 mcg total) by mouth daily before breakfast. 10/19/22  Yes McCarty, Artie, MD  Multiple Vitamin (MULTIVITAMIN WITH MINERALS) TABS tablet Take 1 tablet by mouth daily.   Yes [provider]  Omega-3 Fatty Acids (FISH OIL) 1000 MG CAPS Take 1 capsule by mouth daily.   Yes [provider]  QUEtiapine  (SEROQUEL ) 50 MG tablet Take 1 tablet (50 mg total) by mouth at bedtime. 10/19/22  Yes Cornelius Dines, MD  sertraline  (ZOLOFT ) 100 MG tablet Take 1 tablet (100 mg total) by mouth daily. 10/19/22  Yes Cornelius Dines, MD      Allergies    Patient has no known allergies.    Review of Systems   Review of Systems  Constitutional:  Negative for chills and fever.  Respiratory:  Negative for shortness of breath.   Gastrointestinal:  Negative for  abdominal pain, nausea and vomiting.  Neurological:  Negative for light-headedness.  All other systems reviewed and are negative.   Physical Exam Updated Vital Signs BP 135/87   Pulse 79   Temp 98.3 F (36.8 C)   Resp 18   SpO2 99%  Physical Exam Vitals and nursing note reviewed.  Constitutional:      General: He is not in acute distress.    Appearance: Normal appearance. He is not ill-appearing.  HENT:     Head: Normocephalic and atraumatic.     Nose: Nose normal.  Eyes:     General: Scleral icterus present.     Conjunctiva/sclera: Conjunctivae normal.  Cardiovascular:     Rate and Rhythm: Normal rate and regular rhythm.  Pulmonary:     Effort: Pulmonary effort is normal. No respiratory distress.  Abdominal:     General: There is no distension.     Palpations: Abdomen is soft.     Tenderness: There is no abdominal tenderness. There is no guarding.  Musculoskeletal:        General: No deformity. Normal range of motion.     Cervical back: Normal range of motion.     Comments: Without asterixis.   Skin:    Findings: No rash.  Neurological:     General: No focal deficit present.     Mental Status: He is alert and oriented to person, place, and  time. Mental status is at baseline.     Cranial Nerves: No cranial nerve deficit.     Motor: No weakness.     ED Results / Procedures / Treatments   Labs (all labs ordered are listed, but only abnormal results are displayed) Labs Reviewed  CBC WITH DIFFERENTIAL/PLATELET - Abnormal; Notable for the following components:      Result Value   Hemoglobin 12.3 (*)    HCT 35.0 (*)    RDW 20.2 (*)    Platelets 71 (*)    All other components within normal limits  COMPREHENSIVE METABOLIC PANEL - Abnormal; Notable for the following components:   Sodium 132 (*)    Chloride 94 (*)    Glucose, Bld 113 (*)    Calcium 8.6 (*)    Albumin 3.2 (*)    AST 168 (*)    ALT 97 (*)    Alkaline Phosphatase 301 (*)    Total Bilirubin 8.6 (*)     All other components within normal limits  AMMONIA - Abnormal; Notable for the following components:   Ammonia 38 (*)    All other components within normal limits  TSH - Abnormal; Notable for the following components:   TSH 183.194 (*)    All other components within normal limits  T4, FREE - Abnormal; Notable for the following components:   Free T4 <0.25 (*)    All other components within normal limits  ACETAMINOPHEN  LEVEL - Abnormal; Notable for the following components:   Acetaminophen  (Tylenol ), Serum <10 (*)    All other components within normal limits  LIPASE, BLOOD    EKG None  Radiology US  Abdomen Limited RUQ (LIVER/GB) Result Date: 01/20/2023 CLINICAL DATA:  Abdominal pain EXAM: ULTRASOUND ABDOMEN LIMITED RIGHT UPPER QUADRANT COMPARISON:  None Available. FINDINGS: Gallbladder: Physiologically distended with some layering sludge. No gallstones or wall thickening visualized. No sonographic Murphy sign noted by sonographer. Common bile duct: Diameter: 3.2 mm. No intrahepatic biliary ductal dilatation identified. Liver: No focal lesion identified. Within normal limits in parenchymal echogenicity. Portal vein is patent on color Doppler imaging with normal direction of blood flow towards the liver. Other: None. IMPRESSION: Gallbladder sludge. No acute findings. Electronically Signed   By: JONETTA Faes M.D.   On: 01/20/2023 19:51    Procedures Procedures    Medications Ordered in ED Medications  LORazepam  (ATIVAN ) injection 0-4 mg ( Intravenous Not Given 01/20/23 2029)    Or  LORazepam  (ATIVAN ) tablet 0-4 mg ( Oral See Alternative 01/20/23 2029)  LORazepam  (ATIVAN ) injection 0-4 mg (has no administration in time range)    Or  LORazepam  (ATIVAN ) tablet 0-4 mg (has no administration in time range)  thiamine  (VITAMIN B1) tablet 100 mg ( Oral See Alternative 01/20/23 2033)    Or  thiamine  (VITAMIN B1) injection 100 mg (100 mg Intravenous Given 01/20/23 2033)  lactated ringers  bolus 1,000  mL (1,000 mLs Intravenous New Bag/Given 01/20/23 2033)    ED Course/ Medical Decision Making/ A&P                                 Medical Decision Making Amount and/or Complexity of Data Reviewed Labs: ordered. Radiology: ordered.  Risk OTC drugs. Prescription drug management.   Medical Decision Making / ED Course   This patient presents to the ED for concern of abnormal labs, this involves an extensive number of treatment options, and is a complaint that carries with  it a high risk of complications and morbidity.  The differential diagnosis includes hyperbilirubinemia, biliary obstruction, withdrawals  MDM: 48 year old male presents today for concern of abnormal labs from behavioral health urgent care.  His TSH is significantly elevated.  T4 is undetectable. He states he has not been taking his medications for the past 2 weeks.  Including his Synthroid .  His last drink was earlier today.  He does endorse significant drinking.  CBC is without leukocytosis.  Mild anemia noted.  CMP with glucose of 113, sodium 132, however LFTs significantly abnormal.  Total bilirubin of 8.6 which is significantly elevated from his baseline.  On exam he does have jaundice.  Alk phos of 301, AST and ALT elevated. Right upper quadrant ultrasound without acute concerns.  Will discuss with admitting team for admission due to significant elevated total bilirubin which will require trending.  Discussed with internal medicine teaching service.  They will evaluate patient for admission.   Lab Tests: -I ordered, reviewed, and interpreted labs.   The pertinent results include:   Labs Reviewed  CBC WITH DIFFERENTIAL/PLATELET - Abnormal; Notable for the following components:      Result Value   Hemoglobin 12.3 (*)    HCT 35.0 (*)    RDW 20.2 (*)    Platelets 71 (*)    All other components within normal limits  COMPREHENSIVE METABOLIC PANEL - Abnormal; Notable for the following components:   Sodium 132 (*)     Chloride 94 (*)    Glucose, Bld 113 (*)    Calcium 8.6 (*)    Albumin 3.2 (*)    AST 168 (*)    ALT 97 (*)    Alkaline Phosphatase 301 (*)    Total Bilirubin 8.6 (*)    All other components within normal limits  AMMONIA - Abnormal; Notable for the following components:   Ammonia 38 (*)    All other components within normal limits  TSH - Abnormal; Notable for the following components:   TSH 183.194 (*)    All other components within normal limits  T4, FREE - Abnormal; Notable for the following components:   Free T4 <0.25 (*)    All other components within normal limits  ACETAMINOPHEN  LEVEL - Abnormal; Notable for the following components:   Acetaminophen  (Tylenol ), Serum <10 (*)    All other components within normal limits  LIPASE, BLOOD      EKG  EKG Interpretation Date/Time:    Ventricular Rate:    PR Interval:    QRS Duration:    QT Interval:    QTC Calculation:   R Axis:      Text Interpretation:           Imaging Studies ordered: I ordered imaging studies including RUQ I independently visualized and interpreted imaging. I agree with the radiologist interpretation   Medicines ordered and prescription drug management: Meds ordered this encounter  Medications   lactated ringers  bolus 1,000 mL   OR Linked Order Group    LORazepam  (ATIVAN ) injection 0-4 mg     CIWA-AR < 5 =:   0 mg     CIWA-AR 5 -10 =:   1 mg     CIWA-AR 11 -15 =:   2 mg     CIWA-AR 16 -20 =:   3 mg     CIWA-AR 16 -20 =:   Recheck CIWA-AR in 1 hour; if > 20 notify MD     CIWA-AR > 20 =:   4  mg     CIWA-AR > 20 =:   Notify MD    LORazepam  (ATIVAN ) tablet 0-4 mg     CIWA-AR < 5 =:   0 mg     CIWA-AR 5 -10 =:   1 mg     CIWA-AR 11 -15 =:   2 mg     CIWA-AR 16 -20 =:   3 mg     CIWA-AR 16 -20 =:   Recheck CIWA-AR in 1 hour; if > 20 notify MD     CIWA-AR > 20 =:   4 mg     CIWA-AR > 20 =:   Notify MD   OR Linked Order Group    LORazepam  (ATIVAN ) injection 0-4 mg     CIWA-AR < 5 =:    0 mg     CIWA-AR 5 -10 =:   1 mg     CIWA-AR 11 -15 =:   2 mg     CIWA-AR 16 -20 =:   3 mg     CIWA-AR 16 -20 =:   Recheck CIWA-AR in 1 hour; if > 20 notify MD     CIWA-AR > 20 =:   4 mg     CIWA-AR > 20 =:   Notify MD    LORazepam  (ATIVAN ) tablet 0-4 mg     CIWA-AR < 5 =:   0 mg     CIWA-AR 5 -10 =:   1 mg     CIWA-AR 11 -15 =:   2 mg     CIWA-AR 16 -20 =:   3 mg     CIWA-AR 16 -20 =:   Recheck CIWA-AR in 1 hour; if > 20 notify MD     CIWA-AR > 20 =:   4 mg     CIWA-AR > 20 =:   Notify MD   OR Linked Order Group    thiamine  (VITAMIN B1) tablet 100 mg    thiamine  (VITAMIN B1) injection 100 mg    -I have reviewed the patients home medicines and have made adjustments as needed  Reevaluation: After the interventions noted above, I reevaluated the patient and found that they have :stayed the same  Co morbidities that complicate the patient evaluation  Past Medical History:  Diagnosis Date   Alcohol use disorder, severe, dependence (HCC) 09/13/2021   Asthma    H/O suicide attempt 09/14/2021   June 2023 - took handful of zoloft  and naltrexone  while home alone, wrote on whiteboard at home, if you're alive tomorrow, you will live for your kids. Has not mentioned this to anyone (counselor included), no psych hospitalization      Dispostion: Discussed with admitting team.  They will evaluate patient for admission.  @PCDICTATION @         Final Clinical Impression(s) / ED Diagnoses Final diagnoses:  None    Rx / DC Orders ED Discharge Orders     None         Hildegard Loge, PA-C 01/20/23 2057    Randol Simmonds, MD 01/20/23 2233

## 2023-01-20 NOTE — Hospital Course (Addendum)
#  Alcoholic hepatopathy #Alcohol use disorder #Elevated LFTs #Thrombocytopenia Presented with presumed alcoholic hepatitis.  Total bili elevated to 8.3 with AST greater than ALT.  No ascites or cirrhosis noted on right upper quadrant ultrasound.  Thrombocytopenia likely in setting of alcohol use.  Started on prednisolone  after Maddrey's score of 36.5 using PT of 17.5 and INR of 1.4.  GI consulted for further workup.  They recommended outpatient follow-up on 1/17 to calculate Lille score and assess response to steroids.  Despite last drink on 1/9, he denied any signs or symptoms of alcohol withdrawal and his CIWA scores were 0.  Prior to discharge, patient was counseled understanding of the importance of alcohol cessation to prevent progression of his symptoms.  Will follow-up outpatient with GI and message was sent to front desk to schedule follow-up with [facility].   #Hypothyroidism Noted to have had inconsistent medication use.  TSH was elevated and free T4 was reduced.  Resumed levothyroxine  200 mcg daily.  #Prediabetes A1c 6.3 in prediabetes range.  Will benefit from outpatient follow-up and lifestyle modifications.    #Mixed hyperlipidemia Likely secondary from acute hepatitis and elevated triglycerides. Held off on starting statin but recommend outpatient follow-up.  #MDD Denied SI/HI throughout stay. Followed with behavioral health.  Continued on home Zoloft  100 mg daily.  #Insomnia Continued on home quetiapine  50 mg at bedtime   #Asthma Continued on at home PRN albuterol .

## 2023-01-20 NOTE — ED Notes (Signed)
 Safe transport called to transport to [facility] d/t long wait times with non emergent EMS. Will continue to monitor and report any COC.

## 2023-01-20 NOTE — ED Notes (Signed)
 Report called to [facility] RN @ [facility]. Non emergent EMS called to transport pt. Will continue to monitor for safety and report any COC.

## 2023-01-20 NOTE — ED Notes (Signed)
 Safe transport here to transport pt to [facility]. Pt a/o, calm, cooperative, & ambulatory. Safety maintained.
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
## 2023-01-20 NOTE — ED Triage Notes (Signed)
 Pt presents after going to [facility] this morning to see about detox from alcohol.  Pt has been to rehab in the past for same.  Reports his labs were abnormal there and he was told to come to the ED>

## 2023-01-20 NOTE — ED Provider Notes (Signed)
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 10/14/2022 173 (H)  0 - 99 mg/dL Final   Comment:        Total Cholesterol/HDL:CHD Risk Coronary Heart Disease Risk Table                     Men   Women  1/2 Average Risk   3.4   3.3  Average Risk       5.0   4.4  2 X Average Risk   9.6   7.1  3  X Average Risk  23.4   11.0        Use the calculated Patient Ratio above and the CHD Risk Table to determine the patient's CHD Risk.        ATP III CLASSIFICATION (LDL):  <100     mg/dL   Optimal  899-870  mg/dL   Near or Above                    Optimal  130-159  mg/dL   Borderline  839-810  mg/dL   High  >809     mg/dL   Very High Performed at [facility], [address] [place], [place] [address]    TSH 10/14/2022 155.020 (H)  0.350 - 4.500 uIU/mL Final   Comment: Performed by a 3rd Generation assay with a functional sensitivity of <=0.01 uIU/mL. Performed at [facility], [address] [place], [place] [address]     Allergies: Patient has no known allergies.  Medications:  Facility Ordered Medications  Medication   acetaminophen  (TYLENOL ) tablet 650 mg   alum & mag hydroxide-simeth (MAALOX/MYLANTA) 200-200-20 MG/5ML suspension 30 mL   magnesium  hydroxide (MILK OF MAGNESIA) suspension 30 mL   hydrOXYzine  (ATARAX ) tablet 50 mg   traZODone  (DESYREL ) tablet 50 mg   PTA Medications  Medication Sig   nicotine  (NICODERM CQ  - DOSED IN MG/24 HOURS) 21 mg/24hr patch Place 1 patch (21 mg total) onto the skin daily.   sertraline  (ZOLOFT ) 100 MG tablet Take 1 tablet (100 mg total) by mouth daily.   levothyroxine  (SYNTHROID ) 200 MCG tablet Take 1 tablet (200 mcg total) by mouth daily before breakfast.   QUEtiapine  (SEROQUEL ) 50 MG tablet Take 1 tablet (50 mg total) by mouth at bedtime.   ibuprofen  (ADVIL ) 200 MG tablet Take 400 mg by mouth every 6 (six) hours as needed (For pain). (Patient not taking: Reported on 01/20/2023)   traZODone  (DESYREL ) 50 MG tablet Take 1 tablet (50 mg total) by mouth at bedtime as needed for sleep. (Patient not taking: Reported on 01/20/2023)   naltrexone  (DEPADE) 50 MG tablet Take 1 tablet (50 mg total) by mouth daily. (Patient not taking: Reported on 01/20/2023)     -------------------------------------------------------------------------------------------------- Lab Orders         CBC with Differential/Platelet         Comprehensive metabolic panel         Hemoglobin A1c         Ethanol         Lipid panel         TSH  POCT Urine Drug Screen - (I-Screen)    EKG --------------------------------------------------------------------------------------------------  Medical Decision Making  The patient is a 48 y.o. male presenting seeking alcohol detox, he appears jaundiced. A review of his labs revealed elevated total bilirubin (8.3), AST (177)  and ALT (102) levels. Ethyl alcohol 88.    Patient to transfer to [facility] ED via nonemergency transport. Provider Handoff given to Dr. Kommor and the provider has agreed to accept the patient. The patient appears reasonably stabilized for transfer considering the current resources, flow, and capabilities available in the UC at this time, and I doubt any other [facility] requiring further screening and/or treatment in the UC prior to transfer is present.       Recommendations  Based on my evaluation the patient appears to have an emergency medical condition for which I recommend the patient be transferred to the emergency department for further evaluation.  Blair Chiquita Hint, NP 01/20/23  3:56 PM

## 2023-01-21 ENCOUNTER — Other Ambulatory Visit: Payer: Self-pay | Admitting: *Deleted

## 2023-01-21 DIAGNOSIS — F1010 Alcohol abuse, uncomplicated: Secondary | ICD-10-CM

## 2023-01-21 DIAGNOSIS — K7010 Alcoholic hepatitis without ascites: Secondary | ICD-10-CM

## 2023-01-21 DIAGNOSIS — F1020 Alcohol dependence, uncomplicated: Secondary | ICD-10-CM

## 2023-01-21 LAB — CBC
HCT: 29.8 % — ABNORMAL LOW (ref 39.0–52.0)
Hemoglobin: 10.7 g/dL — ABNORMAL LOW (ref 13.0–17.0)
MCH: 29.2 pg (ref 26.0–34.0)
MCHC: 35.9 g/dL (ref 30.0–36.0)
MCV: 81.2 fL (ref 80.0–100.0)
Platelets: 51 10*3/uL — ABNORMAL LOW (ref 150–400)
RBC: 3.67 MIL/uL — ABNORMAL LOW (ref 4.22–5.81)
RDW: 20.6 % — ABNORMAL HIGH (ref 11.5–15.5)
WBC: 4 10*3/uL (ref 4.0–10.5)
nRBC: 0 % (ref 0.0–0.2)

## 2023-01-21 LAB — HEPATIC FUNCTION PANEL
ALT: 81 U/L — ABNORMAL HIGH (ref 0–44)
AST: 145 U/L — ABNORMAL HIGH (ref 15–41)
Albumin: 2.8 g/dL — ABNORMAL LOW (ref 3.5–5.0)
Alkaline Phosphatase: 240 U/L — ABNORMAL HIGH (ref 38–126)
Bilirubin, Direct: 4.9 mg/dL — ABNORMAL HIGH (ref 0.0–0.2)
Indirect Bilirubin: 3.3 mg/dL — ABNORMAL HIGH (ref 0.3–0.9)
Total Bilirubin: 8.2 mg/dL — ABNORMAL HIGH (ref 0.0–1.2)
Total Protein: 6.9 g/dL (ref 6.5–8.1)

## 2023-01-21 LAB — BASIC METABOLIC PANEL
Anion gap: 12 (ref 5–15)
BUN: 5 mg/dL — ABNORMAL LOW (ref 6–20)
CO2: 27 mmol/L (ref 22–32)
Calcium: 8.9 mg/dL (ref 8.9–10.3)
Chloride: 96 mmol/L — ABNORMAL LOW (ref 98–111)
Creatinine, Ser: 1.19 mg/dL (ref 0.61–1.24)
GFR, Estimated: >60 mL/min (ref >60)
Glucose, Bld: 120 mg/dL — ABNORMAL HIGH (ref 70–99)
Potassium: 3.7 mmol/L (ref 3.5–5.1)
Sodium: 135 mmol/L (ref 135–145)

## 2023-01-21 LAB — GAMMA GT: GGT: 1934 U/L — ABNORMAL HIGH (ref 7–50)

## 2023-01-21 LAB — GLUCOSE, CAPILLARY
Glucose-Capillary: 113 mg/dL — ABNORMAL HIGH (ref 70–99)
Glucose-Capillary: 235 mg/dL — ABNORMAL HIGH (ref 70–99)
Glucose-Capillary: 257 mg/dL — ABNORMAL HIGH (ref 70–99)
Glucose-Capillary: 199 mg/dL — ABNORMAL HIGH (ref 70–99)

## 2023-01-21 NOTE — Progress Notes (Addendum)
 Summary: Danny West is a 48 yo patient with a history of asthma, MDD, hypothyroidism, prediabetes, and bulging disc who presented to the emergency department for evaluation following abnormal lab values and was admitted to the Internal Medicine Teaching Service on 1/10 for alcoholic hepatitis.   Subjective:  Day #1. Patient feels tired today. His only pain is at his IV in the R arm. No N/V/D, chills, tremors, headaches, CP, dyspnea. He feels hungry. His urine yesterday was dark, clear, with yellow foam. Reports that he lives in isolation. There are some family members in the area. He had about 60 days of sobriety from alcohol earlier this year but relapsed upon discovering his father dead on the floor (around 2022/04/26). In October he went to a 30 day rehab but was not in the right mindset. Reports alcohol dependence x 20 years. No other acute complaints.   Objective:  Vital signs in last 24 hours: Vitals:   01/20/23 2145 01/21/23 0010 01/21/23 0805 01/21/23 1608  BP: (!) 142/88 122/81 121/82 123/80  Pulse: 79 86 81 93  Resp: 15 18 18 17   Temp:  97.9 F (36.6 C) 98 F (36.7 C) (!) 97.5 F (36.4 C)  TempSrc:  Oral Oral Oral  SpO2: 100% 100% 97% 97%      Latest Ref Rng & Units 01/21/2023    7:50 AM 01/20/2023    6:24 PM 01/20/2023    2:00 PM  CBC  WBC 4.0 - 10.5 K/uL 4.0  6.0  5.8   Hemoglobin 13.0 - 17.0 g/dL 89.2  87.6  87.4   Hematocrit 39.0 - 52.0 % 29.8  35.0  35.3   Platelets 150 - 400 K/uL 51  71  72        Latest Ref Rng & Units 01/21/2023    7:50 AM 01/20/2023    6:24 PM 01/20/2023    2:00 PM  BMP  Glucose 70 - 99 mg/dL 879  886  881   BUN 6 - 20 mg/dL 5  8  10    Creatinine 0.61 - 1.24 mg/dL 8.80  9.02  9.01   Sodium 135 - 145 mmol/L 135  132  132   Potassium 3.5 - 5.1 mmol/L 3.7  3.7  3.4   Chloride 98 - 111 mmol/L 96  94  93   CO2 22 - 32 mmol/L 27  25  29    Calcium 8.9 - 10.3 mg/dL 8.9  8.6  8.7     Physical Exam Constitutional: Patient is resting comfortably in  bed in no acute distress.  CV: Regular rate and rhythm without murmurs on auscultation. No LE edema.  Pulmonary/Respiratory: Normal respiratory effort on room air, lungs clear bilaterally.  Abdominal: Normal bowel sounds on auscultation, abdomen soft, non-tender, non-distended, no ascites present.  Neuro: No sign of tremors, alert and oriented to self, place, situation, and date, no focal deficits observed.  Skin: Jaundiced appearance, warm extremities, palmar erythema, or spider angiomas observed.  Psych: Normal mood and affect.   Assessment/Plan:  Principal Problem:   Alcoholic hepatitis without ascites Active Problems:   MDD (major depressive disorder), recurrent severe, without psychosis (HCC)   Acquired hypothyroidism   Alcohol use disorder, severe, dependence (HCC)   Normocytic anemia   Prediabetes   HLD (hyperlipidemia)   Obesity   Hypertriglyceridemia   Thrombocytopenia (HCC)  Patient is a 48 yo patient with a history of asthma, MDD, hypothyroidism, prediabetes, and bulging disc who was admitted to the Internal Medicine Teaching  Service on 1/10 for alcoholic hepatitis.   Alcoholic hepatitis without ascites Alcohol use disorder Elevated LFTs Thrombocytopenia Patient with history of alcohol use disorder. Maddrey's score of 36.5 calculated by admitting team using PT 17.5, INR 1.4 to help determine appropriate to start prednisolone  40 mg daily x 7 days, followed by [facility] score calculation to determine utility of steroid therapy moving forward. Patient's morning assessment without concern for encephalopathy to suggest acute liver failure. GI consulted inpatient to help determine any additional GI workup in the inpatient setting. Appreciate the GI team seeing patient today, recommending outpatient follow up on 1/17 to calculate Lille score and assess response to steroids at that time.   Thrombocytopenia likely in the setting of hepatopathy and alcohol use. LDH elevated, haptoglobin  pending to help rule out hemolytic process although less likely and haptoglobin likely to be low in liver disease. Plan: - Daily prednisolone  40 mg (day 1/7) - Trend LFTs  - Folate acid, B12, Thiamine  - Patient education on alcohol cessation  - CIWA protocol with Ativan    Hypothyroidism Patient with inconsistent medication use, including synthroid /levothyroxine  200 mcg daily. TSH here elevated, free T4 reduced. Plan: - Resumed levothyroxine  200 mcg daily before breakfast   Prediabetes Hgb A1c 6.3, prediabetes range. Patient would benefit from outpatient follow-up and lifestyle modification. Discussed importance of having a PCP, patient agreeable to establishing with a PCP, will put in for [facility] assistance.  Plan: - Monitor CBGs as patient is receiving steroid therapy   Mixed hyperlipidemia Mixed hyperlipidemia on lipid panel. Due to acute hepatitis and elevated triglycerides, unable to calculate LDL. Will hold off on starting statin, recommend outpatient follow up.    MDD Denied suicidal or homicidal ideation on admission. Patient's children are a major source of motivation for him to live. Follows with behavioral health.  Plan: - Continue home Zoloft /sertraline  100 mg daily   Insomnia Chronic. On home Seroquel . - Continue home Seroquel /quetiapine  50 mg at bedtime   Asthma Chronic. Uses/ has used albuterol  inhaler at home.  - Albuterol  neb solution 2.5 mL q6H PRN   Diet: Normal VTE: SCDs IVF: None,None Code: Full   Prior to Admission Living Arrangement: Home Anticipated Discharge Location: TBD Barriers to Discharge: Pending medical management. Dispo: Anticipated discharge in approximately 1-2 day(s).   Arellano Zameza, Namish Krise, MD, PGY-1 01/21/2023, 5:48 PM Pager: [phone] After 5pm on weekdays and 1pm on weekends: On Call pager [phone]

## 2023-01-21 NOTE — Plan of Care (Signed)

## 2023-01-21 NOTE — Progress Notes (Signed)
 Pt arrived from ER...the patient ambulated to bed without difficulty. admission assessment completed pt Aox4, follows simple commands. Pt had something to eat and slept until morning medication. Pt denies any pain at this time

## 2023-01-21 NOTE — Consult Note (Addendum)
 Consultation  Referring Provider:  TR[facility]  Primary Care Physician:  Pcp, No Primary Gastroenterologist:  Sampson       Reason for Consultation:    alcoholic hepatitis   LOS: 1 day          HPI:   Danny West is a 48 y.o. male with past medical history significant for asthma, MDD, hypothyroidism, alcohol abuse, presents for evaluation of alcoholic hepatitis.   Patient presents to ED after seeing therapist who noticed patient was jaundiced.  He was then seen at behavioral urgent care for detox he was found to have elevated LFTs and sent to the ED for further evaluation.  Patient has a longstanding history of alcohol abuse multiple visits to the ED for same.  Patient has had increase in malaise/fatigue.  Labs notable for AST 145/ALT 81/alk phos 240 Total bilirubin 8.2 MDF 33.2 Albumin 2.8 Hgb 10.7, MCV 81.2 Platelets 51 PT 17.5/INR 1.4 Ammonia 38 Negative hepatitis panel RUQ ultrasound with normal liver.  No gallstones or gallbladder wall thickening.  Gallbladder sludge noted  Patient was evaluated by internal medicine and started on prednisolone  40 Mg in addition to CIWA protocol  Patient states he noted he had coke colored urine yesterday and this was new for him.  No previous episodes of alcoholic hepatitis though he has had chronically elevated LFTs.  He notes he drinks multiple beers daily including mikes hard lemonade.  No liquor.  He has done this for many years and is interested in quitting.  Denies abdominal pain, nausea, vomiting.  Past Medical History:  Diagnosis Date   Alcohol use disorder, severe, dependence (HCC) 09/13/2021   Asthma    H/O suicide attempt 09/14/2021   June 2023 - took handful of zoloft  and naltrexone  while home alone, wrote on whiteboard at home, if you're alive tomorrow, you will live for your kids. Has not mentioned this to anyone (counselor included), no psych hospitalization    Surgical History:  He  has a past surgical history that  includes No past surgeries. Family History:  His family history is not on file. Social History:   reports that he has never smoked. His smokeless tobacco use includes snuff. He reports current alcohol use. He reports that he does not use drugs.  Prior to Admission medications   Medication Sig Start Date End Date Taking? Authorizing Provider  albuterol  (VENTOLIN  HFA) 108 (90 Base) MCG/ACT inhaler Inhale 1 puff into the lungs every 6 (six) hours as needed for wheezing or shortness of breath.   Yes [provider]  levothyroxine  (SYNTHROID ) 200 MCG tablet Take 1 tablet (200 mcg total) by mouth daily before breakfast. 10/19/22  Yes McCarty, Artie, MD  Multiple Vitamin (MULTIVITAMIN WITH MINERALS) TABS tablet Take 1 tablet by mouth daily.   Yes [provider]  Omega-3 Fatty Acids (FISH OIL) 1000 MG CAPS Take 1 capsule by mouth daily.   Yes [provider]  QUEtiapine  (SEROQUEL ) 50 MG tablet Take 1 tablet (50 mg total) by mouth at bedtime. 10/19/22  Yes Cornelius Dines, MD  sertraline  (ZOLOFT ) 100 MG tablet Take 1 tablet (100 mg total) by mouth daily. 10/19/22  Yes Cornelius Dines, MD    Current Facility-Administered Medications  Medication Dose Route Frequency Provider Last Rate Last Admin   albuterol  (PROVENTIL ) (2.5 MG/3ML) 0.083% nebulizer solution 2.5 mL  2.5 mL Inhalation Q6H PRN Tobie Gaines, DO       cyanocobalamin  (VITAMIN B12) tablet 1,000 mcg  1,000 mcg  Oral Daily Tobie Gaines, DO   1,000 mcg at 01/21/23 9176   folic acid  (FOLVITE ) tablet 1 mg  1 mg Oral Daily Tobie Gaines, DO   1 mg at 01/21/23 9175   levothyroxine  (SYNTHROID ) tablet 200 mcg  200 mcg Oral QAC breakfast Tobie Gaines, DO   200 mcg at 01/21/23 9478   LORazepam  (ATIVAN ) injection 0-4 mg  0-4 mg Intravenous Q6H Ali, Amjad, PA-C       Or   LORazepam  (ATIVAN ) tablet 0-4 mg  0-4 mg Oral Q6H Ali, Amjad, PA-C       [START ON 01/23/2023] LORazepam  (ATIVAN ) injection 0-4 mg  0-4 mg Intravenous Q12H Ali, Amjad,  PA-C       Or   [START ON 01/23/2023] LORazepam  (ATIVAN ) tablet 0-4 mg  0-4 mg Oral Q12H Ali, Amjad, PA-C       polyethylene glycol (MIRALAX  / GLYCOLAX ) packet 17 g  17 g Oral Daily PRN Tobie Gaines, DO       prednisoLONE  tablet 40 mg  40 mg Oral Daily Tobie Gaines, DO   40 mg at 01/21/23 9175   QUEtiapine  (SEROQUEL ) tablet 50 mg  50 mg Oral QHS Patel, Amar, DO   50 mg at 01/20/23 2239   sertraline  (ZOLOFT ) tablet 100 mg  100 mg Oral Daily Tobie Gaines, DO   100 mg at 01/21/23 9175   thiamine  (VITAMIN B1) tablet 100 mg  100 mg Oral Daily Ali, Amjad, PA-C   100 mg at 01/21/23 9175   Or   thiamine  (VITAMIN B1) injection 100 mg  100 mg Intravenous Daily Ali, Amjad, PA-C   100 mg at 01/20/23 2033    Allergies as of 01/20/2023   (No Known Allergies)    Review of Systems  Constitutional:  Positive for malaise/fatigue. Negative for chills, fever and weight loss.  HENT:  Negative for hearing loss and tinnitus.   Eyes:  Negative for blurred vision and double vision.  Respiratory:  Negative for cough and hemoptysis.   Cardiovascular:  Negative for chest pain and palpitations.  Gastrointestinal:  Negative for abdominal pain, blood in stool, constipation, diarrhea, heartburn, melena, nausea and vomiting.  Genitourinary:  Negative for dysuria and urgency.  Musculoskeletal:  Negative for myalgias and neck pain.  Skin:  Negative for itching and rash.  Neurological:  Negative for seizures and loss of consciousness.  Psychiatric/Behavioral:  Negative for depression and suicidal ideas.        Physical Exam:  Vital signs in last 24 hours: Temp:  [97.9 F (36.6 C)-98.3 F (36.8 C)] 98 F (36.7 C) (01/10 0805) Pulse Rate:  [76-87] 81 (01/10 0805) Resp:  [14-18] 18 (01/10 0805) BP: (121-148)/(76-93) 121/82 (01/10 0805) SpO2:  [97 %-100 %] 97 % (01/10 0805) Last BM Date : 01/20/23 Last BM recorded by nurses in past 5 days No data recorded  Physical Exam Constitutional:      Appearance: He is  ill-appearing.  HENT:     Head: Normocephalic and atraumatic.     Nose: Nose normal. No congestion.     Mouth/Throat:     Mouth: Mucous membranes are moist.     Pharynx: Oropharynx is clear.  Eyes:     General: Scleral icterus present.     Extraocular Movements: Extraocular movements intact.  Cardiovascular:     Rate and Rhythm: Normal rate and regular rhythm.  Pulmonary:     Effort: Pulmonary effort is normal. No respiratory distress.  Abdominal:     General: Bowel sounds  are normal. There is no distension.     Palpations: Abdomen is soft. There is no mass.     Tenderness: There is no abdominal tenderness. There is no guarding or rebound.     Hernia: No hernia is present.  Musculoskeletal:        General: No swelling. Normal range of motion.     Cervical back: Normal range of motion and neck supple.  Skin:    General: Skin is warm and dry.     Coloration: Skin is jaundiced.  Neurological:     General: No focal deficit present.     Mental Status: He is oriented to person, place, and time.  Psychiatric:        Mood and Affect: Mood normal.        Behavior: Behavior normal.        Thought Content: Thought content normal.        Judgment: Judgment normal.      LAB RESULTS: Recent Labs    01/20/23 1400 01/20/23 1824 01/21/23 0750  WBC 5.8 6.0 4.0  HGB 12.5* 12.3* 10.7*  HCT 35.3* 35.0* 29.8*  PLT 72* 71* 51*   BMET Recent Labs    01/20/23 1400 01/20/23 1824 01/21/23 0750  NA 132* 132* 135  K 3.4* 3.7 3.7  CL 93* 94* 96*  CO2 29 25 27   GLUCOSE 118* 113* 120*  BUN 10 8 5*  CREATININE 0.98 0.97 1.19  CALCIUM 8.7* 8.6* 8.9   LFT Recent Labs    01/21/23 0750  PROT 6.9  ALBUMIN 2.8*  AST 145*  ALT 81*  ALKPHOS 240*  BILITOT 8.2*  BILIDIR 4.9*  IBILI 3.3*   PT/INR Recent Labs    01/20/23 2205  LABPROT 17.5*  INR 1.4*    STUDIES: US  Abdomen Limited RUQ (LIVER/GB) Result Date: 01/20/2023 CLINICAL DATA:  Abdominal pain EXAM: ULTRASOUND ABDOMEN  LIMITED RIGHT UPPER QUADRANT COMPARISON:  None Available. FINDINGS: Gallbladder: Physiologically distended with some layering sludge. No gallstones or wall thickening visualized. No sonographic Murphy sign noted by sonographer. Common bile duct: Diameter: 3.2 mm. No intrahepatic biliary ductal dilatation identified. Liver: No focal lesion identified. Within normal limits in parenchymal echogenicity. Portal vein is patent on color Doppler imaging with normal direction of blood flow towards the liver. Other: None. IMPRESSION: Gallbladder sludge. No acute findings. Electronically Signed   By: JONETTA Faes M.D.   On: 01/20/2023 19:51      Impression    Alcoholic hepatitis AST 145/ALT 81/alk phos 240 Total bilirubin 8.2 MDF 33.2 Albumin 2.8 Hgb 10.7, MCV 81.2 Platelets 51 PT 17.5/INR 1.4 Ammonia 38 Negative hepatitis panel RUQ ultrasound with normal liver.  No gallstones or gallbladder wall thickening.  Gallbladder sludge noted -- MDF of 33.2 indicating steroid intervention. Continue prednisolone  40mg  daily. Check Lille score at day 7. -- continue to trend LFTS -Likely no further inpatient GI workup at this time.  Would recommend outpatient follow-up. - Outpatient lab visit 1/7 to calculate Lille score to assess response to steroids. If responding, would continue full 28 day course of prednisolone  -- outpatient OV follow up after lab draw (see discharge)  Alcohol use disorder -- CIWA protocol -- MVA, thiamine , folic acid  -- educated on importance of complete alcohol cessation.  Thank you for your kind consultation, we will continue to follow.   Bayley CHRISTELLA Blower  01/21/2023, 11:04 AM    Attending physician's note  I have taken a history, reviewed the chart and examined the patient. I  performed a substantive portion of this encounter, including complete performance of at least one of the key components, in conjunction with the APP. I agree with the APP's note, impression and  recommendations.    37 yr M with acute alcoholic hepatitis MDF >32, on prednisolone  40mg  daily, calculate lille score on Day 7 to evaluate if patient needs to continue treatment to complete 28 day course Discussed alcohol abstinence  F/u in GI office as scheduled  Inpatient GI signing off, available for any questions  The patient was provided an opportunity to ask questions and all were answered. The patient agreed with the plan and demonstrated an understanding of the instructions.  LOIS Wilkie Mcgee , MD [phone]

## 2023-01-21 NOTE — Progress Notes (Signed)
 Mobility Specialist Progress Note:    01/21/23 1103  Mobility  Activity Ambulated independently to bathroom  Level of Assistance Standby assist, set-up cues, supervision of patient - no hands on  Assistive Device None  Distance Ambulated (ft) 24 ft  Activity Response Tolerated well  Mobility Referral Yes  Mobility visit 1 Mobility  Mobility Specialist Start Time (ACUTE ONLY) 1055  Mobility Specialist Stop Time (ACUTE ONLY) 1102  Mobility Specialist Time Calculation (min) (ACUTE ONLY) 7 min   Pt received in bed, requesting assistance to bathroom. Tolerated well, asx throughout. SBA for safety, no AD required. Pt returned to bed, all needs met.    Ravis Herne Mobility Specialist Please contact via Special Educational Needs Teacher or  Rehab office at [phone]

## 2023-01-22 LAB — CBC
HCT: 30.8 % — ABNORMAL LOW (ref 39.0–52.0)
Hemoglobin: 10.8 g/dL — ABNORMAL LOW (ref 13.0–17.0)
MCH: 28.9 pg (ref 26.0–34.0)
MCHC: 35.1 g/dL (ref 30.0–36.0)
MCV: 82.4 fL (ref 80.0–100.0)
Platelets: 58 10*3/uL — ABNORMAL LOW (ref 150–400)
RBC: 3.74 MIL/uL — ABNORMAL LOW (ref 4.22–5.81)
RDW: 21.2 % — ABNORMAL HIGH (ref 11.5–15.5)
WBC: 5.1 10*3/uL (ref 4.0–10.5)
nRBC: 0 % (ref 0.0–0.2)

## 2023-01-22 LAB — COMPREHENSIVE METABOLIC PANEL
ALT: 71 U/L — ABNORMAL HIGH (ref 0–44)
AST: 125 U/L — ABNORMAL HIGH (ref 15–41)
Albumin: 2.8 g/dL — ABNORMAL LOW (ref 3.5–5.0)
Alkaline Phosphatase: 258 U/L — ABNORMAL HIGH (ref 38–126)
Anion gap: 9 (ref 5–15)
BUN: 9 mg/dL (ref 6–20)
CO2: 25 mmol/L (ref 22–32)
Calcium: 8.7 mg/dL — ABNORMAL LOW (ref 8.9–10.3)
Chloride: 99 mmol/L (ref 98–111)
Creatinine, Ser: 1.03 mg/dL (ref 0.61–1.24)
GFR, Estimated: >60 mL/min (ref >60)
Glucose, Bld: 119 mg/dL — ABNORMAL HIGH (ref 70–99)
Potassium: 3.9 mmol/L (ref 3.5–5.1)
Sodium: 133 mmol/L — ABNORMAL LOW (ref 135–145)
Total Bilirubin: 8.2 mg/dL — ABNORMAL HIGH (ref 0.0–1.2)
Total Protein: 7 g/dL (ref 6.5–8.1)

## 2023-01-22 LAB — GLUCOSE, CAPILLARY
Glucose-Capillary: 193 mg/dL — ABNORMAL HIGH (ref 70–99)
Glucose-Capillary: 134 mg/dL — ABNORMAL HIGH (ref 70–99)
Glucose-Capillary: 185 mg/dL — ABNORMAL HIGH (ref 70–99)
Glucose-Capillary: 228 mg/dL — ABNORMAL HIGH (ref 70–99)

## 2023-01-22 NOTE — Plan of Care (Signed)

## 2023-01-22 NOTE — Progress Notes (Addendum)
 Summary: Danny West is a 48 yo patient with a history of asthma, MDD, hypothyroidism, prediabetes, and bulging disc who presented to the emergency department for evaluation following abnormal lab values and was admitted to the Internal Medicine Teaching Service on 1/10 for alcoholic hepatitis.   Subjective:  Pt seen bedside this AM. States that he is doing much better symptom wise, and does not feel as tired.   Objective:  Vital signs in last 24 hours: Vitals:   01/22/23 0039 01/22/23 0633 01/22/23 0805 01/22/23 0850  BP: 110/65 106/73 (!) 94/46 103/66  Pulse: 85 79 (!) 105 84  Resp: 17 17 18 18   Temp: 98.3 F (36.8 C) 97.8 F (36.6 C) 98 F (36.7 C) 97.9 F (36.6 C)  TempSrc: Oral Oral Oral Oral  SpO2: 96% 99% 100% 97%      Latest Ref Rng & Units 01/22/2023    6:42 AM 01/21/2023    7:50 AM 01/20/2023    6:24 PM  CBC  WBC 4.0 - 10.5 K/uL 5.1  4.0  6.0   Hemoglobin 13.0 - 17.0 g/dL 89.1  89.2  87.6   Hematocrit 39.0 - 52.0 % 30.8  29.8  35.0   Platelets 150 - 400 K/uL 58  51  71        Latest Ref Rng & Units 01/22/2023    6:42 AM 01/21/2023    7:50 AM 01/20/2023    6:24 PM  BMP  Glucose 70 - 99 mg/dL 880  879  886   BUN 6 - 20 mg/dL 9  5  8    Creatinine 0.61 - 1.24 mg/dL 8.96  8.80  9.02   Sodium 135 - 145 mmol/L 133  135  132   Potassium 3.5 - 5.1 mmol/L 3.9  3.7  3.7   Chloride 98 - 111 mmol/L 99  96  94   CO2 22 - 32 mmol/L 25  27  25    Calcium 8.9 - 10.3 mg/dL 8.7  8.9  8.6     Physical Exam Constitutional: Patient is resting comfortably in bed in no acute distress.  CV: Regular rate and rhythm without murmurs on auscultation. No LE edema.  Pulmonary/Respiratory: Normal respiratory effort on room air, lungs clear bilaterally.  Abdominal: Normal bowel sounds on auscultation, abdomen soft, non-tender, non-distended, no ascites present.  Neuro: No sign of tremors, alert and oriented to self, place, situation, and date, no focal deficits observed.  Skin: Jaundiced  appearance, warm extremities, palmar erythema, or spider angiomas observed.  Psych: Normal mood and affect.   Assessment/Plan:  Principal Problem:   Alcoholic hepatitis without ascites Active Problems:   MDD (major depressive disorder), recurrent severe, without psychosis (HCC)   Acquired hypothyroidism   Alcohol use disorder, severe, dependence (HCC)   Normocytic anemia   Prediabetes   HLD (hyperlipidemia)   Obesity   Hypertriglyceridemia   Thrombocytopenia (HCC)  Patient is a 48 yo patient with a history of asthma, MDD, hypothyroidism, prediabetes, and bulging disc who was admitted to the Internal Medicine Teaching Service on 1/10 for alcoholic hepatitis.   Alcoholic hepatitis without ascites Alcohol use disorder Elevated LFTs Thrombocytopenia Patient with history of alcohol use disorder. Maddrey's score of 36.5 calculated by admitting team using PT 17.5, INR 1.4 to help determine appropriate to start prednisolone  40 mg daily x 7 days, followed by [facility] score calculation to determine utility of steroid therapy moving forward. GI consulted to determine any additional GI workup in the inpatient setting. Recommending outpatient  lab follow up on 1/17 to calculate Lille score and assess response to steroids at that time. GI clinic appt 1/24.  Thrombocytopenia likely in the setting of hepatopathy and alcohol use, stable. Liver enzymes thankfully trending in the right direction.  Plan: - Daily prednisolone  40 mg (day 2/7) - Trend LFTs  - Folate acid, B12, Thiamine   Risk for Alcohol Withdrawal Last drink 1/9, now about 48H since last alcohol use. He does deny any symptoms of withdrawal currently, and CIWA scores are currently 0. Will continue CIWA with ativan  prn. He does say that he is adamant to quit drinking, and has tried naltrexone  previously with no success. We did mention acamprosate however he feels like this may cause excessive pill burden.   Plan:  - CIWA with ativan  prn    Hypothyroidism Patient with inconsistent medication use, including synthroid /levothyroxine  200 mcg daily. TSH here elevated, free T4 reduced. Plan: - Resumed levothyroxine  200 mcg daily before breakfast   Prediabetes Hgb A1c 6.3, prediabetes range. Patient would benefit from outpatient follow-up and lifestyle modification. Discussed importance of having a PCP, patient agreeable to establishing with a PCP, will put in for [facility] assistance.  Plan: - Monitor CBGs as patient is receiving steroid therapy   Mixed hyperlipidemia Mixed hyperlipidemia on lipid panel. Due to acute hepatitis and elevated triglycerides, unable to calculate LDL. Will hold off on starting statin, recommend outpatient follow up.    MDD Denied suicidal or homicidal ideation on admission. Patient's children are a major source of motivation for him to live. Follows with behavioral health.  Plan: - Continue home Zoloft /sertraline  100 mg daily   Insomnia Chronic. On home Seroquel . - Continue home Seroquel /quetiapine  50 mg at bedtime   Asthma Chronic. Uses/ has used albuterol  inhaler at home.  - Albuterol  neb solution 2.5 mL q6H PRN   Diet: Normal VTE: SCDs IVF: None,None Code: Full   Prior to Admission Living Arrangement: Home Anticipated Discharge Location: TBD Barriers to Discharge: Pending medical management. Dispo: Anticipated discharge in approximately 1-2 day(s).   Mara Favero, MD, PGY-2 01/22/2023, 10:44 AM Pager: [phone] After 5pm on weekdays and 1pm on weekends: On Call pager [phone]

## 2023-01-23 ENCOUNTER — Other Ambulatory Visit: Payer: Self-pay | Admitting: Student

## 2023-01-23 LAB — GLUCOSE, CAPILLARY
Glucose-Capillary: 180 mg/dL — ABNORMAL HIGH (ref 70–99)
Glucose-Capillary: 132 mg/dL — ABNORMAL HIGH (ref 70–99)

## 2023-01-23 LAB — HAPTOGLOBIN: Haptoglobin: <10 mg/dL — ABNORMAL LOW (ref 23–355)

## 2023-01-23 MED ORDER — PREDNISOLONE 5 MG PO TABS: 40.0000 mg | ORAL_TABLET | Freq: Every day | ORAL | 0 refills | Status: AC

## 2023-01-23 NOTE — Discharge Summary (Addendum)
 Name: Danny West MRN: [id number] DOB: 1975-01-18 48 y.o. PCP: Pcp, No  Date of Admission: 01/20/2023  5:58 PM Date of Discharge:  01/23/23 Attending Physician: Dr.  Karna  DISCHARGE DIAGNOSIS:  Primary Problem: Alcoholic hepatitis without ascites   Hospital Problems: Principal Problem:   Alcoholic hepatitis without ascites Active Problems:   MDD (major depressive disorder), recurrent severe, without psychosis (HCC)   Acquired hypothyroidism   Alcohol use disorder, severe, dependence (HCC)   Normocytic anemia   Prediabetes   HLD (hyperlipidemia)   Obesity   Hypertriglyceridemia   Thrombocytopenia (HCC)    DISCHARGE MEDICATIONS:   Allergies as of 01/23/2023   No Known Allergies      Medication List     TAKE these medications    albuterol  108 (90 Base) MCG/ACT inhaler Commonly known as: VENTOLIN  HFA Inhale 1 puff into the lungs every 6 (six) hours as needed for wheezing or shortness of breath.   Fish Oil 1000 MG Caps Take 1 capsule by mouth daily.   levothyroxine  200 MCG tablet Commonly known as: SYNTHROID  Take 1 tablet (200 mcg total) by mouth daily before breakfast.   multivitamin with minerals Tabs tablet Take 1 tablet by mouth daily.   prednisoLONE  5 MG Tabs tablet Take 8 tablets (40 mg total) by mouth daily for 4 doses. Start taking on: January 24, 2023   QUEtiapine  50 MG tablet Commonly known as: SEROquel  Take 1 tablet (50 mg total) by mouth at bedtime.   sertraline  100 MG tablet Commonly known as: Zoloft  Take 1 tablet (100 mg total) by mouth daily.        DISPOSITION AND FOLLOW-UP:  Danny West was discharged from [facility] in Fair condition. At the hospital follow up visit please address:  Follow-up Recommendations: Consults: GI Labs: Liver Panel, CBC  Studies: None  Medications: Prednisolone    Follow-up Appointments:  Follow-up Information     May, Deanna J, NP Follow up on 02/04/2023.   Specialty:  Gastroenterology Why: appt 830 am. please arrive 15 minutes prior to appt Contact information: [address] [place] [address] [phone]         [facility] Follow up on 01/28/2023.   Specialty: Gastroenterology Why: Please go to [facility] office at [address] [place], [place] [address] on 01/27/2022 between the hours of 7:30 am and 4:00 pm to have labs drawn. Our lab is located in the basement of the building. Contact information: [address] [place]   [address] [phone]                HOSPITAL COURSE:  Patient Summary: #Alcoholic hepatopathy #Alcohol use disorder #Elevated LFTs #Thrombocytopenia Presented with suspected alcoholic hepatitis.  Total bili elevated to 8.3 with AST greater than ALT.  No ascites or cirrhosis noted on right upper quadrant ultrasound.  Thrombocytopenia likely in setting of alcohol use.  Started on prednisolone  with elevated MDF score.  GI consulted for further workup.  They recommended outpatient follow-up on 1/17 to calculate Lille score and assess response to steroids.  Despite last drink on 1/9, he denied any signs or symptoms of alcohol withdrawal and his CIWA scores were 0 throughout hospitalization.  Prior to discharge, patient was counseled understanding of the importance of alcohol cessation to prevent progression of his symptoms.  Will follow-up outpatient with GI and message was sent to front desk to schedule follow-up with [facility]   #Hypothyroidism Noted to have had inconsistent medication use.  TSH  was elevated and free T4 was reduced.  Resumed levothyroxine  200 mcg daily. F/u in 6-8 weeks.  #Prediabetes A1c 6.3 in prediabetes range.  Will benefit from outpatient follow-up and lifestyle modifications.    #Mixed hyperlipidemia Likely secondary from acute hepatitis and elevated triglycerides. Held off on starting statin but recommend outpatient  follow-up.  #MDD Denied SI/HI throughout stay. Followed with behavioral health.  Continued on home Zoloft  100 mg daily.  #Insomnia Continued on home quetiapine  50 mg at bedtime   #Asthma Continued on at home PRN albuterol .     DISCHARGE INSTRUCTIONS:   Discharge Instructions     Call MD for:  difficulty breathing, headache or visual disturbances   Complete by: As directed    Call MD for:  extreme fatigue   Complete by: As directed    Call MD for:  hives   Complete by: As directed    Call MD for:  persistant dizziness or light-headedness   Complete by: As directed    Call MD for:  persistant nausea and vomiting   Complete by: As directed    Call MD for:  redness, tenderness, or signs of infection (pain, swelling, redness, odor or green/yellow discharge around incision site)   Complete by: As directed    Call MD for:  severe uncontrolled pain   Complete by: As directed    Call MD for:  temperature >100.4   Complete by: As directed    Diet - low sodium heart healthy   Complete by: As directed    Discharge instructions   Complete by: As directed    Danny West,  You were hospitalized for abnormal lab values and jaundice likely from your drinking. You were hospitalized for further workup of these lab values and to monitor for alcohol withdrawal.   For the past 24 hours, you have not shown any signs of withdrawal, and we feel that it is safe for you to go home. It is important that you stop drinking to prevent similar signs and symptoms from occurring again.   Please *CONTINUE* taking the following medication:  -Prednisolone  40 mg for four more days (final day 01/27/23)  Please continue to take your other medications as prescribed.   You are scheduled for a lab visit with GI on 01/28/23. You are also scheduled for an outpatient visit with GI on 02/04/23. As you do not have a PCP, we have also sent a message to our front desk to schedule a hospital follow-up appointment with the  clinic. You should be receiving a call from them in the coming days.  If you have any questions, please contact us  at [phone]. We are glad that you are feeling better!   Increase activity slowly   Complete by: As directed        SUBJECTIVE:  Patient was evaluated at bedside. Notes he has not had a BM since Friday. Some difficulties sleeping due to poor sleep schedule at baseline. Denied any abdominal pain, nausea, vomiting, chills, tremors, or AVH but did endorse HA. He also discussed how he feels like he has come to terms with that he cannot drink.   Discharge Vitals:   BP 115/72 (BP Location: Left Arm)   Pulse 84   Temp 98 F (36.7 C) (Oral)   Resp 18   SpO2 98%   OBJECTIVE:  Physical Exam Constitutional:      Appearance: Normal appearance.  HENT:     Head: Normocephalic and atraumatic.  Cardiovascular:  Rate and Rhythm: Normal rate and regular rhythm.     Pulses: Normal pulses.     Heart sounds: Normal heart sounds.  Pulmonary:     Effort: Pulmonary effort is normal.     Breath sounds: Normal breath sounds.  Abdominal:     General: Abdomen is flat. Bowel sounds are normal.     Palpations: Abdomen is soft.  Skin:    Comments: Jaundice improving  Neurological:     General: No focal deficit present.     Mental Status: He is alert.  Psychiatric:        Mood and Affect: Mood normal.        Behavior: Behavior normal.   Pertinent Labs, Studies, and Procedures:     Latest Ref Rng & Units 01/22/2023    6:42 AM 01/21/2023    7:50 AM 01/20/2023    6:24 PM  CBC  WBC 4.0 - 10.5 K/uL 5.1  4.0  6.0   Hemoglobin 13.0 - 17.0 g/dL 89.1  89.2  87.6   Hematocrit 39.0 - 52.0 % 30.8  29.8  35.0   Platelets 150 - 400 K/uL 58  51  71        Latest Ref Rng & Units 01/22/2023    6:42 AM 01/21/2023    7:50 AM 01/20/2023   10:05 PM  CMP  Glucose 70 - 99 mg/dL 880  879    BUN 6 - 20 mg/dL 9  5    Creatinine 9.38 - 1.24 mg/dL 8.96  8.80    Sodium 864 - 145 mmol/L 133  135     Potassium 3.5 - 5.1 mmol/L 3.9  3.7    Chloride 98 - 111 mmol/L 99  96    CO2 22 - 32 mmol/L 25  27    Calcium 8.9 - 10.3 mg/dL 8.7  8.9    Total Protein 6.5 - 8.1 g/dL 7.0  6.9    Total Bilirubin 0.0 - 1.2 mg/dL 8.2  8.2  8.4   Alkaline Phos 38 - 126 U/L 258  240    AST 15 - 41 U/L 125  145    ALT 0 - 44 U/L 71  81      US  Abdomen Limited RUQ (LIVER/GB) Result Date: 01/20/2023 CLINICAL DATA:  Abdominal pain EXAM: ULTRASOUND ABDOMEN LIMITED RIGHT UPPER QUADRANT COMPARISON:  None Available. FINDINGS: Gallbladder: Physiologically distended with some layering sludge. No gallstones or wall thickening visualized. No sonographic Murphy sign noted by sonographer. Common bile duct: Diameter: 3.2 mm. No intrahepatic biliary ductal dilatation identified. Liver: No focal lesion identified. Within normal limits in parenchymal echogenicity. Portal vein is patent on color Doppler imaging with normal direction of blood flow towards the liver. Other: None. IMPRESSION: Gallbladder sludge. No acute findings. Electronically Signed   By: JONETTA Faes M.D.   On: 01/20/2023 19:51     Signed: Signe Arabian, MD Internal Medicine Resident, PGY-1 [facility]  Pager: [phone]

## 2023-01-23 NOTE — Progress Notes (Deleted)
 Summary: Danny West is a 48 yo patient with a history of asthma, MDD, hypothyroidism, prediabetes, and bulging disc who presented to the emergency department for evaluation following abnormal lab values and was admitted to the Internal Medicine Teaching Service on 1/10 for alcoholic hepatitis.   Subjective:  Patient was evaluated at bedside. Notes he has not had a BM since Friday. Some difficulties sleeping due to poor sleep schedule at baseline. Denied any abdominal pain, nausea, vomiting, chills, tremors, or AVH but did endorse HA. He also discussed how he feels like he has come to terms with that he cannot drink.   Objective:  Vital signs in last 24 hours: Vitals:   01/22/23 0850 01/22/23 1627 01/22/23 1952 01/23/23 0411  BP: 103/66 115/74 120/74 128/82  Pulse: 84 93 75 83  Resp: 18  18 17   Temp: 97.9 F (36.6 C) 97.9 F (36.6 C) 97.7 F (36.5 C) 98.1 F (36.7 C)  TempSrc: Oral Oral Oral Oral  SpO2: 97% 95% 97% 96%      Latest Ref Rng & Units 01/22/2023    6:42 AM 01/21/2023    7:50 AM 01/20/2023    6:24 PM  CBC  WBC 4.0 - 10.5 K/uL 5.1  4.0  6.0   Hemoglobin 13.0 - 17.0 g/dL 89.1  89.2  87.6   Hematocrit 39.0 - 52.0 % 30.8  29.8  35.0   Platelets 150 - 400 K/uL 58  51  71        Latest Ref Rng & Units 01/22/2023    6:42 AM 01/21/2023    7:50 AM 01/20/2023    6:24 PM  BMP  Glucose 70 - 99 mg/dL 880  879  886   BUN 6 - 20 mg/dL 9  5  8    Creatinine 0.61 - 1.24 mg/dL 8.96  8.80  9.02   Sodium 135 - 145 mmol/L 133  135  132   Potassium 3.5 - 5.1 mmol/L 3.9  3.7  3.7   Chloride 98 - 111 mmol/L 99  96  94   CO2 22 - 32 mmol/L 25  27  25    Calcium 8.9 - 10.3 mg/dL 8.7  8.9  8.6     Physical Exam Constitutional:      Appearance: Normal appearance.  HENT:     Head: Normocephalic and atraumatic.  Cardiovascular:     Rate and Rhythm: Normal rate and regular rhythm.     Pulses: Normal pulses.     Heart sounds: Normal heart sounds.  Pulmonary:     Effort: Pulmonary  effort is normal.     Breath sounds: Normal breath sounds.  Abdominal:     General: Abdomen is flat. Bowel sounds are normal.     Palpations: Abdomen is soft.  Skin:    Comments: Jaundice improving  Neurological:     General: No focal deficit present.     Mental Status: He is alert.  Psychiatric:        Mood and Affect: Mood normal.        Behavior: Behavior normal.    Assessment/Plan:  Principal Problem:   Alcoholic hepatitis without ascites Active Problems:   MDD (major depressive disorder), recurrent severe, without psychosis (HCC)   Acquired hypothyroidism   Alcohol use disorder, severe, dependence (HCC)   Normocytic anemia   Prediabetes   HLD (hyperlipidemia)   Obesity   Hypertriglyceridemia   Thrombocytopenia (HCC)  Patient is a 49 yo patient with a history of asthma, MDD,  hypothyroidism, prediabetes, and bulging disc who was admitted to the Internal Medicine Teaching Service on 1/10 for alcoholic hepatitis.   #Alcoholic hepatitis without ascites #Alcohol use disorder #Elevated LFTs #Thrombocytopenia Stable. On CMP yesterday, LFTs improving. Platelets were also stable at 58. Scheduled for outpatient follow-up with GI on 02/04/23. Will continue on daily prednisolone . Will also continue folic acid , B12, thiamine  supplementation. - Continue daily prednisone 40 mg (day 3/7) - Continue folic acid , vitamin B12, and thiamine  supplementation  #Risk for Alcohol Withdrawal Last drink roughly 72 hours ago.  Continues to be high risk for alcohol withdrawal.  This morning, endorsed headache but denied any hallucinations, tremors, paroxysmal sweating, or any other signs or symptoms.  CIWA remains 0.  Anticipate discharge tomorrow if he is asymptomatic over the course of the day. - Continue CIWA with Ativan  as needed   #Hypothyroidism Continue levothyroxine  200 mcg daily before breakfast   #MDD Continues to deny SI/HI. Will continue home Zoloft /sertraline  100 mg daily    #Insomnia Chronic. Continue home Seroquel /quetiapine  50 mg at bedtime   #Asthma Chronic. Continue Albuterol  neb solution 2.5 mL q6H PRN   Diet: Normal VTE: SCDs IVF: None,None Code: Full   Prior to Admission Living Arrangement: Home Anticipated Discharge Location: TBD Barriers to Discharge: Pending medical management. Dispo: Anticipated discharge in approximately 1-2 day(s).   Stephanie Freund, MD, PGY-1 01/23/2023, 6:55 AM After 5pm on weekdays and 1pm on weekends: On Call pager [phone]

## 2023-02-02 NOTE — Progress Notes (Deleted)
Chief Complaint: follow-up hospitalization, alcoholic hepatitis Primary GI Doctor: Dr. Lavon Paganini  HPI: Danny West is a 48 y.o. male with past medical history significant for asthma, MDD, hypothyroidism, alcohol abuse, presents for follow-up on recent hospitalization for alcoholic hepatitis.   On 01/21/2023 patient presented to ED after therapist noticed patient was jaundiced.  He was then seen at behavioral urgent care for detox and he was found to have elevated LFTs and sent to the ED for further evaluation.  Patient has a longstanding history of alcohol abuse multiple visits to the ED for same. Labs notable for AST 145/ALT 81/alk phos 240 Total bilirubin 8.2 MDF 33.2 Albumin 2.8 Hgb 10.7, MCV 81.2 Platelets 51 PT 17.5/INR 1.4 Ammonia 38 Negative hepatitis panel RUQ ultrasound with normal liver.  No gallstones or gallbladder wall thickening.  Gallbladder sludge noted.  Patient was evaluated by internal medicine and started on prednisolone 40 Mg in addition to CIWA protocol.     Interval History  Patient admits/denies GERD Patient admits/denies dysphagia Patient admits/denies nausea, vomiting, or weight loss  Patient admits/denies altered bowel habits Patient admits/denies abdominal pain Patient admits/denies rectal bleeding   Per ED visit *** Patient states he noted he had coke colored urine yesterday and this was new for him. No previous episodes of alcoholic hepatitis though he has had chronically elevated LFTs. He notes he drinks multiple beers daily including mikes hard lemonade. No liquor. He has done this for many years and is interested in quitting.    Denies/Admits alcohol Denies/Admits smoking Denies/Admits NSAID use. Denies/Admits they are on blood thinners.  Patients last colonoscopy*** Patients last EGD  Patient's family history includes  Wt Readings from Last 3 Encounters:  01/22/22 183 lb (83 kg)  12/09/21 178 lb 2.1 oz (80.8 kg)  09/21/21 178 lb  3.2 oz (80.8 kg)      Past Medical History:  Diagnosis Date   Alcohol use disorder, severe, dependence (HCC) 09/13/2021   Asthma    H/O suicide attempt 09/14/2021   June 2023 - "took handful of zoloft and naltrexone while home alone", wrote on whiteboard at home, "if you're alive tomorrow, you will live for your kids". Has not mentioned this to anyone Academic librarian included), no psych hospitalization    Past Surgical History:  Procedure Laterality Date   NO PAST SURGERIES      Current Outpatient Medications  Medication Sig Dispense Refill   albuterol (VENTOLIN HFA) 108 (90 Base) MCG/ACT inhaler Inhale 1 puff into the lungs every 6 (six) hours as needed for wheezing or shortness of breath.     levothyroxine (SYNTHROID) 200 MCG tablet Take 1 tablet (200 mcg total) by mouth daily before breakfast. 30 tablet 0   Multiple Vitamin (MULTIVITAMIN WITH MINERALS) TABS tablet Take 1 tablet by mouth daily.     Omega-3 Fatty Acids (FISH OIL) 1000 MG CAPS Take 1 capsule by mouth daily.     QUEtiapine (SEROQUEL) 50 MG tablet Take 1 tablet (50 mg total) by mouth at bedtime. 30 tablet 0   sertraline (ZOLOFT) 100 MG tablet Take 1 tablet (100 mg total) by mouth daily. 30 tablet 0   No current facility-administered medications for this visit.    Allergies as of 02/04/2023   (No Known Allergies)    No family history on file.  Review of Systems:    Constitutional: No weight loss, fever, chills, weakness or fatigue HEENT: Eyes: No change in vision  Ears, Nose, Throat:  No change in hearing or congestion Skin: No rash or itching Cardiovascular: No chest pain, chest pressure or palpitations   Respiratory: No SOB or cough Gastrointestinal: See HPI and otherwise negative Genitourinary: No dysuria or change in urinary frequency Neurological: No headache, dizziness or syncope Musculoskeletal: No new muscle or joint pain Hematologic: No bleeding or bruising Psychiatric: No history of  depression or anxiety    Physical Exam:  Vital signs: There were no vitals taken for this visit.  Constitutional:   Pleasant Caucasian male*** appears to be in NAD, Well developed, Well nourished, alert and cooperative Head:  Normocephalic and atraumatic. Eyes:   PEERL, EOMI. No icterus. Conjunctiva pink. Ears:  Normal auditory acuity. Neck:  Supple Throat: Oral cavity and pharynx without inflammation, swelling or lesion.  Respiratory: Respirations even and unlabored. Lungs clear to auscultation bilaterally.   No wheezes, crackles, or rhonchi.  Cardiovascular: Normal S1, S2. Regular rate and rhythm. No peripheral edema, cyanosis or pallor.  Gastrointestinal:  Soft, nondistended, nontender. No rebound or guarding. Normal bowel sounds. No appreciable masses or hepatomegaly. Rectal:  Not performed.  Anoscopy: Msk:  Symmetrical without gross deformities. Without edema, no deformity or joint abnormality.  Neurologic:  Alert and  oriented x4;  grossly normal neurologically.  Skin:   Dry and intact without significant lesions or rashes. Psychiatric: Oriented to person, place and time. Demonstrates good judgement and reason without abnormal affect or behaviors.  RELEVANT LABS AND IMAGING: CBC    Latest Ref Rng & Units 01/22/2023    6:42 AM 01/21/2023    7:50 AM 01/20/2023    6:24 PM  CBC  WBC 4.0 - 10.5 K/uL 5.1  4.0  6.0   Hemoglobin 13.0 - 17.0 g/dL 16.1  09.6  04.5   Hematocrit 39.0 - 52.0 % 30.8  29.8  35.0   Platelets 150 - 400 K/uL 58  51  71      CMP     Latest Ref Rng & Units 01/22/2023    6:42 AM 01/21/2023    7:50 AM 01/20/2023   10:05 PM  CMP  Glucose 70 - 99 mg/dL 409  811    BUN 6 - 20 mg/dL 9  5    Creatinine 9.14 - 1.24 mg/dL 7.82  9.56    Sodium 213 - 145 mmol/L 133  135    Potassium 3.5 - 5.1 mmol/L 3.9  3.7    Chloride 98 - 111 mmol/L 99  96    CO2 22 - 32 mmol/L 25  27    Calcium 8.9 - 10.3 mg/dL 8.7  8.9    Total Protein 6.5 - 8.1 g/dL 7.0  6.9    Total  Bilirubin 0.0 - 1.2 mg/dL 8.2  8.2  8.4   Alkaline Phos 38 - 126 U/L 258  240    AST 15 - 41 U/L 125  145    ALT 0 - 44 U/L 71  81       Lab Results  Component Value Date   TSH 183.194 (H) 01/20/2023    Assessment: 1. ***  Plan: -Order lab work  -Outpatient lab visit 1/17 to calculate Lille score to assess response to steroids. If responding, would continue full 28 day course of prednisolone  -educated on importance of complete alcohol cessation.    Thank you for the courtesy of this consult. Please call me with any questions or concerns.   Megon Kalina, FNP-C [facility] 02/02/2023, 1:56 PM  Cc: No ref. provider found \

## 2023-02-04 ENCOUNTER — Ambulatory Visit: Payer: Self-pay | Admitting: Gastroenterology

## 2023-02-10 ENCOUNTER — Encounter: Payer: Self-pay | Admitting: Student

## 2023-06-20 ENCOUNTER — Ambulatory Visit (HOSPITAL_COMMUNITY)
Admission: EM | Admit: 2023-06-20 | Discharge: 2023-06-21 | Disposition: A | Discharge status: Skilled Nursing Facility | Attending: Nurse Practitioner | Admitting: Nurse Practitioner

## 2023-06-20 DIAGNOSIS — F1994 Other psychoactive substance use, unspecified with psychoactive substance-induced mood disorder: Secondary | ICD-10-CM | POA: Diagnosis not present

## 2023-06-20 DIAGNOSIS — F332 Major depressive disorder, recurrent severe without psychotic features: Secondary | ICD-10-CM | POA: Diagnosis not present

## 2023-06-20 DIAGNOSIS — Z91141 Patient's other noncompliance with medication regimen due to financial hardship: Secondary | ICD-10-CM | POA: Insufficient documentation

## 2023-06-20 DIAGNOSIS — F411 Generalized anxiety disorder: Secondary | ICD-10-CM | POA: Insufficient documentation

## 2023-06-20 DIAGNOSIS — Z62810 Personal history of physical and sexual abuse in childhood: Secondary | ICD-10-CM | POA: Insufficient documentation

## 2023-06-20 DIAGNOSIS — F1020 Alcohol dependence, uncomplicated: Secondary | ICD-10-CM | POA: Diagnosis not present

## 2023-06-20 DIAGNOSIS — Z9151 Personal history of suicidal behavior: Secondary | ICD-10-CM | POA: Insufficient documentation

## 2023-06-20 DIAGNOSIS — Z79899 Other long term (current) drug therapy: Secondary | ICD-10-CM | POA: Insufficient documentation

## 2023-06-20 LAB — COMPREHENSIVE METABOLIC PANEL WITH GFR
ALT: 33 U/L (ref 0–44)
AST: 73 U/L — ABNORMAL HIGH (ref 15–41)
Albumin: 4.1 g/dL (ref 3.5–5.0)
Alkaline Phosphatase: 108 U/L (ref 38–126)
Anion gap: 10 (ref 5–15)
BUN: 5 mg/dL — ABNORMAL LOW (ref 6–20)
CO2: 28 mmol/L (ref 22–32)
Calcium: 8.3 mg/dL — ABNORMAL LOW (ref 8.9–10.3)
Chloride: 100 mmol/L (ref 98–111)
Creatinine, Ser: 0.85 mg/dL (ref 0.61–1.24)
GFR, Estimated: >60 mL/min (ref >60)
Glucose, Bld: 118 mg/dL — ABNORMAL HIGH (ref 70–99)
Potassium: 3.3 mmol/L — ABNORMAL LOW (ref 3.5–5.1)
Sodium: 138 mmol/L (ref 135–145)
Total Bilirubin: 1.2 mg/dL (ref 0.0–1.2)
Total Protein: 8.1 g/dL (ref 6.5–8.1)

## 2023-06-20 LAB — POCT URINE DRUG SCREEN - MANUAL ENTRY (I-SCREEN)
POC Amphetamine UR: NOT DETECTED
POC Buprenorphine (BUP): NOT DETECTED
POC Cocaine UR: NOT DETECTED
POC Marijuana UR: POSITIVE — AB
POC Methadone UR: NOT DETECTED
POC Methamphetamine UR: NOT DETECTED
POC Morphine: NOT DETECTED
POC Oxazepam (BZO): NOT DETECTED
POC Oxycodone UR: NOT DETECTED
POC Secobarbital (BAR): NOT DETECTED

## 2023-06-20 LAB — URINALYSIS, ROUTINE W REFLEX MICROSCOPIC
Bacteria, UA: NONE SEEN
Bilirubin Urine: NEGATIVE
Glucose, UA: NEGATIVE mg/dL
Hgb urine dipstick: NEGATIVE
Ketones, ur: NEGATIVE mg/dL
Leukocytes,Ua: NEGATIVE
Nitrite: NEGATIVE
Protein, ur: NEGATIVE mg/dL
Specific Gravity, Urine: 1.011 (ref 1.005–1.030)
pH: 7 (ref 5.0–8.0)

## 2023-06-20 LAB — CBC WITH DIFFERENTIAL/PLATELET
Abs Immature Granulocytes: 0.02 10*3/uL (ref 0.00–0.07)
Basophils Absolute: 0.1 10*3/uL (ref 0.0–0.1)
Basophils Relative: 2 %
Eosinophils Absolute: 0.2 10*3/uL (ref 0.0–0.5)
Eosinophils Relative: 4 %
HCT: 40.8 % (ref 39.0–52.0)
Hemoglobin: 13.8 g/dL (ref 13.0–17.0)
Immature Granulocytes: 0 %
Lymphocytes Relative: 41 %
Lymphs Abs: 2.7 10*3/uL (ref 0.7–4.0)
MCH: 30.2 pg (ref 26.0–34.0)
MCHC: 33.8 g/dL (ref 30.0–36.0)
MCV: 89.3 fL (ref 80.0–100.0)
Monocytes Absolute: 0.4 10*3/uL (ref 0.1–1.0)
Monocytes Relative: 7 %
Neutro Abs: 3.1 10*3/uL (ref 1.7–7.7)
Neutrophils Relative %: 46 %
Platelets: 152 10*3/uL (ref 150–400)
RBC: 4.57 MIL/uL (ref 4.22–5.81)
RDW: 19.2 % — ABNORMAL HIGH (ref 11.5–15.5)
WBC: 6.6 10*3/uL (ref 4.0–10.5)
nRBC: 0 % (ref 0.0–0.2)

## 2023-06-20 LAB — LIPID PANEL
Cholesterol: 248 mg/dL — ABNORMAL HIGH (ref 0–200)
HDL: 53 mg/dL (ref >40)
LDL Cholesterol: UNDETERMINED mg/dL (ref 0–99)
Total CHOL/HDL Ratio: 4.7 ratio
Triglycerides: 1148 mg/dL — ABNORMAL HIGH (ref <150)
VLDL: UNDETERMINED mg/dL (ref 0–40)

## 2023-06-20 LAB — GLUCOSE, CAPILLARY: Glucose-Capillary: 133 mg/dL — ABNORMAL HIGH (ref 70–99)

## 2023-06-20 LAB — VITAMIN D 25 HYDROXY (VIT D DEFICIENCY, FRACTURES): Vit D, 25-Hydroxy: 22.56 ng/mL — ABNORMAL LOW (ref 30–100)

## 2023-06-20 LAB — HEMOGLOBIN A1C
Hgb A1c MFr Bld: 5.4 % (ref 4.8–5.6)
Mean Plasma Glucose: 108.28 mg/dL

## 2023-06-20 LAB — ETHANOL: Alcohol, Ethyl (B): 285 mg/dL — ABNORMAL HIGH (ref <15)

## 2023-06-20 LAB — TSH: TSH: 158.216 u[IU]/mL — ABNORMAL HIGH (ref 0.350–4.500)

## 2023-06-20 LAB — VITAMIN B12: Vitamin B-12: 295 pg/mL (ref 180–914)

## 2023-06-20 LAB — LDL CHOLESTEROL, DIRECT: Direct LDL: 103 mg/dL — ABNORMAL HIGH (ref 0–99)

## 2023-06-20 MED ORDER — ONDANSETRON 4 MG PO TBDP: 4.0000 mg | ORAL_TABLET | Freq: Four times a day (QID) | ORAL | Status: DC | PRN

## 2023-06-20 MED ORDER — ACETAMINOPHEN 325 MG PO TABS: 650.0000 mg | ORAL_TABLET | Freq: Four times a day (QID) | ORAL | Status: DC | PRN

## 2023-06-20 MED ORDER — LORAZEPAM 2 MG/ML IJ SOLN: 2.0000 mg | Freq: Three times a day (TID) | INTRAMUSCULAR | Status: DC | PRN

## 2023-06-20 MED ORDER — POTASSIUM CHLORIDE CRYS ER 20 MEQ PO TBCR
40.0000 meq | EXTENDED_RELEASE_TABLET | Freq: Once | ORAL | Status: AC
  Administered 2023-06-20: 40 meq via ORAL
  Filled 2023-06-20: qty 2

## 2023-06-20 MED ORDER — THIAMINE HCL 100 MG/ML IJ SOLN
100.0000 mg | Freq: Once | INTRAMUSCULAR | Status: AC
  Administered 2023-06-20: 100 mg via INTRAMUSCULAR
  Filled 2023-06-20: qty 2

## 2023-06-20 MED ORDER — MAGNESIUM HYDROXIDE 400 MG/5ML PO SUSP: 30.0000 mL | Freq: Every day | ORAL | Status: DC | PRN

## 2023-06-20 MED ORDER — OLANZAPINE 5 MG PO TBDP: 5.0000 mg | ORAL_TABLET | Freq: Three times a day (TID) | ORAL | Status: DC | PRN

## 2023-06-20 MED ORDER — LOPERAMIDE HCL 2 MG PO CAPS: 2.0000 mg | ORAL_CAPSULE | ORAL | Status: DC | PRN

## 2023-06-20 MED ORDER — SERTRALINE HCL 50 MG PO TABS
50.0000 mg | ORAL_TABLET | Freq: Every day | ORAL | Status: DC
  Administered 2023-06-20: 50 mg via ORAL
  Filled 2023-06-20: qty 1

## 2023-06-20 MED ORDER — TRAZODONE HCL 50 MG PO TABS
50.0000 mg | ORAL_TABLET | Freq: Every evening | ORAL | Status: DC | PRN
  Administered 2023-06-20: 50 mg via ORAL
  Filled 2023-06-20: qty 1

## 2023-06-20 MED ORDER — THIAMINE MONONITRATE 100 MG PO TABS: 100.0000 mg | ORAL_TABLET | Freq: Every day | ORAL | Status: DC

## 2023-06-20 MED ORDER — LORAZEPAM 1 MG PO TABS: 1.0000 mg | ORAL_TABLET | Freq: Four times a day (QID) | ORAL | Status: DC | PRN

## 2023-06-20 MED ORDER — HALOPERIDOL LACTATE 5 MG/ML IJ SOLN: 10.0000 mg | Freq: Three times a day (TID) | INTRAMUSCULAR | Status: DC | PRN

## 2023-06-20 MED ORDER — ALUM & MAG HYDROXIDE-SIMETH 200-200-20 MG/5ML PO SUSP: 30.0000 mL | ORAL | Status: DC | PRN

## 2023-06-20 MED ORDER — DIPHENHYDRAMINE HCL 50 MG/ML IJ SOLN: 50.0000 mg | Freq: Three times a day (TID) | INTRAMUSCULAR | Status: DC | PRN

## 2023-06-20 MED ORDER — LORAZEPAM 1 MG PO TABS: 1.0000 mg | ORAL_TABLET | Freq: Three times a day (TID) | ORAL | Status: DC

## 2023-06-20 MED ORDER — HALOPERIDOL LACTATE 5 MG/ML IJ SOLN: 5.0000 mg | Freq: Three times a day (TID) | INTRAMUSCULAR | Status: DC | PRN

## 2023-06-20 MED ORDER — LORAZEPAM 1 MG PO TABS: 1.0000 mg | ORAL_TABLET | Freq: Every day | ORAL | Status: DC

## 2023-06-20 MED ORDER — ADULT MULTIVITAMIN W/MINERALS CH
1.0000 | ORAL_TABLET | Freq: Every day | ORAL | Status: DC
  Administered 2023-06-20: 1 via ORAL
  Filled 2023-06-20: qty 1

## 2023-06-20 MED ORDER — LORAZEPAM 1 MG PO TABS: 1.0000 mg | ORAL_TABLET | Freq: Two times a day (BID) | ORAL | Status: DC

## 2023-06-20 MED ORDER — HYDROXYZINE HCL 25 MG PO TABS: 25.0000 mg | ORAL_TABLET | Freq: Three times a day (TID) | ORAL | Status: DC | PRN

## 2023-06-20 MED ORDER — LORAZEPAM 1 MG PO TABS
1.0000 mg | ORAL_TABLET | Freq: Four times a day (QID) | ORAL | Status: DC
  Administered 2023-06-20 (×2): 1 mg via ORAL
  Filled 2023-06-20 (×2): qty 1

## 2023-06-20 NOTE — ED Notes (Signed)
Safe transport called for transport. 

## 2023-06-20 NOTE — ED Notes (Signed)
 Pt arrived to observation adult bed 3 from assessment.  Pt reported drink 5 mike hard lemonades since midnight.  Pt appears intoxicated. Pt lower bilateral legs are shiny and have a rash, pt reports as itchy, but has been putting  OTC products on to helpm with itching.   Slight swelling noted in left ankle area,  put has had feet in dependent position prior to assessment. Denied current SI plan and intent,  Denied HI and A/Vhallucinations
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
## 2023-06-20 NOTE — ED Notes (Signed)
 Patient is sleeping. Respirations equal and unlabored. No change in assessment or acuity. Routine safety checks conducted according to facility protocol.

## 2023-06-20 NOTE — Progress Notes (Signed)
 Pt has been accepted to [facility] on 06/20/2023 . Bed assignment:303-1   Pt meets inpatient criteria per Robet Chiquito, NP   Attending Physician will be Dr. Thirza Fleet  Report can be called to: - Adult unit: [phone]  Pt can arrive pending labs   Care Team Notified: [facility] Danika Sharilyn Davenport, NP, Keri Peat, RN   Albertus Alt MSW, LCSWA 06/20/2023  4:45PM
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
## 2023-06-20 NOTE — ED Notes (Signed)
 Patient alert and oriented. Denies SI/HI/AVH. Denies intent or plan to harm self or others. Routine conducted according to faculty protocol. Encourage patient to notify staff with any needs or concerns. Patient verbalized agreement and understanding.

## 2023-06-20 NOTE — ED Provider Notes (Addendum)
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 Buprenorphine (BUP) 01/20/2023 None Detected  NONE DETECTED (Cut Off Level 10 ng/mL) Final   POC Oxazepam (BZO) 01/20/2023 None Detected  NONE DETECTED (Cut Off Level 300 ng/mL) Final   POC Cocaine UR 01/20/2023 None Detected  NONE DETECTED (Cut Off Level 300 ng/mL) Final   POC Methamphetamine UR 01/20/2023 None Detected  NONE DETECTED (Cut Off Level 1000 ng/mL) Final   POC Morphine 01/20/2023 None Detected  NONE DETECTED (Cut Off Level 300 ng/mL) Final   POC Methadone UR 01/20/2023 None  Detected  NONE DETECTED (Cut Off Level 300 ng/mL) Final   POC Oxycodone UR 01/20/2023 None Detected  NONE DETECTED (Cut Off Level 100 ng/mL) Final   POC Marijuana UR 01/20/2023 None Detected  NONE DETECTED (Cut Off Level 50 ng/mL) Final   Direct LDL 01/20/2023 152 (H)  0 - 99 mg/dL Final   Performed at [facility], [address] [place], [place] [address]    Allergies: Patient has no known allergies.  Medications:  Facility Ordered Medications  Medication   acetaminophen  (TYLENOL ) tablet 650 mg   alum & mag hydroxide-simeth (MAALOX/MYLANTA) 200-200-20 MG/5ML suspension 30 mL   magnesium  hydroxide (MILK OF MAGNESIA) suspension 30 mL   OLANZapine zydis (ZYPREXA) disintegrating tablet 5 mg   haloperidol lactate (HALDOL) injection 5 mg   And   diphenhydrAMINE (BENADRYL) injection 50 mg   And   LORazepam  (ATIVAN ) injection 2 mg   haloperidol lactate (HALDOL) injection 10 mg   And   diphenhydrAMINE (BENADRYL) injection 50 mg   And   LORazepam  (ATIVAN ) injection 2 mg   hydrOXYzine  (ATARAX ) tablet 25 mg   traZODone  (DESYREL ) tablet 50 mg   PTA Medications  Medication Sig   sertraline  (ZOLOFT ) 100 MG tablet Take 1 tablet (100 mg total) by mouth daily. (Patient not taking: Reported on 06/20/2023)   levothyroxine  (SYNTHROID ) 200 MCG tablet Take 1 tablet (200 mcg total) by mouth daily before breakfast.   QUEtiapine  (SEROQUEL ) 50 MG tablet Take 1 tablet (50 mg total) by mouth at bedtime. (Patient not taking: Reported on 06/20/2023)   albuterol  (VENTOLIN  HFA) 108 (90 Base) MCG/ACT inhaler Inhale 1 puff into the lungs every 6 (six) hours as needed for wheezing or shortness of breath.    Medical Decision Making  -Recommending inpatient behavioral health treatment for stabilization of mental status, after which patient should be recommended for rehab at a at least 30 day program for rehab. -Will order Ativan  taper for detox -Ordered baseline labs-Including Ha1c, TSH, Lipid panel, Vit D,  B12, EKG, CBC, CMP, hepatitis panel due to history of elevatedf LFTs.   Recommendations  Based on my evaluation the patient appears to have an emergency mental health condition for which I recommend the patient be transferred to an inpatient behavioral health unit for treatment and stabilization.   Robet Chiquito, NP 06/20/23  3:17 PM

## 2023-06-21 ENCOUNTER — Inpatient Hospital Stay (HOSPITAL_COMMUNITY)
Admission: AD | Admit: 2023-06-21 | Discharge: 2023-06-26 | DRG: 885 | Disposition: A | Discharge status: Home / Self Care | Source: Intra-hospital | Attending: Psychiatry | Admitting: Psychiatry

## 2023-06-21 ENCOUNTER — Encounter (HOSPITAL_COMMUNITY): Payer: Self-pay | Admitting: Psychiatry

## 2023-06-21 ENCOUNTER — Other Ambulatory Visit: Payer: Self-pay

## 2023-06-21 DIAGNOSIS — F411 Generalized anxiety disorder: Secondary | ICD-10-CM | POA: Diagnosis present

## 2023-06-21 DIAGNOSIS — H538 Other visual disturbances: Secondary | ICD-10-CM | POA: Diagnosis not present

## 2023-06-21 DIAGNOSIS — Z62810 Personal history of physical and sexual abuse in childhood: Secondary | ICD-10-CM

## 2023-06-21 DIAGNOSIS — G4700 Insomnia, unspecified: Secondary | ICD-10-CM | POA: Diagnosis present

## 2023-06-21 DIAGNOSIS — E039 Hypothyroidism, unspecified: Secondary | ICD-10-CM | POA: Diagnosis present

## 2023-06-21 DIAGNOSIS — F332 Major depressive disorder, recurrent severe without psychotic features: Secondary | ICD-10-CM | POA: Diagnosis present

## 2023-06-21 DIAGNOSIS — E519 Thiamine deficiency, unspecified: Secondary | ICD-10-CM | POA: Diagnosis present

## 2023-06-21 DIAGNOSIS — Z8659 Personal history of other mental and behavioral disorders: Secondary | ICD-10-CM | POA: Diagnosis not present

## 2023-06-21 DIAGNOSIS — Z9151 Personal history of suicidal behavior: Secondary | ICD-10-CM

## 2023-06-21 DIAGNOSIS — R197 Diarrhea, unspecified: Secondary | ICD-10-CM | POA: Diagnosis present

## 2023-06-21 DIAGNOSIS — E781 Pure hyperglyceridemia: Secondary | ICD-10-CM | POA: Diagnosis present

## 2023-06-21 DIAGNOSIS — J45909 Unspecified asthma, uncomplicated: Secondary | ICD-10-CM | POA: Diagnosis present

## 2023-06-21 DIAGNOSIS — Z811 Family history of alcohol abuse and dependence: Secondary | ICD-10-CM | POA: Diagnosis not present

## 2023-06-21 DIAGNOSIS — E785 Hyperlipidemia, unspecified: Secondary | ICD-10-CM | POA: Diagnosis present

## 2023-06-21 DIAGNOSIS — F909 Attention-deficit hyperactivity disorder, unspecified type: Secondary | ICD-10-CM | POA: Diagnosis present

## 2023-06-21 DIAGNOSIS — Z79890 Hormone replacement therapy (postmenopausal): Secondary | ICD-10-CM

## 2023-06-21 DIAGNOSIS — Z639 Problem related to primary support group, unspecified: Secondary | ICD-10-CM

## 2023-06-21 DIAGNOSIS — Z79899 Other long term (current) drug therapy: Secondary | ICD-10-CM

## 2023-06-21 DIAGNOSIS — Z62811 Personal history of psychological abuse in childhood: Secondary | ICD-10-CM

## 2023-06-21 DIAGNOSIS — F17290 Nicotine dependence, other tobacco product, uncomplicated: Secondary | ICD-10-CM | POA: Diagnosis present

## 2023-06-21 DIAGNOSIS — Z87820 Personal history of traumatic brain injury: Secondary | ICD-10-CM

## 2023-06-21 DIAGNOSIS — Z91410 Personal history of adult physical and sexual abuse: Secondary | ICD-10-CM | POA: Diagnosis not present

## 2023-06-21 DIAGNOSIS — F1020 Alcohol dependence, uncomplicated: Secondary | ICD-10-CM | POA: Diagnosis present

## 2023-06-21 DIAGNOSIS — F17200 Nicotine dependence, unspecified, uncomplicated: Secondary | ICD-10-CM | POA: Insufficient documentation

## 2023-06-21 LAB — BASIC METABOLIC PANEL WITH GFR
Anion gap: 10 (ref 5–15)
BUN: 10 mg/dL (ref 6–20)
CO2: 26 mmol/L (ref 22–32)
Calcium: 8.7 mg/dL — ABNORMAL LOW (ref 8.9–10.3)
Chloride: 102 mmol/L (ref 98–111)
Creatinine, Ser: 0.83 mg/dL (ref 0.61–1.24)
GFR, Estimated: >60 mL/min (ref >60)
Glucose, Bld: 96 mg/dL (ref 70–99)
Potassium: 3.8 mmol/L (ref 3.5–5.1)
Sodium: 138 mmol/L (ref 135–145)

## 2023-06-21 LAB — LIPID PANEL
Cholesterol: 238 mg/dL — ABNORMAL HIGH (ref 0–200)
HDL: 48 mg/dL (ref >40)
LDL Cholesterol: UNDETERMINED mg/dL (ref 0–99)
Total CHOL/HDL Ratio: 5 ratio
Triglycerides: 966 mg/dL — ABNORMAL HIGH (ref <150)
VLDL: UNDETERMINED mg/dL (ref 0–40)

## 2023-06-21 LAB — HEPATITIS PANEL, ACUTE
HCV Ab: NONREACTIVE
Hep A IgM: NONREACTIVE
Hep B C IgM: NONREACTIVE
Hepatitis B Surface Ag: REACTIVE — AB

## 2023-06-21 MED ORDER — DIPHENHYDRAMINE HCL 50 MG/ML IJ SOLN: 50.0000 mg | Freq: Three times a day (TID) | INTRAMUSCULAR | Status: DC | PRN

## 2023-06-21 MED ORDER — NICOTINE 14 MG/24HR TD PT24
14.0000 mg | MEDICATED_PATCH | Freq: Every day | TRANSDERMAL | Status: DC
  Administered 2023-06-21 – 2023-06-23 (×3): 14 mg via TRANSDERMAL
  Filled 2023-06-21 (×4): qty 1

## 2023-06-21 MED ORDER — HALOPERIDOL LACTATE 5 MG/ML IJ SOLN: 10.0000 mg | Freq: Three times a day (TID) | INTRAMUSCULAR | Status: DC | PRN

## 2023-06-21 MED ORDER — SERTRALINE HCL 50 MG PO TABS
50.0000 mg | ORAL_TABLET | Freq: Every day | ORAL | Status: DC
  Administered 2023-06-21: 50 mg via ORAL
  Filled 2023-06-21: qty 1

## 2023-06-21 MED ORDER — LEVOTHYROXINE SODIUM 100 MCG PO TABS
200.0000 ug | ORAL_TABLET | Freq: Every day | ORAL | Status: DC
Start: 2023-06-21 — End: 2023-06-26
  Administered 2023-06-21 – 2023-06-26 (×6): 200 ug via ORAL
  Filled 2023-06-21 (×2): qty 2
  Filled 2023-06-21: qty 14
  Filled 2023-06-21 (×4): qty 2

## 2023-06-21 MED ORDER — LORAZEPAM 1 MG PO TABS: 1.0000 mg | ORAL_TABLET | Freq: Four times a day (QID) | ORAL | Status: AC | PRN

## 2023-06-21 MED ORDER — SERTRALINE HCL 50 MG PO TABS
50.0000 mg | ORAL_TABLET | Freq: Once | ORAL | Status: AC
  Administered 2023-06-21: 50 mg via ORAL
  Filled 2023-06-21: qty 1

## 2023-06-21 MED ORDER — LORAZEPAM 2 MG/ML IJ SOLN: 2.0000 mg | Freq: Three times a day (TID) | INTRAMUSCULAR | Status: DC | PRN

## 2023-06-21 MED ORDER — ALUM & MAG HYDROXIDE-SIMETH 200-200-20 MG/5ML PO SUSP: 30.0000 mL | ORAL | Status: DC | PRN

## 2023-06-21 MED ORDER — LORAZEPAM 1 MG PO TABS
1.0000 mg | ORAL_TABLET | Freq: Every day | ORAL | Status: AC
  Administered 2023-06-24: 1 mg via ORAL
  Filled 2023-06-21: qty 1

## 2023-06-21 MED ORDER — ALBUTEROL SULFATE HFA 108 (90 BASE) MCG/ACT IN AERS: 1.0000 | INHALATION_SPRAY | Freq: Four times a day (QID) | RESPIRATORY_TRACT | Status: DC | PRN

## 2023-06-21 MED ORDER — HYDROXYZINE HCL 25 MG PO TABS
25.0000 mg | ORAL_TABLET | Freq: Three times a day (TID) | ORAL | Status: DC | PRN
  Administered 2023-06-24 – 2023-06-25 (×2): 25 mg via ORAL
  Filled 2023-06-21 (×2): qty 1
  Filled 2023-06-21: qty 10
  Filled 2023-06-21: qty 1

## 2023-06-21 MED ORDER — LORAZEPAM 1 MG PO TABS
1.0000 mg | ORAL_TABLET | Freq: Three times a day (TID) | ORAL | Status: AC
  Administered 2023-06-22 (×3): 1 mg via ORAL
  Filled 2023-06-21 (×3): qty 1

## 2023-06-21 MED ORDER — TRAZODONE HCL 50 MG PO TABS: 50.0000 mg | ORAL_TABLET | Freq: Every evening | ORAL | Status: DC | PRN

## 2023-06-21 MED ORDER — ACETAMINOPHEN 325 MG PO TABS: 650.0000 mg | ORAL_TABLET | Freq: Four times a day (QID) | ORAL | Status: DC | PRN

## 2023-06-21 MED ORDER — QUETIAPINE FUMARATE 50 MG PO TABS
50.0000 mg | ORAL_TABLET | Freq: Every evening | ORAL | Status: DC | PRN
  Administered 2023-06-21 – 2023-06-25 (×5): 50 mg via ORAL
  Filled 2023-06-21 (×2): qty 1
  Filled 2023-06-21: qty 7
  Filled 2023-06-21 (×3): qty 1

## 2023-06-21 MED ORDER — LORAZEPAM 1 MG PO TABS
1.0000 mg | ORAL_TABLET | Freq: Two times a day (BID) | ORAL | Status: AC
  Administered 2023-06-23 (×2): 1 mg via ORAL
  Filled 2023-06-21 (×2): qty 1

## 2023-06-21 MED ORDER — ONDANSETRON 4 MG PO TBDP: 4.0000 mg | ORAL_TABLET | Freq: Four times a day (QID) | ORAL | Status: AC | PRN

## 2023-06-21 MED ORDER — LOPERAMIDE HCL 2 MG PO CAPS: 2.0000 mg | ORAL_CAPSULE | ORAL | Status: AC | PRN

## 2023-06-21 MED ORDER — OLANZAPINE 5 MG PO TBDP: 5.0000 mg | ORAL_TABLET | Freq: Three times a day (TID) | ORAL | Status: DC | PRN

## 2023-06-21 MED ORDER — VITAMIN B-1 100 MG PO TABS
100.0000 mg | ORAL_TABLET | Freq: Every day | ORAL | Status: DC
  Administered 2023-06-21 – 2023-06-26 (×6): 100 mg via ORAL
  Filled 2023-06-21: qty 7
  Filled 2023-06-21 (×5): qty 1

## 2023-06-21 MED ORDER — SERTRALINE HCL 100 MG PO TABS
100.0000 mg | ORAL_TABLET | Freq: Every day | ORAL | Status: DC
  Administered 2023-06-22 – 2023-06-26 (×5): 100 mg via ORAL
  Filled 2023-06-21 (×3): qty 1
  Filled 2023-06-21: qty 7
  Filled 2023-06-21 (×2): qty 1

## 2023-06-21 MED ORDER — MAGNESIUM HYDROXIDE 400 MG/5ML PO SUSP: 30.0000 mL | Freq: Every day | ORAL | Status: DC | PRN

## 2023-06-21 MED ORDER — HALOPERIDOL LACTATE 5 MG/ML IJ SOLN: 5.0000 mg | Freq: Three times a day (TID) | INTRAMUSCULAR | Status: DC | PRN

## 2023-06-21 MED ORDER — LORAZEPAM 1 MG PO TABS
1.0000 mg | ORAL_TABLET | Freq: Four times a day (QID) | ORAL | Status: AC
  Administered 2023-06-21 (×4): 1 mg via ORAL
  Filled 2023-06-21 (×4): qty 1

## 2023-06-21 MED ORDER — VITAMIN B-12 1000 MCG PO TABS
1000.0000 ug | ORAL_TABLET | Freq: Every day | ORAL | Status: DC
  Administered 2023-06-21 – 2023-06-26 (×6): 1000 ug via ORAL
  Filled 2023-06-21: qty 1
  Filled 2023-06-21: qty 7
  Filled 2023-06-21 (×4): qty 1

## 2023-06-21 MED ORDER — ADULT MULTIVITAMIN W/MINERALS CH
1.0000 | ORAL_TABLET | Freq: Every day | ORAL | Status: DC
  Administered 2023-06-21 – 2023-06-26 (×6): 1 via ORAL
  Filled 2023-06-21 (×6): qty 1

## 2023-06-21 NOTE — Plan of Care (Signed)
  Problem: Education: Goal: Knowledge of [facility] Education information/materials will improve Outcome: Progressing Goal: Emotional status will improve Outcome: Progressing Goal: Mental status will improve Outcome: Progressing   Problem: Physical Regulation: Goal: Ability to maintain clinical measurements within normal limits will improve Outcome: Progressing   Problem: Safety: Goal: Periods of time without injury will increase Outcome: Progressing

## 2023-06-21 NOTE — Group Note (Signed)
 Date:  06/21/2023 Time:  8:50 AM  Group Topic/Focus:  Goals Group:   The focus of this group is to help patients establish daily goals to achieve during treatment and discuss how the patient can incorporate goal setting into their daily lives to aide in recovery. Orientation:   The focus of this group is to educate the patient on the purpose and policies of crisis stabilization and provide a format to answer questions about their admission.  The group details unit policies and expectations of patients while admitted.    Participation Level:  Did Not Attend
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
## 2023-06-21 NOTE — Group Note (Signed)
 Recreation Therapy Group Note   Group Topic:Animal Assisted Therapy   Group Date: 06/21/2023 Start Time: 0946 End Time: 1030 Facilitators: Londen Bok-McCall, LRT,CTRS Location: [address] Dayroom   Animal-Assisted Activity (AAA) Program Checklist/Progress Notes Patient Eligibility Criteria Checklist & Daily Group note for Rec Tx Intervention  AAA/T Program Assumption of Risk Form signed by Patient/ or Parent Legal Guardian Yes  Patient understands his/her participation is voluntary Yes  Behavioral Response:    Education: Charity fundraiser, Appropriate Animal Interaction   Education Outcome: Acknowledges education.    Affect/Mood: N/A   Participation Level: Did not attend    Clinical Observations/Individualized Feedback:    Plan: Continue to engage patient in RT group sessions 2-3x/week.   Deandrew Hoecker-McCall, LRT,CTRS 06/21/2023 12:20 PM

## 2023-06-21 NOTE — Group Note (Addendum)
 LCSW Group Therapy Note   Group Date: 06/21/2023 Start Time: 1100 End Time: 1200   Participation:  did not attend  Type of Therapy:  Group Therapy  Topic:  Speaking from the Heart: Communicating with Understanding and Empathy  Objective:  To help participants develop effective communication skills to express themselves clearly, listen actively, and navigate conflicts in a healthy way.  Goals: Increase awareness of verbal and non-verbal communication skills. Practice using "I" statements and active listening techniques. Learn coping strategies for managing communication stress.  Summary:  Participants explored the importance of communication, discussed challenges, and practiced skills such as active listening and assertive expression. They reflected on past experiences and identified ways to improve communication in their daily lives.  Therapeutic Modalities: Cognitive-Behavioral Therapy (CBT): Restructuring negative thought patterns in communication. Mindfulness: Staying present and calm during conversations. Psychoeducation: Learning about effective communication techniques.   Maynard Mohammedali O Cameron Schwinn, LCSWA 06/21/2023  5:46 PM

## 2023-06-21 NOTE — Plan of Care (Signed)
   Problem: Education: Goal: Knowledge of [facility] Education information/materials will improve Outcome: Progressing Goal: Emotional status will improve Outcome: Progressing Goal: Mental status will improve Outcome: Progressing Goal: Verbalization of understanding the information provided will improve Outcome: Progressing

## 2023-06-21 NOTE — [facility] Group Notes (Signed)
 [facility] Group Notes:  (Nursing/MHT/Case Management/Adjunct)  Date:  06/21/2023  Time:  2020  Type of Therapy:  Wrap up group  Participation Level:  Active  Participation Quality:  Appropriate, Attentive, Sharing, and Supportive  Affect:  Depressed  Cognitive:  Alert  Insight:  Improving  Engagement in Group:  Engaged  Modes of Intervention:  Clarification, Education, and Support  Summary of Progress/Problems: Positive thinking and self-care were discussed.   Danny West 06/21/2023, 9:23 PM

## 2023-06-21 NOTE — Tx Team (Signed)
 Initial Treatment Plan 06/21/2023 2:03 AM Danny West DGL:[id number]    PATIENT STRESSORS: Financial difficulties   Marital or family conflict   Substance abuse     PATIENT STRENGTHS: Active sense of humor  Average or above average intelligence  Capable of independent living  Communication skills  Motivation for treatment/growth    PATIENT IDENTIFIED PROBLEMS: Alcohol use d/o  Depression  Anxiety  ("I want detox and to stop drinking.")               DISCHARGE CRITERIA:  Improved stabilization in mood, thinking, and/or behavior Motivation to continue treatment in a less acute level of care Withdrawal symptoms are absent or subacute and managed without 24-hour nursing intervention  PRELIMINARY DISCHARGE PLAN: Attend aftercare/continuing care group Outpatient therapy Return to previous living arrangement Return to previous work or school arrangements  PATIENT/FAMILY INVOLVEMENT: This treatment plan has been presented to and reviewed with the patient, Danny West, and/or family member.  The patient and family have been given the opportunity to ask questions and make suggestions.  Margaret Sharp, RN 06/21/2023, 2:03 AM

## 2023-06-21 NOTE — Progress Notes (Signed)
   06/21/23 1400  Psych Admission Type (Psych Patients Only)  Admission Status Voluntary  Psychosocial Assessment  Patient Complaints Substance abuse;Depression;Sleep disturbance  Eye Contact Fair  Facial Expression Animated  Affect Appropriate to circumstance  Speech Slow  Interaction Assertive  Motor Activity Slow  Appearance/Hygiene Unremarkable  Behavior Characteristics Cooperative  Mood Pleasant;Euthymic  Thought Process  Coherency WDL  Content WDL  Delusions None reported or observed  Perception WDL  Hallucination None reported or observed  Judgment Impaired  Confusion None  Danger to Self  Current suicidal ideation? Denies  Agreement Not to Harm Self Yes  Description of Agreement Verbal  Danger to Others  Danger to Others None reported or observed

## 2023-06-21 NOTE — Progress Notes (Signed)
   06/21/23 0040  Psychosocial Assessment  Patient Complaints Substance abuse;Depression;Anxiety  Eye Contact Fair  Facial Expression Animated  Affect Appropriate to circumstance  Speech Slow;Slurred (due to recent alcohol use)  Interaction Assertive  Motor Activity Slow;Unsteady  Appearance/Hygiene Unremarkable  Behavior Characteristics Cooperative;Appropriate to situation;Anxious  Mood Depressed;Anxious;Pleasant  Thought Process  Coherency WDL  Content WDL  Delusions None reported or observed  Perception WDL  Hallucination None reported or observed  Judgment Impaired  Confusion None  Danger to Self  Current suicidal ideation? Denies  Agreement Not to Harm Self Yes  Description of Agreement verbal  Danger to Others  Danger to Others None reported or observed

## 2023-06-22 ENCOUNTER — Encounter (HOSPITAL_COMMUNITY): Payer: Self-pay

## 2023-06-22 DIAGNOSIS — F17200 Nicotine dependence, unspecified, uncomplicated: Secondary | ICD-10-CM | POA: Insufficient documentation

## 2023-06-22 DIAGNOSIS — J45909 Unspecified asthma, uncomplicated: Secondary | ICD-10-CM | POA: Insufficient documentation

## 2023-06-22 DIAGNOSIS — F411 Generalized anxiety disorder: Secondary | ICD-10-CM | POA: Insufficient documentation

## 2023-06-22 DIAGNOSIS — F332 Major depressive disorder, recurrent severe without psychotic features: Secondary | ICD-10-CM | POA: Diagnosis not present

## 2023-06-22 LAB — T4, FREE: Free T4: <0.25 ng/dL — ABNORMAL LOW (ref 0.61–1.12)

## 2023-06-22 LAB — LDL CHOLESTEROL, DIRECT: Direct LDL: 122 mg/dL — ABNORMAL HIGH (ref 0–99)

## 2023-06-22 NOTE — BH IP Treatment Plan (Signed)
 Interdisciplinary Treatment and Diagnostic Plan Update  06/22/2023 Time of Session: 10:10 AM Danny West MRN: [id number]  Principal Diagnosis: MDD (major depressive disorder), recurrent severe, without psychosis (HCC)  Secondary Diagnoses: Principal Problem:   MDD (major depressive disorder), recurrent severe, without psychosis (HCC) Active Problems:   Acquired hypothyroidism   Alcohol use disorder, severe, dependence (HCC)   HLD (hyperlipidemia)   GAD (generalized anxiety disorder)   Asthma   Tobacco use disorder   Current Medications:  Current Facility-Administered Medications  Medication Dose Route Frequency Provider Last Rate Last Admin   acetaminophen  (TYLENOL ) tablet 650 mg  650 mg Oral Q6H PRN Nkwenti, Doris, NP       albuterol  (VENTOLIN  HFA) 108 (90 Base) MCG/ACT inhaler 1 puff  1 puff Inhalation Q6H PRN Dorthea Gauze, NP       alum & mag hydroxide-simeth (MAALOX/MYLANTA) 200-200-20 MG/5ML suspension 30 mL  30 mL Oral Q4H PRN Nkwenti, Doris, NP       cyanocobalamin  (VITAMIN B12) tablet 1,000 mcg  1,000 mcg Oral Daily Zouev, Dmitri, MD   1,000 mcg at 06/22/23 0829   haloperidol lactate (HALDOL) injection 10 mg  10 mg Intramuscular TID PRN Robet Chiquito, NP       And   diphenhydrAMINE (BENADRYL) injection 50 mg  50 mg Intramuscular TID PRN Robet Chiquito, NP       And   LORazepam  (ATIVAN ) injection 2 mg  2 mg Intramuscular TID PRN Robet Chiquito, NP       haloperidol lactate (HALDOL) injection 5 mg  5 mg Intramuscular TID PRN Robet Chiquito, NP       And   diphenhydrAMINE (BENADRYL) injection 50 mg  50 mg Intramuscular TID PRN Robet Chiquito, NP       And   LORazepam  (ATIVAN ) injection 2 mg  2 mg Intramuscular TID PRN Robet Chiquito, NP       hydrOXYzine  (ATARAX ) tablet 25 mg  25 mg Oral TID PRN Robet Chiquito, NP       levothyroxine  (SYNTHROID ) tablet 200 mcg  200 mcg Oral Q0600 Dorthea Gauze, NP   200 mcg at 06/22/23 4098   loperamide  (IMODIUM ) capsule 2-4 mg  2-4 mg  Oral PRN Nkwenti, Doris, NP       LORazepam  (ATIVAN ) tablet 1 mg  1 mg Oral Q6H PRN Robet Chiquito, NP       [START ON 06/23/2023] LORazepam  (ATIVAN ) tablet 1 mg  1 mg Oral BID Robet Chiquito, NP       Followed by   Cecily Cohen ON 06/24/2023] LORazepam  (ATIVAN ) tablet 1 mg  1 mg Oral Daily Nkwenti, Doris, NP       magnesium  hydroxide (MILK OF MAGNESIA) suspension 30 mL  30 mL Oral Daily PRN Robet Chiquito, NP       multivitamin with minerals tablet 1 tablet  1 tablet Oral Daily Robet Chiquito, NP   1 tablet at 06/22/23 1191   nicotine  (NICODERM CQ  - dosed in mg/24 hours) patch 14 mg  14 mg Transdermal Daily Dorthea Gauze, NP   14 mg at 06/22/23 0829   OLANZapine zydis (ZYPREXA) disintegrating tablet 5 mg  5 mg Oral TID PRN Robet Chiquito, NP       ondansetron  (ZOFRAN -ODT) disintegrating tablet 4 mg  4 mg Oral Q6H PRN Nkwenti, Doris, NP       QUEtiapine  (SEROQUEL ) tablet 50 mg  50 mg Oral QHS PRN Onuoha, Chinwendu V, NP   50 mg at 06/21/23 2245   sertraline  (ZOLOFT ) tablet 100 mg  100 mg Oral Daily Zouev, Dmitri, MD   100 mg at 06/22/23 9518   thiamine  (Vitamin B-1) tablet 100 mg  100 mg Oral Daily Nkwenti, Doris, NP   100 mg at 06/22/23 8416   PTA Medications: Medications Prior to Admission  Medication Sig Dispense Refill Last Dose/Taking   albuterol  (VENTOLIN  HFA) 108 (90 Base) MCG/ACT inhaler Inhale 1 puff into the lungs every 6 (six) hours as needed for wheezing or shortness of breath.   Unknown   levothyroxine  (SYNTHROID ) 200 MCG tablet Take 1 tablet (200 mcg total) by mouth daily before breakfast. 30 tablet 0 Unknown   QUEtiapine  (SEROQUEL ) 50 MG tablet Take 1 tablet (50 mg total) by mouth at bedtime. (Patient not taking: Reported on 06/20/2023) 30 tablet 0 Unknown   sertraline  (ZOLOFT ) 100 MG tablet Take 1 tablet (100 mg total) by mouth daily. (Patient not taking: Reported on 06/20/2023) 30 tablet 0 Unknown    Patient Stressors: Financial difficulties   Marital or family conflict   Substance  abuse    Patient Strengths: Active sense of humor  Average or above average intelligence  Capable of independent living  Manufacturing systems engineer  Motivation for treatment/growth   Treatment Modalities: Medication Management, Group therapy, Case management,  1 to 1 session with clinician, Psychoeducation, Recreational therapy.   Physician Treatment Plan for Primary Diagnosis: MDD (major depressive disorder), recurrent severe, without psychosis (HCC) Long Term Goal(s): Improvement in symptoms so as ready for discharge   Short Term Goals: Ability to identify changes in lifestyle to reduce recurrence of condition will improve Ability to verbalize feelings will improve Ability to disclose and discuss suicidal ideas Ability to demonstrate self-control will improve Ability to identify and develop effective coping behaviors will improve Ability to maintain clinical measurements within normal limits will improve Compliance with prescribed medications will improve Ability to identify triggers associated with substance abuse/mental health issues will improve  Medication Management: Evaluate patient's response, side effects, and tolerance of medication regimen.  Therapeutic Interventions: 1 to 1 sessions, Unit Group sessions and Medication administration.  Evaluation of Outcomes: Not Progressing  Physician Treatment Plan for Secondary Diagnosis: Principal Problem:   MDD (major depressive disorder), recurrent severe, without psychosis (HCC) Active Problems:   Acquired hypothyroidism   Alcohol use disorder, severe, dependence (HCC)   HLD (hyperlipidemia)   GAD (generalized anxiety disorder)   Asthma   Tobacco use disorder  Long Term Goal(s): Improvement in symptoms so as ready for discharge   Short Term Goals: Ability to identify changes in lifestyle to reduce recurrence of condition will improve Ability to verbalize feelings will improve Ability to disclose and discuss suicidal  ideas Ability to demonstrate self-control will improve Ability to identify and develop effective coping behaviors will improve Ability to maintain clinical measurements within normal limits will improve Compliance with prescribed medications will improve Ability to identify triggers associated with substance abuse/mental health issues will improve     Medication Management: Evaluate patient's response, side effects, and tolerance of medication regimen.  Therapeutic Interventions: 1 to 1 sessions, Unit Group sessions and Medication administration.  Evaluation of Outcomes: Not Progressing   RN Treatment Plan for Primary Diagnosis: MDD (major depressive disorder), recurrent severe, without psychosis (HCC) Long Term Goal(s): Knowledge of disease and therapeutic regimen to maintain health will improve  Short Term Goals: Ability to remain free from injury will improve, Ability to verbalize frustration and anger appropriately will improve, Ability to demonstrate self-control, Ability to participate in decision making will improve, Ability to verbalize  feelings will improve, Ability to disclose and discuss suicidal ideas, Ability to identify and develop effective coping behaviors will improve, and Compliance with prescribed medications will improve  Medication Management: RN will administer medications as ordered by provider, will assess and evaluate patient's response and provide education to patient for prescribed medication. RN will report any adverse and/or side effects to prescribing provider.  Therapeutic Interventions: 1 on 1 counseling sessions, Psychoeducation, Medication administration, Evaluate responses to treatment, Monitor vital signs and CBGs as ordered, Perform/monitor CIWA, COWS, AIMS and Fall Risk screenings as ordered, Perform wound care treatments as ordered.  Evaluation of Outcomes: Not Progressing   LCSW Treatment Plan for Primary Diagnosis: MDD (major depressive disorder),  recurrent severe, without psychosis (HCC) Long Term Goal(s): Safe transition to appropriate next level of care at discharge, Engage patient in therapeutic group addressing interpersonal concerns.  Short Term Goals: Engage patient in aftercare planning with referrals and resources, Increase social support, Increase ability to appropriately verbalize feelings, Increase emotional regulation, Facilitate acceptance of mental health diagnosis and concerns, Facilitate patient progression through stages of change regarding substance use diagnoses and concerns, Identify triggers associated with mental health/substance abuse issues, and Increase skills for wellness and recovery  Therapeutic Interventions: Assess for all discharge needs, 1 to 1 time with Social worker, Explore available resources and support systems, Assess for adequacy in community support network, Educate family and significant other(s) on suicide prevention, Complete Psychosocial Assessment, Interpersonal group therapy.  Evaluation of Outcomes: Not Progressing   Progress in Treatment: Attending groups: attended some groups Participating in groups:  Yes. Taking medication as prescribed: Yes. Toleration medication: Yes. Family/Significant other contact made: consents are pending Patient understands diagnosis: Yes. Discussing patient identified problems/goals with staff: Yes. Medical problems stabilized or resolved: Yes. Denies suicidal/homicidal ideation: Yes. Issues/concerns per patient self-inventory: No.  New problem(s) identified:  No  New Short Term/Long Term Goal(s):   detox, medication management for mood stabilization; elimination of SI thoughts; development of comprehensive mental wellness/sobriety plan  Patient Goals:  I want to start getting sober again.  I'm ready to go to inpatient treatment for alcohol.  Discharge Plan or Barriers:  Patient recently admitted. CSW will continue to follow and assess for appropriate  referrals and possible discharge planning.   Reason for Continuation of Hospitalization: Anxiety Depression Medication stabilization  Estimated Length of Stay:  5 - 7 days  Last 3 [place] Suicide Severity Risk Score: Flowsheet Row [facility] (Current[facility] to Hosp-Admission (Discharged) from 01/20/2023 in [facility]  C-SSRS RISK CATEGORY No Risk No Risk No Risk       Last PHQ 2/9 Scores:    01/20/2023    3:54 PM 10/19/2022    8:43 AM 10/18/2022    2:32 PM  Depression screen PHQ 2/9  Decreased Interest 1 1 1   Down, Depressed, Hopeless 1 1 1   PHQ - 2 Score 2 2 2   Altered sleeping  1 1  Tired, decreased energy  0 1  Change in appetite  0 0  Feeling bad or failure about yourself   0 0  Trouble concentrating  1 1  Moving slowly or fidgety/restless  1 0  Suicidal thoughts  0 0  PHQ-9 Score  5 5  Difficult doing work/chores  Very difficult Very difficult    Scribe for Treatment Team: Kesleigh Morson O Alexzandria Massman, LCSWA 06/22/2023 5:44 PM

## 2023-06-22 NOTE — Group Note (Signed)
 Recreation Therapy Group Note   Group Topic:Team Building  Group Date: 06/22/2023 Start Time: 0945 End Time: 1005 Facilitators: Cathan Gearin-McCall, LRT,CTRS Location: [address] Dayroom   Group Topic: Communication, Team Building, Problem Solving  Goal Area(s) Addresses:  Patient will effectively work with peer towards shared goal.  Patient will identify skills used to make activity successful.  Patient will identify how skills used during activity can be used to reach post d/c goals.   Behavioral Response:   Intervention: STEM Activity  Activity: Landing Pad. In teams of 3-5, patients were given 12 plastic drinking straws and an equal length of masking tape. Using the materials provided, patients were asked to build a landing pad to catch a golf ball dropped from approximately 5 feet in the air. All materials were required to be used by the team in their design. LRT facilitated post-activity discussion.  Education: Pharmacist, community, Scientist, physiological, Discharge Planning   Education Outcome: Acknowledges education/In group clarification offered/Needs additional education.    Affect/Mood: N/A   Participation Level: Did not attend    Clinical Observations/Individualized Feedback:     Plan: Continue to engage patient in RT group sessions 2-3x/week.   Shahana Capes-McCall, LRT,CTRS 06/22/2023 1:01 PM

## 2023-06-22 NOTE — BHH Group Notes (Signed)
 Spirituality Group   Focus of discussion: Gratitude and Strength Awareness   Process: Following theoretical framework of group therapy of Irvin Yalom and further informed by Rogerian and Relational Cultural Theory approaches, participants invited to name:   Sources of gratitude (internal>external)   Articulate gratitude for self   Name a personal strength/gift/skill   Locate points of resonance among group members/engage the here and now   Conclude with grounding/breathwork       Observations: Colen was reserved but seemed to be passively engaged in the group discussion.  Malaki Koury L. Minetta Aly, M.Div [phone]

## 2023-06-22 NOTE — Plan of Care (Signed)
  Problem: Education: Goal: Knowledge of [facility] Education information/materials will improve Outcome: Progressing Goal: Emotional status will improve Outcome: Progressing Goal: Mental status will improve Outcome: Progressing   Problem: Activity: Goal: Interest or engagement in activities will improve Outcome: Progressing   

## 2023-06-22 NOTE — BHH Group Notes (Signed)
 BHH Group Notes:  (Nursing/MHT/Case Management/Adjunct)  Date:  06/22/2023  Time:  8:24 PM  Type of Therapy:  NA Group  Participation Level:  Did Not Attend  Participation Quality:    Affect:    Cognitive:    Insight:    Engagement in Group:    Modes of Intervention:    Summary of Progress/Problems: Didn't attend  Danny West 06/22/2023, 8:24 PM

## 2023-06-22 NOTE — Progress Notes (Signed)
   06/22/23 1000  Psych Admission Type (Psych Patients Only)  Admission Status Voluntary  Psychosocial Assessment  Patient Complaints Depression  Eye Contact Fair  Facial Expression Animated  Affect Appropriate to circumstance  Speech Logical/coherent  Interaction Assertive  Motor Activity Other (Comment) (WNL)  Appearance/Hygiene Unremarkable  Behavior Characteristics Cooperative  Mood Pleasant;Euthymic  Thought Process  Coherency WDL  Content WDL  Delusions None reported or observed  Perception WDL  Hallucination None reported or observed  Judgment Impaired  Confusion None  Danger to Self  Current suicidal ideation? Denies  Description of Suicide Plan none  Agreement Not to Harm Self Yes  Description of Agreement verbal  Danger to Others  Danger to Others None reported or observed

## 2023-06-22 NOTE — Plan of Care (Signed)
   Problem: Education: Goal: Knowledge of [facility] Education information/materials will improve Outcome: Progressing Goal: Emotional status will improve Outcome: Progressing Goal: Mental status will improve Outcome: Progressing Goal: Verbalization of understanding the information provided will improve Outcome: Progressing

## 2023-06-22 NOTE — Progress Notes (Signed)
 [facility] MD Progress Note  06/22/2023 3:50 PM Danny West  MRN:  [id number]  Reason for admission: 48 y.o. male with a reported mental health history MDD, GAD, alcohol use disorder, who presented to the [facility] today with complaints of worsening depressive symptoms due to recently relapsing on alcohol related to several psychosocial stressors.   Daily notes: Danny West is seen this morning. He presents alert, oriented & aware of situation. He is visible on the unit, attending group sessions. He presents with an improving affect & good eye contact. He reports, I had to come here for help. I got a DUI. When I got this UDI this time, I told myself to get it together.  But I needed help doing that.  Right now I have no depression or anxiety.  I slept well last night.  I am eating okay.  I have no side effects from my medicines. I learnt that my liver is not good anymore because of my drinking.  I need to get into a program that would work for me at this time.  I am also trying to figure out what type of program is best for me to do.  I just need to call my family and see where they are on that because fellowship [place] treatment program for somebody like me is about 28,000.  It is very expensive, but a good program.  I do not [facility]. I just came out of the day room to rest a little bit in my room. Danny West currently denies any SIHI, AVH, delusional thoughts or paranoia. He does not appear to be responding to any internal stimuli. Reviewed current lab results. Patient's T4 is <0.25. Patient is on Levothyroxine  200 mcg daily.  This case was discussed with the treatment team. There are no changes made on patient's treatment plan. Continue as already in progress.  Principal Problem: MDD (major depressive disorder), recurrent severe, without psychosis (HCC)  Diagnosis: Principal Problem:   MDD (major depressive disorder), recurrent severe,  without psychosis (HCC) Active Problems:   Acquired hypothyroidism   Alcohol use disorder, severe, dependence (HCC)   HLD (hyperlipidemia)   GAD (generalized anxiety disorder)   Asthma   Tobacco use disorder  Total Time spent with patient: 45 minutes  Past Psychiatric History: See H&P.  Past Medical History:  Past Medical History:  Diagnosis Date   Alcohol use disorder, severe, dependence (HCC) 09/13/2021   Alcoholic hepatitis    Asthma    H/O suicide attempt 09/14/2021   June 2023 - took handful of zoloft  and naltrexone  while home alone, wrote on whiteboard at home, if you're alive tomorrow, you will live for your kids. Has not mentioned this to anyone Academic librarian included), no psych hospitalization   Hypothyroidism (acquired)     Past Surgical History:  Procedure Laterality Date   NO PAST SURGERIES     Family History: History reviewed. No pertinent family history.  Family Psychiatric  History: See H&P.  Social History:  Social History   Substance and Sexual Activity  Alcohol Use Yes   Comment: drinks (4) 16 oz beers daily     Social History   Substance and Sexual Activity  Drug Use Never    Social History   Socioeconomic History   Marital status: Divorced    Spouse name: Not on file   Number of children: 2   Years of education: 12   Highest education level: Not on file  Occupational History   Not on file  Tobacco Use   Smoking status: Never   Smokeless tobacco: Current    Types: Snuff  Vaping Use   Vaping status: Every Day   Substances: THC  Substance and Sexual Activity   Alcohol use: Yes    Comment: drinks (4) 16 oz beers daily   Drug use: Never   Sexual activity: Not Currently  Other Topics Concern   Not on file  Social History Narrative   Pt lives alone and works at [employer]. Divorced 11/2019; twin boys are 50 years old   Social Drivers of Corporate investment banker Strain: Not on file  Food Insecurity: No Food Insecurity (06/21/2023)    Hunger Vital Sign    Worried About Running Out of Food in the Last Year: Never true    Ran Out of Food in the Last Year: Never true  Transportation Needs: No Transportation Needs (06/21/2023)   PRAPARE - Administrator, Civil Service (Medical): No    Lack of Transportation (Non-Medical): No  Physical Activity: Not on file  Stress: Not on file  Social Connections: Unknown (05/25/2021)   [facility]   Social Network    Social Network: Not on file   Additional Social History:   Sleep: Good  Appetite:  Good  Current Medications: Current Facility-Administered Medications  Medication Dose Route Frequency Provider Last Rate Last Admin   acetaminophen  (TYLENOL ) tablet 650 mg  650 mg Oral Q6H PRN Nkwenti, Arzella Laurence, NP       albuterol  (VENTOLIN  HFA) 108 (90 Base) MCG/ACT inhaler 1 puff  1 puff Inhalation Q6H PRN Dorthea Gauze, NP       alum & mag hydroxide-simeth (MAALOX/MYLANTA) 200-200-20 MG/5ML suspension 30 mL  30 mL Oral Q4H PRN Nkwenti, Doris, NP       cyanocobalamin  (VITAMIN B12) tablet 1,000 mcg  1,000 mcg Oral Daily Zouev, Dmitri, MD   1,000 mcg at 06/22/23 0829   haloperidol lactate (HALDOL) injection 10 mg  10 mg Intramuscular TID PRN Robet Chiquito, NP       And   diphenhydrAMINE (BENADRYL) injection 50 mg  50 mg Intramuscular TID PRN Robet Chiquito, NP       And   LORazepam  (ATIVAN ) injection 2 mg  2 mg Intramuscular TID PRN Robet Chiquito, NP       haloperidol lactate (HALDOL) injection 5 mg  5 mg Intramuscular TID PRN Robet Chiquito, NP       And   diphenhydrAMINE (BENADRYL) injection 50 mg  50 mg Intramuscular TID PRN Robet Chiquito, NP       And   LORazepam  (ATIVAN ) injection 2 mg  2 mg Intramuscular TID PRN Robet Chiquito, NP       hydrOXYzine  (ATARAX ) tablet 25 mg  25 mg Oral TID PRN Robet Chiquito, NP       levothyroxine  (SYNTHROID ) tablet 200 mcg  200 mcg Oral Q0600 Dorthea Gauze, NP   200 mcg at 06/22/23 5638   loperamide   (IMODIUM ) capsule 2-4 mg  2-4 mg Oral PRN Nkwenti, Doris, NP       LORazepam  (ATIVAN ) tablet 1 mg  1 mg Oral Q6H PRN Robet Chiquito, NP       LORazepam  (ATIVAN ) tablet 1 mg  1 mg Oral TID Nkwenti, Doris, NP   1 mg at 06/22/23 1144   Followed by   Cecily Cohen ON 06/23/2023] LORazepam  (ATIVAN ) tablet 1 mg  1 mg Oral BID Robet Chiquito, NP  Followed by   Cecily Cohen ON 06/24/2023] LORazepam  (ATIVAN ) tablet 1 mg  1 mg Oral Daily Nkwenti, Doris, NP       magnesium  hydroxide (MILK OF MAGNESIA) suspension 30 mL  30 mL Oral Daily PRN Robet Chiquito, NP       multivitamin with minerals tablet 1 tablet  1 tablet Oral Daily Robet Chiquito, NP   1 tablet at 06/22/23 1610   nicotine  (NICODERM CQ  - dosed in mg/24 hours) patch 14 mg  14 mg Transdermal Daily Dorthea Gauze, NP   14 mg at 06/22/23 0829   OLANZapine zydis (ZYPREXA) disintegrating tablet 5 mg  5 mg Oral TID PRN Robet Chiquito, NP       ondansetron  (ZOFRAN -ODT) disintegrating tablet 4 mg  4 mg Oral Q6H PRN Robet Chiquito, NP       QUEtiapine  (SEROQUEL ) tablet 50 mg  50 mg Oral QHS PRN Onuoha, Chinwendu V, NP   50 mg at 06/21/23 2245   sertraline  (ZOLOFT ) tablet 100 mg  100 mg Oral Daily Zouev, Dmitri, MD   100 mg at 06/22/23 0829   thiamine  (Vitamin B-1) tablet 100 mg  100 mg Oral Daily Robet Chiquito, NP   100 mg at 06/22/23 9604   Lab Results:  Results for orders placed or performed during the hospital encounter of 06/21/23 (from the past 48 hours)  Basic metabolic panel     Status: Abnormal   Collection Time: 06/21/23  6:33 AM  Result Value Ref Range   Sodium 138 135 - 145 mmol/L   Potassium 3.8 3.5 - 5.1 mmol/L   Chloride 102 98 - 111 mmol/L   CO2 26 22 - 32 mmol/L   Glucose, Bld 96 70 - 99 mg/dL    Comment: Glucose reference range applies only to samples taken after fasting for at least 8 hours.   BUN 10 6 - 20 mg/dL   Creatinine, Ser 5.40 0.61 - 1.24 mg/dL   Calcium 8.7 (L) 8.9 - 10.3 mg/dL   GFR, Estimated >98 >11 mL/min    Comment:  (NOTE) Calculated using the CKD-EPI Creatinine Equation (2021)    Anion gap 10 5 - 15    Comment: Performed at [facility], [address], [place], [place] [address]  Lipid panel     Status: Abnormal   Collection Time: 06/21/23  6:33 AM  Result Value Ref Range   Cholesterol 238 (H) 0 - 200 mg/dL    Comment:        ATP III CLASSIFICATION:  <200     mg/dL   Desirable  295-621  mg/dL   Borderline High  >=308    mg/dL   High           Triglycerides 966 (H) <150 mg/dL    Comment: RESULTS CONFIRMED BY MANUAL DILUTION   HDL 48 >40 mg/dL   Total CHOL/HDL Ratio 5.0 RATIO   VLDL UNABLE TO CALCULATE IF TRIGLYCERIDE OVER 400 mg/dL 0 - 40 mg/dL   LDL Cholesterol UNABLE TO CALCULATE IF TRIGLYCERIDE OVER 400 mg/dL 0 - 99 mg/dL    Comment:        Total Cholesterol/HDL:CHD Risk Coronary Heart Disease Risk Table                     Men   Women  1/2 Average Risk   3.4   3.3  Average Risk       5.0   4.4  2 X Average Risk  9.6   7.1  3 X Average Risk  23.4   11.0        Use the calculated Patient Ratio above and the CHD Risk Table to determine the patient's CHD Risk.        ATP III CLASSIFICATION (LDL):  <100     mg/dL   Optimal  500-938  mg/dL   Near or Above                    Optimal  130-159  mg/dL   Borderline  182-993  mg/dL   High  >716     mg/dL   Very High Performed at [facility], [address] [place], [place] [address]   T4, free     Status: Abnormal   Collection Time: 06/22/23  6:35 AM  Result Value Ref Range   Free T4 <0.25 (L) 0.61 - 1.12 ng/dL    Comment: (NOTE) Biotin ingestion may interfere with free T4 tests. If the results are inconsistent with the TSH level, previous test results, or the clinical presentation, then consider biotin interference. If needed, order repeat testing after stopping biotin. Performed at [facility], [address], [place], [place] [address]   LDL cholesterol, direct     Status:  Abnormal   Collection Time: 06/22/23  6:35 AM  Result Value Ref Range   Direct LDL 122 (H) 0 - 99 mg/dL    Comment: Performed at [facility], [address], [place], [place] [address]   Blood Alcohol level:  Lab Results  Component Value Date   ETH 285 (H) 06/20/2023   ETH 88 (H) 01/20/2023   Metabolic Disorder Labs: Lab Results  Component Value Date   HGBA1C 5.4 06/20/2023   MPG 108.28 06/20/2023   MPG 134.11 01/20/2023   Lab Results  Component Value Date   PROLACTIN 10.0 03/09/2022   Lab Results  Component Value Date   CHOL 238 (H) 06/21/2023   TRIG 966 (H) 06/21/2023   HDL 48 06/21/2023   CHOLHDL 5.0 06/21/2023   VLDL UNABLE TO CALCULATE IF TRIGLYCERIDE OVER 400 mg/dL 58/52/7782   LDLCALC UNABLE TO CALCULATE IF TRIGLYCERIDE OVER 400 mg/dL 42/35/3614   LDLCALC UNABLE TO CALCULATE IF TRIGLYCERIDE OVER 400 mg/dL 43/15/4008   Physical Findings: AIMS:  , ,  ,  ,    CIWA:  CIWA-Ar Total: 1 COWS:     Musculoskeletal: Strength & Muscle Tone: within normal limits Gait & Station: normal Patient leans: N/A  Psychiatric Specialty Exam:  Presentation  General Appearance:  Appropriate for Environment; Casual; Fairly Groomed  Eye Contact: Good  Speech: Clear and Coherent; Normal Rate  Speech Volume: Normal  Handedness: Right   Mood and Affect  Mood: -- (Patient says he is hopeful.)  Affect: Congruent   Thought Process  Thought Processes: Coherent; Goal Directed; Linear  Descriptions of Associations:Intact  Orientation:Full (Time, Place and Person)  Thought Content:Logical  History of Schizophrenia/Schizoaffective disorder:No  Duration of Psychotic Symptoms: NA Hallucinations:Hallucinations: None  Ideas of Reference:None  Suicidal Thoughts:Suicidal Thoughts: No  Homicidal Thoughts:Homicidal Thoughts: No   Sensorium  Memory: Immediate Good; Recent Good; Remote Good  Judgment: Good  Insight: Good   Executive Functions   Concentration: Good  Attention Span: Good  Recall: Good  Fund of Knowledge: Fair  Language: Fair   Psychomotor Activity  Psychomotor Activity: Psychomotor Activity: Normal  Assets  Assets: Communication Skills; Desire for Improvement; Housing; Physical Health; Social Support  Sleep  Sleep: Sleep: Good Number of Hours of Sleep: 6.5  Physical Exam: Physical Exam Vitals and nursing note reviewed.  HENT:     Head: Normocephalic.     Nose: Nose normal.     Mouth/Throat:     Pharynx: Oropharynx is clear.  Eyes:     Pupils: Pupils are equal, round, and reactive to light.  Cardiovascular:     Pulses: Normal pulses.  Pulmonary:     Effort: Pulmonary effort is normal.  Genitourinary:    Comments: Deferred Musculoskeletal:        General: Normal range of motion.     Cervical back: Normal range of motion.  Skin:    General: Skin is dry.  Neurological:     General: No focal deficit present.     Mental Status: He is alert and oriented to person, place, and time.    Review of Systems  Constitutional:  Negative for chills and fever.  HENT:  Negative for congestion and sore throat.   Eyes:  Negative for blurred vision.  Respiratory:  Negative for cough, shortness of breath and wheezing.   Cardiovascular:  Negative for chest pain and palpitations.  Gastrointestinal:  Negative for abdominal pain, blood in stool, constipation, diarrhea, heartburn, melena, nausea and vomiting.  Genitourinary:  Negative for dysuria.  Musculoskeletal:  Negative for joint pain and myalgias.  Skin:  Negative for rash.  Neurological:  Negative for dizziness, tingling, tremors, sensory change, speech change (Hx. alcoholism, chronic.), focal weakness, seizures, loss of consciousness, weakness and headaches.  Endo/Heme/Allergies:        NKDA  Psychiatric/Behavioral:  Positive for substance abuse (Hx. alcoholism, chronic.). Negative for depression, hallucinations, memory loss and suicidal  ideas. The patient is not nervous/anxious and does not have insomnia.    Blood pressure (!) 132/97, pulse 79, temperature 98 F (36.7 C), temperature source Oral, resp. rate 18, height 5' 7 (1.702 m), weight 106.7 kg, SpO2 96%. Body mass index is 36.84 kg/m.  Treatment Plan Summary: Daily contact with patient to assess and evaluate symptoms and progress in treatment and Medication management.   Principal/active diagnoses.  Alcohol use disorder. MDD (major depressive disorder), recurrent severe, without psychosis (HCC). Hx. GAD.   Other medical issues.  Acquired hypothyroidism.   Plan: The risks/benefits/side-effects/alternatives to the medications in use were discussed in detail with the patient and time was given for patient's questions. The patient consents to medication trial.   -Continue Sertraline  100 mg po daily for depression.  -Continue Seroquel  50 mg po Q bedtime for mood stability.  -Continue Nicotine  patch 14 mg topically Q 24 hours prn for nicotine  withdrawal management.  -Continue the CIWA detox protocols for ETOH withdrawal management.  -Continue Hydroxyzine  25 mg po tid prn for anxiety.  -Continue the agitation protocols as recommended (See MAR).   Other medical issues.  -Continue Albuterol  inhaler 1 puff Q 6 hrs prn for SOB. -Continue Synthroid  200 mcg po Q am prior to breakfast for hypothyroidism.  -Continue Vitamin B12 1,000 mcg po daily for B12 replacement.  Other PRNS -Continue Tylenol  650 mg every 6 hours PRN for mild pain -Continue Maalox 30 ml Q 4 hrs PRN for indigestion -Continue MOM 30 ml po Q 6 hrs for constipation  Safety and Monitoring: Voluntary admission to inpatient psychiatric unit for safety, stabilization and treatment Daily contact with patient to assess and evaluate symptoms and progress in treatment Patient's case to be discussed in multi-disciplinary team meeting Observation Level : q15 minute checks Vital  signs: q12 hours Precautions:  Safety  Discharge Planning: Social work and case management to assist with discharge planning and identification of hospital follow-up needs prior to discharge Estimated LOS: 5-7 days Discharge Concerns: Need to establish a safety plan; Medication compliance and effectiveness Discharge Goals: Return home with outpatient referrals for mental health follow-up including medication management/psychotherapy  Asuncion Layer, NP, pmhnp, fnp-bc. 06/22/2023, 3:50 PM

## 2023-06-22 NOTE — Progress Notes (Signed)
   06/22/23 0200  Psych Admission Type (Psych Patients Only)  Admission Status Voluntary  Psychosocial Assessment  Patient Complaints Anxiety;Depression  Eye Contact Fair  Facial Expression Anxious  Affect Appropriate to circumstance  Speech Logical/coherent  Interaction Assertive  Motor Activity Other (Comment) (WNL)  Appearance/Hygiene Unremarkable  Behavior Characteristics Cooperative  Mood Euthymic  Thought Process  Coherency WDL  Content WDL  Delusions None reported or observed  Perception WDL  Hallucination None reported or observed  Judgment Impaired  Confusion None  Danger to Self  Current suicidal ideation? Denies  Description of Suicide Plan None  Agreement Not to Harm Self Yes  Description of Agreement Verbal  Danger to Others  Danger to Others None reported or observed
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
## 2023-06-22 NOTE — Plan of Care (Signed)
  Problem: Education: Goal: Emotional status will improve Outcome: Progressing Goal: Verbalization of understanding the information provided will improve Outcome: Progressing   Problem: Coping: Goal: Ability to demonstrate self-control will improve Outcome: Progressing   Problem: Health Behavior/Discharge Planning: Goal: Compliance with treatment plan for underlying cause of condition will improve Outcome: Progressing
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
## 2023-06-22 NOTE — Progress Notes (Signed)
   06/22/23 2300  Psych Admission Type (Psych Patients Only)  Admission Status Voluntary  Psychosocial Assessment  Patient Complaints Depression;Anxiety  Eye Contact Fair  Facial Expression Anxious  Affect Appropriate to circumstance  Speech Logical/coherent  Interaction Assertive  Motor Activity Other (Comment) (WNL)  Appearance/Hygiene Unremarkable  Behavior Characteristics Cooperative  Mood Depressed;Pleasant  Thought Process  Coherency WDL  Content WDL  Delusions None reported or observed  Perception WDL  Hallucination None reported or observed  Judgment Impaired  Confusion None  Danger to Self  Current suicidal ideation? Denies  Description of Suicide Plan None  Agreement Not to Harm Self Yes  Description of Agreement Verbal  Danger to Others  Danger to Others None reported or observed
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
## 2023-06-23 DIAGNOSIS — F332 Major depressive disorder, recurrent severe without psychotic features: Secondary | ICD-10-CM | POA: Diagnosis not present

## 2023-06-23 MED ORDER — NICOTINE POLACRILEX 2 MG MT GUM
2.0000 mg | CHEWING_GUM | OROMUCOSAL | Status: DC | PRN
  Administered 2023-06-23 – 2023-06-25 (×9): 2 mg via ORAL
  Filled 2023-06-23 (×4): qty 1

## 2023-06-23 NOTE — Progress Notes (Signed)
 [facility] MD Progress Note  06/23/2023 11:34 AM Danny West  MRN:  [id number]  Reason for admission: 48 y.o. male with a reported mental health history MDD, GAD, alcohol use disorder, who presented to the [facility] today with complaints of worsening depressive symptoms due to recently relapsing on alcohol related to several psychosocial stressors.   Daily notes: Danny West is seen in his room this morning. Chart reviewed. The chart findings discussed with the treatment team. He was lying down in bed at the time. He presents with a good affect, good eye contact & verbally responsive. He presents with a good affect. He reports, I'm doing okay. I'm just here relaxing. I slept like a baby last night. My mood is improving now that I'm back on my medicines. I'm not having any side effects. My brother in-law is the one who is handling my my substance abuse treatment program plan. He is a very serious guy. I know he will do a good job. My appetite is good. The diarrhea is resolving. I'm attending group sessions. When there are no group sessions going on, I came to my room to relax some.  Danny West currently denies any SIHI, AVH, delusional thoughts or paranoia. He does not appear to be responding to any internal stimuli. Reviewed current lab results.  There are no new lab results. Patient is on Levothyroxine  200 mcg daily. This case was discussed with the treatment team during the team meeting. There are no changes made on patient's treatment plan. Continue as already in progress. His vital signs remain stable.  Principal Problem: MDD (major depressive disorder), recurrent severe, without psychosis (HCC)  Diagnosis: Principal Problem:   MDD (major depressive disorder), recurrent severe, without psychosis (HCC) Active Problems:   Acquired hypothyroidism   Alcohol use disorder, severe, dependence (HCC)   HLD (hyperlipidemia)   GAD (generalized anxiety disorder)   Asthma   Tobacco use  disorder  Total Time spent with patient: 35 minutes.  Past Psychiatric History: See H&P.  Past Medical History:  Past Medical History:  Diagnosis Date   Alcohol use disorder, severe, dependence (HCC) 09/13/2021   Alcoholic hepatitis    Asthma    H/O suicide attempt 09/14/2021   June 2023 - took handful of zoloft  and naltrexone  while home alone, wrote on whiteboard at home, if you're alive tomorrow, you will live for your kids. Has not mentioned this to anyone Academic librarian included), no psych hospitalization   Hypothyroidism (acquired)     Past Surgical History:  Procedure Laterality Date   NO PAST SURGERIES     Family History: History reviewed. No pertinent family history.  Family Psychiatric  History: See H&P.  Social History:  Social History   Substance and Sexual Activity  Alcohol Use Yes   Comment: drinks (4) 16 oz beers daily     Social History   Substance and Sexual Activity  Drug Use Never    Social History   Socioeconomic History   Marital status: Divorced    Spouse name: Not on file   Number of children: 2   Years of education: 12   Highest education level: Not on file  Occupational History   Not on file  Tobacco Use   Smoking status: Never   Smokeless tobacco: Current    Types: Snuff  Vaping Use   Vaping status: Every Day   Substances: THC  Substance and Sexual Activity   Alcohol use: Yes    Comment: drinks (4) 16 oz beers daily  Drug use: Never   Sexual activity: Not Currently  Other Topics Concern   Not on file  Social History Narrative   Pt lives alone and works at [employer]. Divorced 11/2019; twin boys are 50 years old   Social Drivers of Corporate investment banker Strain: Not on file  Food Insecurity: No Food Insecurity (06/21/2023)   Hunger Vital Sign    Worried About Running Out of Food in the Last Year: Never true    Ran Out of Food in the Last Year: Never true  Transportation Needs: No Transportation Needs (06/21/2023)   PRAPARE -  Administrator, Civil Service (Medical): No    Lack of Transportation (Non-Medical): No  Physical Activity: Not on file  Stress: Not on file  Social Connections: Unknown (05/25/2021)   Received [facility]   Social Network    Social Network: Not on file   Additional Social History:   Sleep: Good  Appetite:  Good  Current Medications: Current Facility-Administered Medications  Medication Dose Route Frequency Provider Last Rate Last Admin   acetaminophen  (TYLENOL ) tablet 650 mg  650 mg Oral Q6H PRN Nkwenti, Arzella Laurence, NP       albuterol  (VENTOLIN  HFA) 108 (90 Base) MCG/ACT inhaler 1 puff  1 puff Inhalation Q6H PRN Dorthea Gauze, NP       alum & mag hydroxide-simeth (MAALOX/MYLANTA) 200-200-20 MG/5ML suspension 30 mL  30 mL Oral Q4H PRN Nkwenti, Doris, NP       cyanocobalamin  (VITAMIN B12) tablet 1,000 mcg  1,000 mcg Oral Daily Zouev, Dmitri, MD   1,000 mcg at 06/23/23 0900   haloperidol lactate (HALDOL) injection 10 mg  10 mg Intramuscular TID PRN Robet Chiquito, NP       And   diphenhydrAMINE (BENADRYL) injection 50 mg  50 mg Intramuscular TID PRN Robet Chiquito, NP       And   LORazepam  (ATIVAN ) injection 2 mg  2 mg Intramuscular TID PRN Robet Chiquito, NP       haloperidol lactate (HALDOL) injection 5 mg  5 mg Intramuscular TID PRN Robet Chiquito, NP       And   diphenhydrAMINE (BENADRYL) injection 50 mg  50 mg Intramuscular TID PRN Robet Chiquito, NP       And   LORazepam  (ATIVAN ) injection 2 mg  2 mg Intramuscular TID PRN Robet Chiquito, NP       hydrOXYzine  (ATARAX ) tablet 25 mg  25 mg Oral TID PRN Robet Chiquito, NP       levothyroxine  (SYNTHROID ) tablet 200 mcg  200 mcg Oral Q0600 Dorthea Gauze, NP   200 mcg at 06/23/23 0641   loperamide  (IMODIUM ) capsule 2-4 mg  2-4 mg Oral PRN Nkwenti, Doris, NP       LORazepam  (ATIVAN ) tablet 1 mg  1 mg Oral Q6H PRN Robet Chiquito, NP       LORazepam  (ATIVAN ) tablet 1 mg  1 mg Oral BID Nkwenti, Doris, NP   1 mg at 06/23/23  0900   Followed by   Cecily Cohen ON 06/24/2023] LORazepam  (ATIVAN ) tablet 1 mg  1 mg Oral Daily Nkwenti, Doris, NP       magnesium  hydroxide (MILK OF MAGNESIA) suspension 30 mL  30 mL Oral Daily PRN Robet Chiquito, NP       multivitamin with minerals tablet 1 tablet  1 tablet Oral Daily Nkwenti, Doris, NP   1 tablet at 06/23/23 0900   nicotine  (NICODERM CQ  - dosed in mg/24 hours) patch 14  mg  14 mg Transdermal Daily Dorthea Gauze, NP   14 mg at 06/23/23 0900   OLANZapine zydis (ZYPREXA) disintegrating tablet 5 mg  5 mg Oral TID PRN Robet Chiquito, NP       ondansetron  (ZOFRAN -ODT) disintegrating tablet 4 mg  4 mg Oral Q6H PRN Nkwenti, Doris, NP       QUEtiapine  (SEROQUEL ) tablet 50 mg  50 mg Oral QHS PRN Onuoha, Chinwendu V, NP   50 mg at 06/22/23 2106   sertraline  (ZOLOFT ) tablet 100 mg  100 mg Oral Daily Zouev, Dmitri, MD   100 mg at 06/23/23 0900   thiamine  (Vitamin B-1) tablet 100 mg  100 mg Oral Daily Robet Chiquito, NP   100 mg at 06/23/23 0900   Lab Results:  Results for orders placed or performed during the hospital encounter of 06/21/23 (from the past 48 hours)  T4, free     Status: Abnormal   Collection Time: 06/22/23  6:35 AM  Result Value Ref Range   Free T4 <0.25 (L) 0.61 - 1.12 ng/dL    Comment: (NOTE) Biotin ingestion may interfere with free T4 tests. If the results are inconsistent with the TSH level, previous test results, or the clinical presentation, then consider biotin interference. If needed, order repeat testing after stopping biotin. Performed at [facility], [address], [place], [place] [address]   LDL cholesterol, direct     Status: Abnormal   Collection Time: 06/22/23  6:35 AM  Result Value Ref Range   Direct LDL 122 (H) 0 - 99 mg/dL    Comment: Performed at [facility], [address] [place], [place] [address]   Blood Alcohol level:  Lab Results  Component Value Date   ETH 285 (H) 06/20/2023   ETH 88 (H) 01/20/2023   Metabolic Disorder  Labs: Lab Results  Component Value Date   HGBA1C 5.4 06/20/2023   MPG 108.28 06/20/2023   MPG 134.11 01/20/2023   Lab Results  Component Value Date   PROLACTIN 10.0 03/09/2022   Lab Results  Component Value Date   CHOL 238 (H) 06/21/2023   TRIG 966 (H) 06/21/2023   HDL 48 06/21/2023   CHOLHDL 5.0 06/21/2023   VLDL UNABLE TO CALCULATE IF TRIGLYCERIDE OVER 400 mg/dL 09/81/1914   LDLCALC UNABLE TO CALCULATE IF TRIGLYCERIDE OVER 400 mg/dL 78/29/5621   LDLCALC UNABLE TO CALCULATE IF TRIGLYCERIDE OVER 400 mg/dL 30/86/5784   Physical Findings: AIMS:  , ,  ,  ,    CIWA:  CIWA-Ar Total: 1 COWS:     Musculoskeletal: Strength & Muscle Tone: within normal limits Gait & Station: normal Patient leans: N/A  Psychiatric Specialty Exam:  Presentation  General Appearance:  Appropriate for Environment; Casual; Fairly Groomed  Eye Contact: Good  Speech: Clear and Coherent; Normal Rate  Speech Volume: Normal  Handedness: Right   Mood and Affect  Mood: -- (Patient says he is hopeful.)  Affect: Congruent   Thought Process  Thought Processes: Coherent; Goal Directed; Linear  Descriptions of Associations:Intact  Orientation:Full (Time, Place and Person)  Thought Content:Logical  History of Schizophrenia/Schizoaffective disorder:No  Duration of Psychotic Symptoms: NA  Hallucinations: NA.   Ideas of Reference:None  Suicidal Thoughts:Suicidal Thoughts: No  Homicidal Thoughts:Homicidal Thoughts: No   Sensorium  Memory: Immediate Good; Recent Good; Remote Good  Judgment: Good  Insight: Good   Executive Functions  Concentration: Good  Attention Span: Good  Recall: Good  Fund of Knowledge: Fair  Language: Fair  Psychomotor Activity  Psychomotor Activity: Psychomotor Activity: Normal  Assets  Assets: Communication Skills; Desire for Improvement; Housing; Physical Health; Social Support  Sleep  Sleep: Sleep: Good Number of Hours  of Sleep: 6.5  Physical Exam: Physical Exam Vitals and nursing note reviewed.  HENT:     Head: Normocephalic.     Nose: Nose normal.     Mouth/Throat:     Pharynx: Oropharynx is clear.   Eyes:     Pupils: Pupils are equal, round, and reactive to light.    Cardiovascular:     Pulses: Normal pulses.  Pulmonary:     Effort: Pulmonary effort is normal.  Genitourinary:    Comments: Deferred  Musculoskeletal:        General: Normal range of motion.     Cervical back: Normal range of motion.   Skin:    General: Skin is dry.   Neurological:     General: No focal deficit present.     Mental Status: He is alert and oriented to person, place, and time.    Review of Systems  Constitutional:  Negative for chills and fever.  HENT:  Negative for congestion and sore throat.   Eyes:  Negative for blurred vision.  Respiratory:  Negative for cough, shortness of breath and wheezing.   Cardiovascular:  Negative for chest pain and palpitations.  Gastrointestinal:  Negative for abdominal pain, blood in stool, constipation, diarrhea, heartburn, melena, nausea and vomiting.  Genitourinary:  Negative for dysuria.  Musculoskeletal:  Negative for joint pain and myalgias.  Skin:  Negative for rash.  Neurological:  Negative for dizziness, tingling, tremors, sensory change, speech change (Hx. alcoholism, chronic.), focal weakness, seizures, loss of consciousness, weakness and headaches.  Endo/Heme/Allergies:        NKDA  Psychiatric/Behavioral:  Positive for substance abuse (Hx. alcoholism, chronic.). Negative for depression, hallucinations, memory loss and suicidal ideas. The patient is not nervous/anxious and does not have insomnia.    Blood pressure 123/83, pulse 85, temperature 97.7 F (36.5 C), temperature source Oral, resp. rate 14, height 5' 7 (1.702 m), weight 106.7 kg, SpO2 99%. Body mass index is 36.84 kg/m.  Treatment Plan Summary: Daily contact with patient to assess and evaluate  symptoms and progress in treatment and Medication management.   Principal/active diagnoses.  Alcohol use disorder. MDD (major depressive disorder), recurrent severe, without psychosis (HCC). Hx. GAD.   Other medical issues.  Acquired hypothyroidism.   Plan: The risks/benefits/side-effects/alternatives to the medications in use were discussed in detail with the patient and time was given for patient's questions. The patient consents to medication trial.   -Continue Sertraline  100 mg po daily for depression.  -Continue Seroquel  50 mg po Q bedtime for mood stability.  -Continue Nicotine  patch 14 mg topically Q 24 hours prn for nicotine  withdrawal management.  -Continue the CIWA detox protocols for ETOH withdrawal management.  -Continue Hydroxyzine  25 mg po tid prn for anxiety.  -Continue the agitation protocols as recommended (See MAR).   Other medical issues.  -Continue Albuterol  inhaler 1 puff Q 6 hrs prn for SOB. -Continue Synthroid  200 mcg po Q am prior to breakfast for hypothyroidism.  -Continue Vitamin B12 1,000 mcg po daily for B12 replacement.  Other PRNS -Continue Tylenol  650 mg every 6 hours PRN for mild pain -Continue Maalox 30 ml Q 4 hrs PRN for indigestion -Continue MOM 30 ml po Q 6 hrs for constipation  Safety and Monitoring: Voluntary admission to inpatient psychiatric unit for safety, stabilization and treatment  Daily contact with patient to assess and evaluate symptoms and progress in treatment Patient's case to be discussed in multi-disciplinary team meeting Observation Level : q15 minute checks Vital signs: q12 hours Precautions: Safety  Discharge Planning: Social work and case management to assist with discharge planning and identification of hospital follow-up needs prior to discharge Estimated LOS: 5-7 days Discharge Concerns: Need to establish a safety plan; Medication compliance and effectiveness Discharge Goals: Return home with outpatient referrals for  mental health follow-up including medication management/psychotherapy  Asuncion Layer, NP, pmhnp, fnp-bc. 06/23/2023, 11:34 AM Patient ID: Beatris Bough, male   DOB: [birth year], 48 y.o.   MRN: [id number]

## 2023-06-23 NOTE — [facility] Group Notes (Signed)
 [facility] Group Notes:  (Nursing/MHT/Case Management/Adjunct)  Date:  06/23/2023  Time:  9:05 PM  Type of Therapy:  Wrap-up group  Participation Level:  Active  Participation Quality:  Appropriate  Affect:  Appropriate  Cognitive:  Appropriate  Insight:  Appropriate  Engagement in Group:  Engaged  Modes of Intervention:  Education  Summary of Progress/Problems: Goal to rest. Rated day 7/10.  Danny West 06/23/2023, 9:05 PM

## 2023-06-23 NOTE — Group Note (Signed)
 LCSW Group Therapy Note   Group Date: 06/23/2023 Start Time: 1100 End Time: 1200   Participation:  did not attend  Type of Therapy:  Group Therapy   Topic:  Stronger Together:  Building Healthy Relationships  Objective:  To explore loneliness, boundaries, and safe ways to build relationships.  Goals: Recognize healthy vs. unhealthy relationships. Learn safe ways to connect with others. Strengthen communication and [employer].  Summary:  Participants discussed loneliness, healthy connections, and setting boundaries. They explored safe ways to meet people and shared personal experiences. Key insights were reinforced through discussion and quotes.  Therapeutic Modalities Used: Cognitive Behavioral Therapy (CBT) Elements - Identifying unhealthy relationship patterns, challenging negative thoughts about connection. Dialectical Behavior Therapy (DBT) Elements - Interpersonal effectiveness, setting and maintaining boundaries. Supportive Group Therapy - Peer discussion, shared experiences, and emotional validation.   Lamae Fosco O Tyron Manetta, LCSWA 06/23/2023  4:49 PM

## 2023-06-23 NOTE — Group Note (Signed)
 Therapy Group Note  Group Topic:Other  Group Date: 06/23/2023 Start Time: 1400 End Time: 1430 Facilitators: Takila Kronberg G, OT    The primary objective of this topic is to explore and understand the concept of occupational balance in the context of daily living. The term occupational balance is defined broadly, encompassing all activities that occupy an individual's time and energy, including self-care, leisure, and work-related tasks. The goal is to guide participants towards achieving a harmonious blend of these activities, tailored to their personal values and life circumstances. This balance is aimed at enhancing overall well-being, not by equally distributing time across activities, but by ensuring that daily engagements are fulfilling and not draining. The content delves into identifying various barriers that individuals face in achieving occupational balance, such as overcommitment, misaligned priorities, external pressures, and lack of effective time management. The impact of these barriers on occupational performance, roles, and lifestyles is examined, highlighting issues like reduced efficiency, strained relationships, and potential health problems. Strategies for cultivating occupational balance are a key focus. These strategies include practical methods like time blocking, prioritizing tasks, establishing self-care rituals, decluttering, connecting with nature, and engaging in reflective practices. These approaches are designed to be adaptable and applicable to a wide range of life scenarios, promoting a proactive and mindful approach to daily living. The overall aim is to equip participants with the knowledge and tools to create a balanced lifestyle that supports their mental, emotional, and physical health, thereby improving their functional performance in daily life.     Participation Level: Engaged   Participation Quality: Independent   Behavior: Appropriate   Speech/Thought  Process: Relevant   Affect/Mood: Appropriate   Insight: Fair   Judgement: Fair      Modes of Intervention: Education  Patient Response to Interventions:  Attentive   Plan: Continue to engage patient in OT groups 2 - 3x/week.  06/23/2023  Lynnda Sas, OT  Keiva Dina, OT

## 2023-06-23 NOTE — Plan of Care (Signed)
   Problem: Education: Goal: Knowledge of [facility] Education information/materials will improve Outcome: Progressing Goal: Emotional status will improve Outcome: Progressing Goal: Mental status will improve Outcome: Progressing

## 2023-06-23 NOTE — Progress Notes (Signed)
 D:  Patient's self inventory sheet, patient sleeps good, sleep medication helpful.  Fair appetite, low energy level, poor concentration.  Rated depression and anxiety 6, hopeless 2.   Withdrawals, none checked.  Denied SI.  Physical problems, blurred vision.   Physical pain, worst pain #1.  Goal is sleep, eat.  No discharge plans. A:  Medications administered per MD orders.  Emotional support and encouragement given patient. R:  Denied SI and HI, contracts for safety.  Denied A/V hallucinations.  Safety maintained with 15 minute checks.

## 2023-06-23 NOTE — Plan of Care (Signed)
 Nurse discussed anxiety, depression and coping skills with patient.

## 2023-06-24 DIAGNOSIS — F332 Major depressive disorder, recurrent severe without psychotic features: Secondary | ICD-10-CM | POA: Diagnosis not present

## 2023-06-24 MED ORDER — NICOTINE 21 MG/24HR TD PT24: 21.0000 mg | MEDICATED_PATCH | Freq: Every day | TRANSDERMAL | Status: DC

## 2023-06-24 MED ORDER — CEPHALEXIN 500 MG PO CAPS
500.0000 mg | ORAL_CAPSULE | Freq: Three times a day (TID) | ORAL | Status: DC
Start: 2023-06-24 — End: 2023-06-26
  Administered 2023-06-24 – 2023-06-26 (×5): 500 mg via ORAL
  Filled 2023-06-24 (×2): qty 1
  Filled 2023-06-24: qty 32
  Filled 2023-06-24 (×3): qty 1

## 2023-06-24 MED ORDER — SIMETHICONE 80 MG PO CHEW: 80.0000 mg | CHEWABLE_TABLET | Freq: Four times a day (QID) | ORAL | Status: DC | PRN

## 2023-06-24 NOTE — Progress Notes (Signed)
 D:  Patient denied SI and HI, contracts for safety.  Denied A/V hallucinations.  Denied pain. A:  Medications administered per MD orders.  Emotional support and encouragement given patient. R:  Safety maintained with 15 minute checks.

## 2023-06-24 NOTE — Plan of Care (Signed)
 Nurse discussed anxiety with patient.

## 2023-06-24 NOTE — Group Note (Signed)
 Date:  06/24/2023 Time:  10:06 PM  Group Topic/Focus:  Goals Group:   The focus of this group is to help patients establish daily goals to achieve during treatment and discuss how the patient can incorporate goal setting into their daily lives to aide in recovery. Wrap-Up Group:   The focus of this group is to help patients review their daily goal of treatment and discuss progress on daily workbooks.    Participation Level:  Active  Participation Quality:  Sharing  Affect:  Appropriate  Cognitive:  Appropriate  Insight: Good  Engagement in Group:  Engaged  Modes of Intervention:  Discussion  Additional Comments:    Joann Mu 06/24/2023, 10:06 PM

## 2023-06-24 NOTE — Plan of Care (Signed)
   Problem: Education: Goal: Emotional status will improve Outcome: Progressing Goal: Verbalization of understanding the information provided will improve Outcome: Progressing

## 2023-06-24 NOTE — Group Note (Signed)
 Recreation Therapy Group Note   Group Topic:Problem Solving  Group Date: 06/24/2023 Start Time: 1610 End Time: 1000 Facilitators: Gudelia Eugene-McCall, LRT,CTRS Location: [address] Dayroom   Group Topic: Problem Solving  Goal Area(s) Addresses:  Patient will effectively work in a team with other group members. Patient will verbalize importance of using appropriate problem solving techniques.   Behavioral Response:   Intervention: Worksheet  Activity: Dentist. Patients were given two worksheets of brain teasers. Patients got 15 minutes to complete the puzzles. Patients could work with each other if they chose to to figure out what each puzzle was. At the end of the 15 minutes, LRT would go over the answers with the group.     Education: Journalist, newspaper, Communication, Team Building  Education Outcome: Acknowledges understanding/In group clarification offered/Needs additional education.    Affect/Mood: N/A   Participation Level: Did not attend    Clinical Observations/Individualized Feedback:     Plan: Continue to engage patient in RT group sessions 2-3x/week.   Claudell Wohler-McCall, LRT,CTRS 06/24/2023 12:11 PM

## 2023-06-24 NOTE — Progress Notes (Signed)
 CSW called Octavio Ben from [place] [phone] back regarding putting this patient on a waitlist, as there are no beds available at the moment. Called but unable to reach Rhodell. Left a voicemail asking Ludwig Safer to place patient on the waitlist and that I would follow-up with her Monday morning.    Marlenne Ridge, LCSWA

## 2023-06-24 NOTE — Progress Notes (Signed)
 [facility] MD Progress Note  06/24/2023 6:04 PM Danny West  MRN:  [id number]  Reason for admission: 48 y.o. male with a reported mental health history MDD, GAD, alcohol use disorder, who presented to the [facility] today with complaints of worsening depressive symptoms due to recently relapsing on alcohol related to several psychosocial stressors.   Daily notes: Danny West is seen in his room this morning. Chart reviewed. The chart findings discussed with the treatment team. He was sitting up on his bed this morning. He presents with a good affect, good eye contact & verbally responsive. He was arranging his colored drawing pencils. He reports, I'm okay. It is just that my vision is blurry this morning. I feel like I can't get my eyes to focus well. It also feels like my sugar is low. My mood is still good. I will try now to get into the [facility] residential treatment center, instead of spending so much [facility]. I would like to get my phone from the locker room to release some money to pay my bills. I have to show you this too on my left foot. Is this a rash or what? It is itching a little. This provider did examine [name]'s left foot, observed a reddened patch to the front of his left leg. Initiated Keflex  500 mg bid x 7 days. Patient accu check was done, result was 132 post breakfast. Patient denies any shakes, dizziness or increased sweats. The HGBa1c result on admission was 5.4. Patient was informed of the recent result of his hepatitis B surface antigen that was reactive. Patient was a bit surprised to hear this news. He is currently referred to the [facility] department & the [facility] AC/RN informed as well. At this time, patient does not appear to be in any apparent distress. This case was discussed with the treatment team during the team meeting this afternoon. Continue current plan of care as already in progress. His vital signs remain  stable.  Principal Problem: MDD (major depressive disorder), recurrent severe, without psychosis (HCC)  Diagnosis: Principal Problem:   MDD (major depressive disorder), recurrent severe, without psychosis (HCC) Active Problems:   Acquired hypothyroidism   Alcohol use disorder, severe, dependence (HCC)   HLD (hyperlipidemia)   GAD (generalized anxiety disorder)   Asthma   Tobacco use disorder  Total Time spent with patient: 35 minutes.  Past Psychiatric History: See H&P.  Past Medical History:  Past Medical History:  Diagnosis Date   Alcohol use disorder, severe, dependence (HCC) 09/13/2021   Alcoholic hepatitis    Asthma    H/O suicide attempt 09/14/2021   June 2023 - took handful of zoloft  and naltrexone  while home alone, wrote on whiteboard at home, if you're alive tomorrow, you will live for your kids. Has not mentioned this to anyone Academic librarian included), no psych hospitalization   Hypothyroidism (acquired)     Past Surgical History:  Procedure Laterality Date   NO PAST SURGERIES     Family History: History reviewed. No pertinent family history.  Family Psychiatric  History: See H&P.  Social History:  Social History   Substance and Sexual Activity  Alcohol Use Yes   Comment: drinks (4) 16 oz beers daily     Social History   Substance and Sexual Activity  Drug Use Never    Social History   Socioeconomic History   Marital status: Divorced    Spouse name: Not on file   Number  of children: 2   Years of education: 12   Highest education level: Not on file  Occupational History   Not on file  Tobacco Use   Smoking status: Never   Smokeless tobacco: Current    Types: Snuff  Vaping Use   Vaping status: Every Day   Substances: THC  Substance and Sexual Activity   Alcohol use: Yes    Comment: drinks (4) 16 oz beers daily   Drug use: Never   Sexual activity: Not Currently  Other Topics Concern   Not on file  Social History Narrative   Pt lives  alone and works at [employer]. Divorced 11/2019; twin boys are 50 years old   Social Drivers of Corporate investment banker Strain: Not on file  Food Insecurity: No Food Insecurity (06/21/2023)   Hunger Vital Sign    Worried About Running Out of Food in the Last Year: Never true    Ran Out of Food in the Last Year: Never true  Transportation Needs: No Transportation Needs (06/21/2023)   PRAPARE - Administrator, Civil Service (Medical): No    Lack of Transportation (Non-Medical): No  Physical Activity: Not on file  Stress: Not on file  Social Connections: Unknown (05/25/2021)   Received from [facility]   Social Network    Social Network: Not on file   Additional Social History:   Sleep: Good  Appetite:  Good  Current Medications: Current Facility-Administered Medications  Medication Dose Route Frequency Provider Last Rate Last Admin   acetaminophen  (TYLENOL ) tablet 650 mg  650 mg Oral Q6H PRN Nkwenti, Arzella Laurence, NP       albuterol  (VENTOLIN  HFA) 108 (90 Base) MCG/ACT inhaler 1 puff  1 puff Inhalation Q6H PRN Dorthea Gauze, NP       alum & mag hydroxide-simeth (MAALOX/MYLANTA) 200-200-20 MG/5ML suspension 30 mL  30 mL Oral Q4H PRN Nkwenti, Doris, NP       cephALEXin  (KEFLEX ) capsule 500 mg  500 mg Oral Q8H Calhoun Reichardt I, NP       cyanocobalamin  (VITAMIN B12) tablet 1,000 mcg  1,000 mcg Oral Daily Zouev, Dmitri, MD   1,000 mcg at 06/24/23 1610   haloperidol  lactate (HALDOL ) injection 10 mg  10 mg Intramuscular TID PRN Robet Chiquito, NP       And   diphenhydrAMINE  (BENADRYL ) injection 50 mg  50 mg Intramuscular TID PRN Robet Chiquito, NP       And   LORazepam  (ATIVAN ) injection 2 mg  2 mg Intramuscular TID PRN Robet Chiquito, NP       haloperidol  lactate (HALDOL ) injection 5 mg  5 mg Intramuscular TID PRN Robet Chiquito, NP       And   diphenhydrAMINE  (BENADRYL ) injection 50 mg  50 mg Intramuscular TID PRN Robet Chiquito, NP       And   LORazepam  (ATIVAN ) injection 2 mg  2 mg  Intramuscular TID PRN Robet Chiquito, NP       hydrOXYzine  (ATARAX ) tablet 25 mg  25 mg Oral TID PRN Robet Chiquito, NP   25 mg at 06/24/23 1640   levothyroxine  (SYNTHROID ) tablet 200 mcg  200 mcg Oral Q0600 Dorthea Gauze, NP   200 mcg at 06/24/23 9604   magnesium  hydroxide (MILK OF MAGNESIA) suspension 30 mL  30 mL Oral Daily PRN Robet Chiquito, NP       multivitamin with minerals tablet 1 tablet  1 tablet Oral Daily Nkwenti, Doris, NP   1 tablet at  06/24/23 0823   nicotine  polacrilex (NICORETTE ) gum 2 mg  2 mg Oral PRN Zouev, Dmitri, MD   2 mg at 06/24/23 1639   OLANZapine  zydis (ZYPREXA ) disintegrating tablet 5 mg  5 mg Oral TID PRN Robet Chiquito, NP       QUEtiapine  (SEROQUEL ) tablet 50 mg  50 mg Oral QHS PRN Onuoha, Chinwendu V, NP   50 mg at 06/23/23 2045   sertraline  (ZOLOFT ) tablet 100 mg  100 mg Oral Daily Zouev, Dmitri, MD   100 mg at 06/24/23 1610   simethicone (MYLICON) chewable tablet 80 mg  80 mg Oral QID PRN Asuncion Layer I, NP       thiamine  (Vitamin B-1) tablet 100 mg  100 mg Oral Daily Nkwenti, Doris, NP   100 mg at 06/24/23 9604   Lab Results:  No results found for this or any previous visit (from the past 48 hours).  Blood Alcohol level:  Lab Results  Component Value Date   ETH 285 (H) 06/20/2023   ETH 88 (H) 01/20/2023   Metabolic Disorder Labs: Lab Results  Component Value Date   HGBA1C 5.4 06/20/2023   MPG 108.28 06/20/2023   MPG 134.11 01/20/2023   Lab Results  Component Value Date   PROLACTIN 10.0 03/09/2022   Lab Results  Component Value Date   CHOL 238 (H) 06/21/2023   TRIG 966 (H) 06/21/2023   HDL 48 06/21/2023   CHOLHDL 5.0 06/21/2023   VLDL UNABLE TO CALCULATE IF TRIGLYCERIDE OVER 400 mg/dL 54/09/8117   LDLCALC UNABLE TO CALCULATE IF TRIGLYCERIDE OVER 400 mg/dL 14/78/2956   LDLCALC UNABLE TO CALCULATE IF TRIGLYCERIDE OVER 400 mg/dL 21/30/8657   Physical Findings: AIMS:  , ,  ,  ,    CIWA:  CIWA-Ar Total: 1 COWS:      Musculoskeletal: Strength & Muscle Tone: within normal limits Gait & Station: normal Patient leans: N/A  Psychiatric Specialty Exam:  Presentation  General Appearance:  Casual; Fairly Groomed  Eye Contact: Good  Speech: Clear and Coherent; Normal Rate  Speech Volume: Normal  Handedness: Right   Mood and Affect  Mood: -- (Hopeful.)  Affect: Congruent   Thought Process  Thought Processes: Coherent; Goal Directed; Linear  Descriptions of Associations:Intact  Orientation:Full (Time, Place and Person)  Thought Content:Logical  History of Schizophrenia/Schizoaffective disorder:No  Duration of Psychotic Symptoms: NA  Hallucinations: NA.   Ideas of Reference:None  Suicidal Thoughts:Suicidal Thoughts: No   Homicidal Thoughts:Homicidal Thoughts: No    Sensorium  Memory: Immediate Good; Recent Good; Remote Good  Judgment: Fair  Insight: Fair   Art therapist  Concentration: Good  Attention Span: Good  Recall: Good  Fund of Knowledge: Fair  Language: Good   Psychomotor Activity  Psychomotor Activity: Psychomotor Activity: Normal   Assets  Assets: Communication Skills; Desire for Improvement; Housing; Resilience; Social Support  Sleep  Sleep: Sleep: Good Number of Hours of Sleep: 7.5   Physical Exam: Physical Exam Vitals and nursing note reviewed.  HENT:     Head: Normocephalic.     Nose: Nose normal.     Mouth/Throat:     Pharynx: Oropharynx is clear.   Eyes:     Pupils: Pupils are equal, round, and reactive to light.    Cardiovascular:     Pulses: Normal pulses.  Pulmonary:     Effort: Pulmonary effort is normal.  Genitourinary:    Comments: Deferred  Musculoskeletal:        General: Normal range of motion.  Cervical back: Normal range of motion.   Skin:    General: Skin is dry.   Neurological:     General: No focal deficit present.     Mental Status: He is alert and oriented to person,  place, and time.    Review of Systems  Constitutional:  Negative for chills and fever.  HENT:  Negative for congestion and sore throat.   Eyes:  Negative for blurred vision.  Respiratory:  Negative for cough, shortness of breath and wheezing.   Cardiovascular:  Negative for chest pain and palpitations.  Gastrointestinal:  Negative for abdominal pain, blood in stool, constipation, diarrhea, heartburn, melena, nausea and vomiting.  Genitourinary:  Negative for dysuria.  Musculoskeletal:  Negative for joint pain and myalgias.  Skin:  Negative for rash.  Neurological:  Negative for dizziness, tingling, tremors, sensory change, speech change (Hx. alcoholism, chronic.), focal weakness, seizures, loss of consciousness, weakness and headaches.  Endo/Heme/Allergies:        NKDA  Psychiatric/Behavioral:  Positive for substance abuse (Hx. alcoholism, chronic.). Negative for depression, hallucinations, memory loss and suicidal ideas. The patient is not nervous/anxious and does not have insomnia.    Blood pressure 127/77, pulse 86, temperature 97.8 F (36.6 C), temperature source Oral, resp. rate 14, height 5' 7 (1.702 m), weight 106.7 kg, SpO2 99%. Body mass index is 36.84 kg/m.  Treatment Plan Summary: Daily contact with patient to assess and evaluate symptoms and progress in treatment and Medication management.   Principal/active diagnoses.  Alcohol use disorder. MDD (major depressive disorder), recurrent severe, without psychosis (HCC). Hx. GAD.   Other medical issues.  Acquired hypothyroidism.   Plan: The risks/benefits/side-effects/alternatives to the medications in use were discussed in detail with the patient and time was given for patient's questions. The patient consents to medication trial.   -Continue Sertraline  100 mg po daily for depression.  -Continue Seroquel  50 mg po Q bedtime for mood stability.  -Continue Nicotine  patch 14 mg topically Q 24 hours prn for nicotine   withdrawal management.  -Completed the CIWA detox protocols for ETOH withdrawal management.  -Continue Hydroxyzine  25 mg po tid prn for anxiety.  -Continue the agitation protocols as recommended (See MAR).   Other medical issues.  -Continue Albuterol  inhaler 1 puff Q 6 hrs prn for SOB. -Continue Synthroid  200 mcg po Q am prior to breakfast for hypothyroidism.  -Continue Vitamin B12 1,000 mcg po daily for B12 replacement.  -Initiated Keflex  500 mg po tid for reddened area to front part of left leg x 7 days.    Other PRNS -Continue Tylenol  650 mg every 6 hours PRN for mild pain -Continue Maalox 30 ml Q 4 hrs PRN for indigestion -Continue MOM 30 ml po Q 6 hrs for constipation  Safety and Monitoring: Voluntary admission to inpatient psychiatric unit for safety, stabilization and treatment Daily contact with patient to assess and evaluate symptoms and progress in treatment Patient's case to be discussed in multi-disciplinary team meeting Observation Level : q15 minute checks Vital signs: q12 hours Precautions: Safety  Discharge Planning: Social work and case management to assist with discharge planning and identification of hospital follow-up needs prior to discharge Estimated LOS: 5-7 days Discharge Concerns: Need to establish a safety plan; Medication compliance and effectiveness Discharge Goals: Return home with outpatient referrals for mental health follow-up including medication management/psychotherapy  Asuncion Layer, NP, pmhnp, fnp-bc. 06/24/2023, 6:04 PM Patient ID: Beatris Bough, male   DOB: 1975/11/13, 48 y.o.   MRN: [id number] Patient ID: Beatris Bough,  male   DOB: 1975/05/25, 48 y.o.   MRN: [id number]

## 2023-06-24 NOTE — Group Note (Signed)
 Date:  06/24/2023 Time:  9:18 AM  Group Topic/Focus:  Goals Group:   The focus of this group is to help patients establish daily goals to achieve during treatment and discuss how the patient can incorporate goal setting into their daily lives to aide in recovery.    Participation Level:  Did Not Attend  Participation Quality:    Affect:    Cognitive:    Insight:   Engagement in Group:    Modes of Intervention:    Additional Comments: Pt were aware of group time. Pt refused to attend.  Tita Form 06/24/2023, 9:18 AM

## 2023-06-24 NOTE — Progress Notes (Signed)
   06/24/23 0900  Psych Admission Type (Psych Patients Only)  Admission Status Voluntary  Psychosocial Assessment  Patient Complaints Depression;Anxiety  Eye Contact Fair  Facial Expression Animated  Affect Appropriate to circumstance  Speech Logical/coherent  Interaction Assertive  Motor Activity Other (Comment) (WNL)  Appearance/Hygiene Unremarkable  Behavior Characteristics Cooperative  Mood Pleasant  Thought Process  Coherency WDL  Content WDL  Delusions None reported or observed  Perception WDL  Hallucination None reported or observed  Judgment Impaired  Confusion None  Danger to Self  Current suicidal ideation? Denies  Description of Suicide Plan None  Agreement Not to Harm Self Yes  Description of Agreement Verbal  Danger to Others  Danger to Others None reported or observed     06/24/23 0900  Psych Admission Type (Psych Patients Only)  Admission Status Voluntary  Psychosocial Assessment  Patient Complaints Depression;Anxiety  Eye Contact Fair  Facial Expression Animated  Affect Appropriate to circumstance  Speech Logical/coherent  Interaction Assertive  Motor Activity Other (Comment) (WNL)  Appearance/Hygiene Unremarkable  Behavior Characteristics Cooperative  Mood Pleasant  Thought Process  Coherency WDL  Content WDL  Delusions None reported or observed  Perception WDL  Hallucination None reported or observed  Judgment Impaired  Confusion None  Danger to Self  Current suicidal ideation? Denies  Description of Suicide Plan None  Agreement Not to Harm Self Yes  Description of Agreement Verbal  Danger to Others  Danger to Others None reported or observed

## 2023-06-25 DIAGNOSIS — F332 Major depressive disorder, recurrent severe without psychotic features: Secondary | ICD-10-CM | POA: Diagnosis not present

## 2023-06-25 MED ORDER — HYDROCORTISONE 0.5 % EX CREA
TOPICAL_CREAM | Freq: Two times a day (BID) | CUTANEOUS | Status: DC
  Administered 2023-06-25 – 2023-06-26 (×3): 1 via TOPICAL
  Filled 2023-06-25: qty 28.35

## 2023-06-25 NOTE — Progress Notes (Signed)
Pt did not attend goals group. 

## 2023-06-25 NOTE — Plan of Care (Signed)
  Problem: Education: Goal: Knowledge of [facility] Education information/materials will improve Outcome: Progressing Goal: Emotional status will improve Outcome: Progressing Goal: Mental status will improve Outcome: Progressing Goal: Verbalization of understanding the information provided will improve Outcome: Progressing   Problem: Coping: Goal: Ability to verbalize frustrations and anger appropriately will improve Outcome: Progressing Goal: Ability to demonstrate self-control will improve Outcome: Progressing   Problem: Health Behavior/Discharge Planning: Goal: Compliance with treatment plan for underlying cause of condition will improve Outcome: Progressing   Problem: Physical Regulation: Goal: Ability to maintain clinical measurements within normal limits will improve Outcome: Progressing   Problem: Safety: Goal: Periods of time without injury will increase Outcome: Progressing

## 2023-06-25 NOTE — Progress Notes (Signed)
 [facility] MD Progress Note  06/25/2023 2:05 PM Danny West  MRN:  [id number]  Reason for admission: 48 y.o. male with a reported mental health history MDD, GAD, alcohol use disorder, who presented to the [facility] today with complaints of worsening depressive symptoms due to recently relapsing on alcohol related to several psychosocial stressors.   Daily notes: Danny West is seen. Chart reviewed. He presents with a bright affect this morning. He is making a good eye contact. He reports, I'm doing well. Honestly, I'm ready to go home. I started feeling a lot better yesterday as a result, I do not think I need 30-day substance abuse treatment program any more. The last times I became sober, I did it myself. I know I can do it again. I have a lot to do at home. My mood is very good. I got my brother in-aw's support. I don't think it is wise to spend so much money on rehab program when I can equally help myself. Patient has been informed of the recent result of his hepatitis B surface antigen that was reactive. He is currently referred to the [facility] department. At this time, patient does not appear to be in any apparent distress. He is currently requesting to be discharged to his place of residence as he does no longer want to go to a substance abuse treatment program. He denies feeling depressed or anxious. He says he slept well last night. He denies any side effects to his medications. I plan to discharge tomorrow if patient maintains stability. The treatment team is made aware. He currently denies any SIHI, AVH, delusional thoughts or paranoia. He does not appear to be responding to any internal stimuli. Continue current plan of care as already in progress. His vital signs remain stable. The reddened spot to his left leg appears stable. Patient is tolerating the antibiotic therapy. Encouraged to drink a lot of fluid.   Principal Problem: MDD (major depressive disorder),  recurrent severe, without psychosis (HCC)  Diagnosis: Principal Problem:   MDD (major depressive disorder), recurrent severe, without psychosis (HCC) Active Problems:   Acquired hypothyroidism   Alcohol use disorder, severe, dependence (HCC)   HLD (hyperlipidemia)   GAD (generalized anxiety disorder)   Asthma   Tobacco use disorder  Total Time spent with patient: 35 minutes.  Past Psychiatric History: See H&P.  Past Medical History:  Past Medical History:  Diagnosis Date   Alcohol use disorder, severe, dependence (HCC) 09/13/2021   Alcoholic hepatitis    Asthma    H/O suicide attempt 09/14/2021   June 2023 - took handful of zoloft  and naltrexone  while home alone, wrote on whiteboard at home, if you're alive tomorrow, you will live for your kids. Has not mentioned this to anyone Academic librarian included), no psych hospitalization   Hypothyroidism (acquired)     Past Surgical History:  Procedure Laterality Date   NO PAST SURGERIES     Family History: History reviewed. No pertinent family history.  Family Psychiatric  History: See H&P.  Social History:  Social History   Substance and Sexual Activity  Alcohol Use Yes   Comment: drinks (4) 16 oz beers daily     Social History   Substance and Sexual Activity  Drug Use Never    Social History   Socioeconomic History   Marital status: Divorced    Spouse name: Not on file   Number of children: 2   Years of education: 12   Highest education  level: Not on file  Occupational History   Not on file  Tobacco Use   Smoking status: Never   Smokeless tobacco: Current    Types: Snuff  Vaping Use   Vaping status: Every Day   Substances: THC  Substance and Sexual Activity   Alcohol use: Yes    Comment: drinks (4) 16 oz beers daily   Drug use: Never   Sexual activity: Not Currently  Other Topics Concern   Not on file  Social History Narrative   Pt lives alone and works at [employer]. Divorced 11/2019; twin boys are 50 years  old   Social Drivers of Corporate investment banker Strain: Not on file  Food Insecurity: No Food Insecurity (06/21/2023)   Hunger Vital Sign    Worried About Running Out of Food in the Last Year: Never true    Ran Out of Food in the Last Year: Never true  Transportation Needs: No Transportation Needs (06/21/2023)   PRAPARE - Administrator, Civil Service (Medical): No    Lack of Transportation (Non-Medical): No  Physical Activity: Not on file  Stress: Not on file  Social Connections: Unknown (05/25/2021)   [facility]   Social Network    Social Network: Not on file   Additional Social History:   Sleep: Good  Appetite:  Good  Current Medications: Current Facility-Administered Medications  Medication Dose Route Frequency Provider Last Rate Last Admin   acetaminophen  (TYLENOL ) tablet 650 mg  650 mg Oral Q6H PRN Nkwenti, Arzella Laurence, NP       albuterol  (VENTOLIN  HFA) 108 (90 Base) MCG/ACT inhaler 1 puff  1 puff Inhalation Q6H PRN Dorthea Gauze, NP       alum & mag hydroxide-simeth (MAALOX/MYLANTA) 200-200-20 MG/5ML suspension 30 mL  30 mL Oral Q4H PRN Nkwenti, Doris, NP       cephALEXin  (KEFLEX ) capsule 500 mg  500 mg Oral Q8H Matias Thurman I, NP   500 mg at 06/25/23 4098   cyanocobalamin  (VITAMIN B12) tablet 1,000 mcg  1,000 mcg Oral Daily Zouev, Dmitri, MD   1,000 mcg at 06/25/23 1191   haloperidol  lactate (HALDOL ) injection 10 mg  10 mg Intramuscular TID PRN Robet Chiquito, NP       And   diphenhydrAMINE  (BENADRYL ) injection 50 mg  50 mg Intramuscular TID PRN Robet Chiquito, NP       And   LORazepam  (ATIVAN ) injection 2 mg  2 mg Intramuscular TID PRN Robet Chiquito, NP       haloperidol  lactate (HALDOL ) injection 5 mg  5 mg Intramuscular TID PRN Robet Chiquito, NP       And   diphenhydrAMINE  (BENADRYL ) injection 50 mg  50 mg Intramuscular TID PRN Robet Chiquito, NP       And   LORazepam  (ATIVAN ) injection 2 mg  2 mg Intramuscular TID PRN Robet Chiquito, NP        hydrocortisone cream 0.5 %   Topical BID Asuncion Layer I, NP   1 Application at 06/25/23 1028   hydrOXYzine  (ATARAX ) tablet 25 mg  25 mg Oral TID PRN Robet Chiquito, NP   25 mg at 06/25/23 1002   levothyroxine  (SYNTHROID ) tablet 200 mcg  200 mcg Oral Q0600 Dorthea Gauze, NP   200 mcg at 06/25/23 4782   magnesium  hydroxide (MILK OF MAGNESIA) suspension 30 mL  30 mL Oral Daily PRN Robet Chiquito, NP       multivitamin with minerals tablet 1 tablet  1  tablet Oral Daily Robet Chiquito, NP   1 tablet at 06/25/23 0833   nicotine  polacrilex (NICORETTE ) gum 2 mg  2 mg Oral PRN Zouev, Dmitri, MD   2 mg at 06/25/23 1252   OLANZapine  zydis (ZYPREXA ) disintegrating tablet 5 mg  5 mg Oral TID PRN Robet Chiquito, NP       QUEtiapine  (SEROQUEL ) tablet 50 mg  50 mg Oral QHS PRN Onuoha, Chinwendu V, NP   50 mg at 06/24/23 2118   sertraline  (ZOLOFT ) tablet 100 mg  100 mg Oral Daily Zouev, Dmitri, MD   100 mg at 06/25/23 1610   simethicone (MYLICON) chewable tablet 80 mg  80 mg Oral QID PRN Asuncion Layer I, NP       thiamine  (Vitamin B-1) tablet 100 mg  100 mg Oral Daily Nkwenti, Doris, NP   100 mg at 06/25/23 9604   Lab Results:  No results found for this or any previous visit (from the past 48 hours).  Blood Alcohol level:  Lab Results  Component Value Date   ETH 285 (H) 06/20/2023   ETH 88 (H) 01/20/2023   Metabolic Disorder Labs: Lab Results  Component Value Date   HGBA1C 5.4 06/20/2023   MPG 108.28 06/20/2023   MPG 134.11 01/20/2023   Lab Results  Component Value Date   PROLACTIN 10.0 03/09/2022   Lab Results  Component Value Date   CHOL 238 (H) 06/21/2023   TRIG 966 (H) 06/21/2023   HDL 48 06/21/2023   CHOLHDL 5.0 06/21/2023   VLDL UNABLE TO CALCULATE IF TRIGLYCERIDE OVER 400 mg/dL 54/09/8117   LDLCALC UNABLE TO CALCULATE IF TRIGLYCERIDE OVER 400 mg/dL 14/78/2956   LDLCALC UNABLE TO CALCULATE IF TRIGLYCERIDE OVER 400 mg/dL 21/30/8657   Physical Findings: AIMS:  , ,  ,  ,    CIWA:   CIWA-Ar Total: 2 COWS:     Musculoskeletal: Strength & Muscle Tone: within normal limits Gait & Station: normal Patient leans: N/A  Psychiatric Specialty Exam:  Presentation  General Appearance:  Casual; Fairly Groomed  Eye Contact: Good  Speech: Clear and Coherent; Normal Rate  Speech Volume: Normal  Handedness: Right   Mood and Affect  Mood: -- (Hopeful.)  Affect: Congruent   Thought Process  Thought Processes: Coherent; Goal Directed; Linear  Descriptions of Associations:Intact  Orientation:Full (Time, Place and Person)  Thought Content:Logical  History of Schizophrenia/Schizoaffective disorder:No  Duration of Psychotic Symptoms: NA  Hallucinations: NA.   Ideas of Reference:None  Suicidal Thoughts:Suicidal Thoughts: No   Homicidal Thoughts:Homicidal Thoughts: No    Sensorium  Memory: Immediate Good; Recent Good; Remote Good  Judgment: Fair  Insight: Fair   Art therapist  Concentration: Good  Attention Span: Good  Recall: Good  Fund of Knowledge: Fair  Language: Good   Psychomotor Activity  Psychomotor Activity: Psychomotor Activity: Normal   Assets  Assets: Communication Skills; Desire for Improvement; Housing; Resilience; Social Support  Sleep  Sleep: Sleep: Good Number of Hours of Sleep: 7.5   Physical Exam: Physical Exam Vitals and nursing note reviewed.  HENT:     Head: Normocephalic.     Nose: Nose normal.     Mouth/Throat:     Pharynx: Oropharynx is clear.   Eyes:     Pupils: Pupils are equal, round, and reactive to light.    Cardiovascular:     Pulses: Normal pulses.  Pulmonary:     Effort: Pulmonary effort is normal.  Genitourinary:    Comments: Deferred  Musculoskeletal:  General: Normal range of motion.     Cervical back: Normal range of motion.   Skin:    General: Skin is dry.   Neurological:     General: No focal deficit present.     Mental Status: He is alert  and oriented to person, place, and time.   Review of Systems  Constitutional:  Negative for chills and fever.  HENT:  Negative for congestion and sore throat.   Eyes:  Negative for blurred vision.  Respiratory:  Negative for cough, shortness of breath and wheezing.   Cardiovascular:  Negative for chest pain and palpitations.  Gastrointestinal:  Negative for abdominal pain, blood in stool, constipation, diarrhea, heartburn, melena, nausea and vomiting.  Genitourinary:  Negative for dysuria.  Musculoskeletal:  Negative for joint pain and myalgias.  Skin:  Negative for rash.  Neurological:  Negative for dizziness, tingling, tremors, sensory change, speech change (Hx. alcoholism, chronic.), focal weakness, seizures, loss of consciousness, weakness and headaches.  Endo/Heme/Allergies:        NKDA  Psychiatric/Behavioral:  Positive for substance abuse (Hx. alcoholism, chronic.). Negative for depression, hallucinations, memory loss and suicidal ideas. The patient is not nervous/anxious and does not have insomnia.    Blood pressure 112/70, pulse 88, temperature 98.7 F (37.1 C), temperature source Oral, resp. rate 14, height 5' 7 (1.702 m), weight 106.7 kg, SpO2 98%. Body mass index is 36.84 kg/m.  Treatment Plan Summary: Daily contact with patient to assess and evaluate symptoms and progress in treatment and Medication management.   Principal/active diagnoses.  Alcohol use disorder. MDD (major depressive disorder), recurrent severe, without psychosis (HCC). Hx. GAD.   Other medical issues.  Acquired hypothyroidism.   Plan: The risks/benefits/side-effects/alternatives to the medications in use were discussed in detail with the patient and time was given for patient's questions. The patient consents to medication trial.   -Continue Sertraline  100 mg po daily for depression.  -Continue Seroquel  50 mg po Q bedtime for mood stability.  -Continue Nicotine  patch 14 mg topically Q 24 hours prn  for nicotine  withdrawal management.  -Completed the CIWA detox protocols for ETOH withdrawal management.  -Continue Hydroxyzine  25 mg po tid prn for anxiety.  -Continue the agitation protocols as recommended (See MAR).   Other medical issues.  -Continue Albuterol  inhaler 1 puff Q 6 hrs prn for SOB. -Continue Synthroid  200 mcg po Q am prior to breakfast for hypothyroidism.  -Continue Vitamin B12 1,000 mcg po daily for B12 replacement.  -Continue Keflex  500 mg po tid for reddened area to front part of left leg x 7 days.  -Continue hydrocortisone cream 0.5% bid to affect area of the left leg.    Other PRNS -Continue Tylenol  650 mg every 6 hours PRN for mild pain -Continue Maalox 30 ml Q 4 hrs PRN for indigestion -Continue MOM 30 ml po Q 6 hrs for constipation  Safety and Monitoring: Voluntary admission to inpatient psychiatric unit for safety, stabilization and treatment Daily contact with patient to assess and evaluate symptoms and progress in treatment Patient's case to be discussed in multi-disciplinary team meeting Observation Level : q15 minute checks Vital signs: q12 hours Precautions: Safety  Discharge Planning: Social work and case management to assist with discharge planning and identification of hospital follow-up needs prior to discharge Estimated LOS: 5-7 days Discharge Concerns: Need to establish a safety plan; Medication compliance and effectiveness Discharge Goals: Return home with outpatient referrals for mental health follow-up including medication management/psychotherapy  Asuncion Layer, NP, pmhnp, fnp-bc. 06/25/2023,  2:05 PM Patient ID: Artin Mceuen, male   DOB: 02-19-1975, 48 y.o.   MRN: [id number] Patient ID: Elma Limas, male   DOB: 31-Jul-1975, 48 y.o.   MRN: [id number] Patient ID: Ameir Faria, male   DOB: 1975/04/24, 48 y.o.   MRN: [id number]

## 2023-06-25 NOTE — Progress Notes (Addendum)
 D. Pt presents anxious, but has been friendly upon approach- reported that he was ready for discharge, stating that he was ready to get on with life. Per pt's self inventory, pt rated his depression,hopelessness and anxiety a 5/3/3, respectively. Pt reported sleeping well, described his appetite as 'fair', energy level as 'low', and concentration as 'good'. Pt currently denies SI/HI and AVH  A. Labs and vitals monitored. Pt given and educated on medications. Pt supported emotionally and encouraged to express concerns and ask questions.   R. Pt remains safe with 15 minute checks. Will continue POC.    06/25/23 1000  Psych Admission Type (Psych Patients Only)  Admission Status Voluntary  Psychosocial Assessment  Patient Complaints Depression;Anxiety  Eye Contact Fair  Facial Expression Animated  Affect Appropriate to circumstance  Speech Logical/coherent  Interaction Assertive  Motor Activity Other (Comment) (steady gait)  Appearance/Hygiene Unremarkable  Behavior Characteristics Cooperative  Mood Pleasant  Thought Process  Coherency WDL  Content WDL  Delusions None reported or observed  Perception WDL  Hallucination None reported or observed  Judgment Limited  Confusion None  Danger to Self  Current suicidal ideation? Denies  Danger to Others  Danger to Others None reported or observed

## 2023-06-25 NOTE — Progress Notes (Signed)
   06/25/23 0000  Psych Admission Type (Psych Patients Only)  Admission Status Voluntary  Psychosocial Assessment  Patient Complaints Depression;Anxiety  Eye Contact Fair  Facial Expression Animated  Affect Appropriate to circumstance  Speech Logical/coherent  Interaction Assertive  Motor Activity  (WNL)  Appearance/Hygiene Other (Comment) (Appropriate)  Behavior Characteristics Cooperative  Mood Pleasant  Thought Process  Coherency WDL  Content WDL  Delusions None reported or observed  Perception WDL  Hallucination None reported or observed  Judgment Limited  Confusion None  Danger to Self  Current suicidal ideation? Denies  Agreement Not to Harm Self Yes  Description of Agreement Notify Staff

## 2023-06-25 NOTE — Plan of Care (Signed)
 ?  Problem: Education: ?Goal: Knowledge of [place] [facility] Education information/materials will improve ?Outcome: Progressing ?Goal: Emotional status will improve ?Outcome: Progressing ?  ?Problem: Activity: ?Goal: Interest or engagement in activities will improve ?Outcome: Progressing ?  ?

## 2023-06-25 NOTE — [facility] Group Notes (Signed)
 [facility] Group Notes:  (Nursing/MHT/Case Management/Adjunct)  Date:  06/25/2023  Time:  9:18 PM  Type of Therapy:  Psychoeducational Skills  Participation Level:  Active  Participation Quality:  Appropriate  Affect:  Appropriate  Cognitive:  Appropriate  Insight:  Improving  Engagement in Group:  Developing/Improving  Modes of Intervention:  Education  Summary of Progress/Problems: His goal for today was to work on his discharge. Patient states that he does not have any discharge plans and will just go home. The patient's goal for tomorrow is to get discharged.   Elfrida Pixley S 06/25/2023, 9:18 PM

## 2023-06-26 DIAGNOSIS — F332 Major depressive disorder, recurrent severe without psychotic features: Secondary | ICD-10-CM | POA: Diagnosis not present

## 2023-06-26 MED ORDER — QUETIAPINE FUMARATE 50 MG PO TABS: 50.0000 mg | ORAL_TABLET | Freq: Every evening | ORAL | 0 refills | Status: AC | PRN

## 2023-06-26 MED ORDER — LEVOTHYROXINE SODIUM 200 MCG PO TABS: 200.0000 ug | ORAL_TABLET | Freq: Every day | ORAL | 0 refills | Status: AC

## 2023-06-26 MED ORDER — SERTRALINE HCL 100 MG PO TABS: 100.0000 mg | ORAL_TABLET | Freq: Every day | ORAL | 0 refills | Status: AC

## 2023-06-26 MED ORDER — CEPHALEXIN 500 MG PO CAPS: 500.0000 mg | ORAL_CAPSULE | Freq: Three times a day (TID) | ORAL | 0 refills | Status: AC

## 2023-06-26 MED ORDER — HYDROXYZINE HCL 25 MG PO TABS: 25.0000 mg | ORAL_TABLET | Freq: Three times a day (TID) | ORAL | 0 refills | Status: AC | PRN

## 2023-06-26 MED ORDER — HYDROCORTISONE 0.5 % EX CREA: TOPICAL_CREAM | Freq: Two times a day (BID) | CUTANEOUS | Status: AC

## 2023-06-26 MED ORDER — CYANOCOBALAMIN 1000 MCG PO TABS: 1000.0000 ug | ORAL_TABLET | Freq: Every day | ORAL | 0 refills | Status: AC

## 2023-06-26 MED ORDER — SIMETHICONE 80 MG PO CHEW: 80.0000 mg | CHEWABLE_TABLET | Freq: Four times a day (QID) | ORAL | Status: AC | PRN

## 2023-06-26 MED ORDER — NICOTINE POLACRILEX 2 MG MT GUM: 2.0000 mg | CHEWING_GUM | OROMUCOSAL | 0 refills | Status: AC | PRN

## 2023-06-26 MED ORDER — VITAMIN B-1 100 MG PO TABS: 100.0000 mg | ORAL_TABLET | Freq: Every day | ORAL | 0 refills | Status: AC

## 2023-06-26 NOTE — Progress Notes (Signed)
Pt discharged to lobby with taxi voucher. Pt was stable and appreciative at that time. All papers, samples, and prescriptions were given and valuables returned. Suicide safety plan completed.Verbal understanding expressed. Denies SI/HI and A/VH. Pt given opportunity to express concerns and ask questions.

## 2023-06-26 NOTE — Progress Notes (Addendum)
 D. Pt has been appropriate, friendly during interactions- visible in the milieu, observed interacting appropriately with peers and staff. Per pt's self inventory, pt rated his depression,hopelessness and anxiety a 3/3/0, respectively. Pt's stated goal today is to discharge. Pt denies SI/HI and AVH and doesn't appear to be responding to internal stimuli.  A. Labs and vitals monitored. Pt given and educated on medications. Pt supported emotionally and encouraged to express concerns and ask questions.   R. Pt remains safe with 15 minute checks. Will continue POC.    06/26/23 0800  Psych Admission Type (Psych Patients Only)  Admission Status Voluntary  Psychosocial Assessment  Patient Complaints None  Eye Contact Fair  Facial Expression Animated  Affect Appropriate to circumstance  Speech Logical/coherent  Interaction Assertive  Motor Activity Other (Comment) (steady gait)  Appearance/Hygiene Unremarkable  Behavior Characteristics Cooperative  Mood Pleasant  Thought Process  Coherency WDL  Content WDL  Delusions None reported or observed  Perception WDL  Hallucination None reported or observed  Judgment Limited  Confusion None  Danger to Self  Current suicidal ideation? Denies  Agreement Not to Harm Self Yes  Description of Agreement agreed to contact staff before acting on harmful thoughts  Danger to Others  Danger to Others None reported or observed

## 2023-06-26 NOTE — Progress Notes (Signed)
  [facility] Adult Case Management Discharge Plan :  Will you be returning to the same living situation after discharge:  Yes,  Patient to return home.  At discharge, do you have transportation home?: Yes,  CSW arranged transportation on patient's behalf.  Do you have the ability to pay for your medications: Yes,  TRILLIUM / TRILLIUM 3-WAY  Release of information consent forms completed and in the chart;  Patient's signature needed at discharge.  Patient to Follow up at:  Follow-up Information     [facility]. Go on 06/28/2023.   Specialty: Professional Counselor Why: Please go on 06/28/23 at 9:00 am to re-establish care for therapy services.  This is not an appointment, it is a suggested time.  New and inactive patients must go between 9 am and 1 pm, Monday through Friday. Contact information: [address]  [address] [place] [address] [phone]         [facility]. Go on 07/05/2023.   Specialty: Behavioral Health Why: Please go to this provider on 07/05/23 at 7:00 am for an assessment, to obtain medication management services.  You may also go Monday through Friday, arrive by 7:00 am. Contact information: [address] [place]  [address] [phone]        Department, [facility] .   Contact information: [address] [place] [address] [phone]         [facility] Follow up.   Why: Referral made Contact information: [address] [place] [address] [phone]                 Next level of care provider has access to [facility] Link:no  Safety Planning and Suicide Prevention discussed: Yes,  Education Completed; Abe Hodgkins (brother-in-law) [phone], has been identified by the patient as the family member/significant other with whom the patient will be residing, and identified as the person(s) who will aid the patient in the event of  a mental health crisis (suicidal ideations/suicide attempt).  With written consent from the patient, the family member/significant other has been provided the following suicide prevention education, prior to the and/or following the discharge of the patient.     Has patient been referred to the Quitline?: Patient refused referral for treatment  Patient has been referred for addiction treatment: Yes, the patient will follow up with an outpatient provider for substance use disorder. Psychiatrist/APP: appointment made and Therapist: appointment made  Roselle Conner, LCSW 06/26/2023, 9:46 AM

## 2023-06-26 NOTE — Discharge Summary (Signed)
 Physician Discharge Summary Note  Patient:  Danny West is an 48 y.o., male  MRN:  [id number]  DOB:  [birth year]  Patient phone:  [phone] (home)   Patient address:   [address] [place] [address],   Total Time spent with patient: Greater than 30 minutes.  Date of Admission:  06/21/2023  Date of Discharge: 06-26-23  Reason for Admission: Worsening depressive symptoms following a recent relapse into alcohol use, attributed to several psychosocial stressors.  Principal Problem: MDD (major depressive disorder), recurrent severe, without psychosis (HCC) Discharge Diagnoses: Principal Problem:   MDD (major depressive disorder), recurrent severe, without psychosis (HCC) Active Problems:   Acquired hypothyroidism   Alcohol use disorder, severe, dependence (HCC)   HLD (hyperlipidemia)   GAD (generalized anxiety disorder)   Asthma   Tobacco use disorder  Past Psychiatric History: See H&P.  Past Medical History:  Past Medical History:  Diagnosis Date   Alcohol use disorder, severe, dependence (HCC) 09/13/2021   Alcoholic hepatitis    Asthma    H/O suicide attempt 09/14/2021   June 2023 - took handful of zoloft  and naltrexone  while home alone, wrote on whiteboard at home, if you're alive tomorrow, you will live for your kids. Has not mentioned this to anyone Academic librarian included), no psych hospitalization   Hypothyroidism (acquired)     Past Surgical History:  Procedure Laterality Date   NO PAST SURGERIES     Family History: History reviewed. No pertinent family history.  Family Psychiatric  History: See H&P.  Social History:  Social History   Substance and Sexual Activity  Alcohol Use Yes   Comment: drinks (4) 16 oz beers daily     Social History   Substance and Sexual Activity  Drug Use Never    Social History   Socioeconomic History   Marital status: Divorced    Spouse name: Not on file   Number of children: 2   Years of education:  12   Highest education level: Not on file  Occupational History   Not on file  Tobacco Use   Smoking status: Never   Smokeless tobacco: Current    Types: Snuff  Vaping Use   Vaping status: Every Day   Substances: THC  Substance and Sexual Activity   Alcohol use: Yes    Comment: drinks (4) 16 oz beers daily   Drug use: Never   Sexual activity: Not Currently  Other Topics Concern   Not on file  Social History Narrative   Pt lives alone and works at [employer]. Divorced 11/2019; twin boys are 50 years old   Social Drivers of Corporate investment banker Strain: Not on file  Food Insecurity: No Food Insecurity (06/21/2023)   Hunger Vital Sign    Worried About Running Out of Food in the Last Year: Never true    Ran Out of Food in the Last Year: Never true  Transportation Needs: No Transportation Needs (06/21/2023)   PRAPARE - Administrator, Civil Service (Medical): No    Lack of Transportation (Non-Medical): No  Physical Activity: Not on file  Stress: Not on file  Social Connections: Unknown (05/25/2021)   Received from [place]   Social Network    Social Network: Not on file   Hospital Course: (Per admission evaluation notes): 48 year old male presented to the [facility] on June 20, 2023 with worsening depressive symptoms following a recent relapse into alcohol use, attributed to several psychosocial stressors.  He was admitted to the [facility] on June 21, 2023 for treatment and stabilization. His psychiatric history is notable for MDD, GAD and alcohol use disorder.  His past medical history includes hypothyroidism and hyperlipidemia.   Prior to this discharge, Danny West was seen & evaluated for mood stability by his treatment team. The current laboratory findings were reviewed (stable). The nurses notes & vital signs were reviewed as well. There are no current mental health or medical issues that should prevent this discharge at  this time. Patient is being discharged to continue mental health care as noted below.   After the above admission evaluation, Danny West's presenting symptoms were noted. He was recommended for mood stabilization treatments as he was not presenting with any alcohol withdrawal symptoms. The medication regimen targeting those presenting symptoms were discussed with him & initiated with his consent. He was medicated, stabilized & discharged on the medications as listed on his discharge medication lists below. Besides the mood stabilization treatments, Danny West was also enrolled & participated in the group counseling sessions being offered & held on this unit. He learned coping skills. Including in his treatment plan was a recommendation for a long term substance abuse treatment program for his substance use disorder. Initially, patient was in agreement with this plan. However, he did change his mind about going to a long term substance abuse treatment program citing that the last time he was sober from alcoholism that he did it on his own. He did believe that he is capable of doing that again. He made a decision to discharge to his home instead.   Besides the alcohol/cannabis use disorders, Danny West presented other significant pre-existing medical issues that required treatment including some reddened area to the front of his left leg with some itching of which he was treated with antibiotic therapy & hydrocortisone  cream applications to the reddened area. He tolerated his treatment regimen without any adverse effects or reactions reported. Danny West symptoms responded well to his treatment regimen warranting this discharge. Patient is also mentally/medically stable & agreeable to this discharge.  During the course of this this hospitalization, there were no instances of behavioral issues that required restraints or immediate intervention displayed by patient. Danny West remained safe on the unit throughout his hospitalization.  There were no instances of self-harming behaviors reported or noted by staff. There were no threats to other patients/staff. Danny West remained cooperative to his daily routines & in taking his treatment regimen as recommended by his treatment team. He participated in the group sessions, interacted with staff and the other patients appropriately. Over the course of this hospitalization, patient's symptoms responded well to his treatment regimen & his improved. On this day of his hospital discharge, patient denies any thoughts of self-harm, suicidal/homicidal ideations. He is not observed to be responding to internal any internal stimuli. He has been compliant with his recommended treatment regimen. He currently denies any alcohol withdrawal symptoms. He is currently showing readiness for discharge & is future-oriented thinking. Patient is encouraged to keep all psychiatric appointments and continue with his medications as prescribed. Danny West left [facility] with all personal belongings in no apparent distress. Transportation per taxi cab. [facility] assisted with taxi fare.     Physical Findings: AIMS:  , ,  ,  ,  ,  ,   CIWA:  CIWA-Ar Total: 2 COWS:     Musculoskeletal: Strength & Muscle Tone: within normal limits Gait & Station: normal Patient leans: N/A   Psychiatric Specialty Exam:  Presentation  General  Appearance:  Casual; Fairly Groomed  Eye Contact: Good  Speech: Clear and Coherent; Normal Rate  Speech Volume: Normal  Handedness: Right   Mood and Affect  Mood: -- (Hopeful.)  Affect: Congruent   Thought Process  Thought Processes: Coherent; Goal Directed; Linear  Descriptions of Associations:Intact  Orientation:Full (Time, Place and Person)  Thought Content:Logical  History of Schizophrenia/Schizoaffective disorder:No  Duration of Psychotic Symptoms:No data recorded Hallucinations:No data recorded Ideas of Reference:None  Suicidal Thoughts:No data recorded Homicidal  Thoughts:No data recorded  Sensorium  Memory: Immediate Good; Recent Good; Remote Good  Judgment: Fair  Insight: Fair   Art therapist  Concentration: Good  Attention Span: Good  Recall: Good  Fund of Knowledge: Fair  Language: Good   Psychomotor Activity  Psychomotor Activity:No data recorded  Assets  Assets: Communication Skills; Desire for Improvement; Housing; Resilience; Social Support   Sleep  Sleep:No data recorded Estimated Sleeping Duration (Last 24 Hours): 6.50-7.75 hours   Physical Exam: Physical Exam Vitals and nursing note reviewed.  HENT:     Head: Normocephalic.     Nose: Nose normal.   Cardiovascular:     Rate and Rhythm: Normal rate.     Pulses: Normal pulses.  Pulmonary:     Effort: Pulmonary effort is normal.  Genitourinary:    Comments: Deferred  Musculoskeletal:        General: Normal range of motion.     Cervical back: Normal range of motion.   Skin:    General: Skin is dry.   Neurological:     General: No focal deficit present.     Mental Status: He is alert and oriented to person, place, and time. Mental status is at baseline.   Review of Systems  Constitutional:  Negative for chills, diaphoresis and fever.  HENT:  Negative for congestion and sore throat.   Eyes:        Wear glasses.  Respiratory:  Negative for cough, shortness of breath and wheezing.   Cardiovascular:  Negative for chest pain and palpitations.  Gastrointestinal:  Negative for abdominal pain, constipation, diarrhea, heartburn, nausea and vomiting.  Genitourinary:  Negative for dysuria.  Musculoskeletal:  Negative for joint pain and myalgias.  Skin:  Positive for rash (Front of left leg. Tx in progress.).  Neurological:  Negative for dizziness, tingling, tremors, sensory change, speech change, focal weakness, seizures, loss of consciousness, weakness and headaches.  Endo/Heme/Allergies:        NKDA  Psychiatric/Behavioral:  Positive for  depression (Hx of (stable on medication).) and substance abuse (Hx. alcoholism (stable upon discharge).). Negative for hallucinations, memory loss and suicidal ideas. The patient is not nervous/anxious and does not have insomnia.    Blood pressure 98/69, pulse 82, temperature 98.1 F (36.7 C), temperature source Oral, resp. rate 16, height 5' 7 (1.702 m), weight 106.7 kg, SpO2 97%. Body mass index is 36.84 kg/m.   Social History   Tobacco Use  Smoking Status Never  Smokeless Tobacco Current   Types: Snuff   Tobacco Cessation:  An FDA-approved tobacco cessation medication recommended at discharge  Blood Alcohol level:  Lab Results  Component Value Date   ETH 285 (H) 06/20/2023   ETH 88 (H) 01/20/2023   Metabolic Disorder Labs:  Lab Results  Component Value Date   HGBA1C 5.4 06/20/2023   MPG 108.28 06/20/2023   MPG 134.11 01/20/2023   Lab Results  Component Value Date   PROLACTIN 10.0 03/09/2022   Lab Results  Component Value Date   CHOL  238 (H) 06/21/2023   TRIG 966 (H) 06/21/2023   HDL 48 06/21/2023   CHOLHDL 5.0 06/21/2023   VLDL UNABLE TO CALCULATE IF TRIGLYCERIDE OVER 400 mg/dL 93/89/7974   LDLCALC UNABLE TO CALCULATE IF TRIGLYCERIDE OVER 400 mg/dL 93/89/7974   LDLCALC UNABLE TO CALCULATE IF TRIGLYCERIDE OVER 400 mg/dL 93/90/7974   See Psychiatric Specialty Exam and Suicide Risk Assessment completed by Attending Physician prior to discharge.  Discharge destination:  Home  Is patient on multiple antipsychotic therapies at discharge:  No   Has Patient had three or more failed trials of antipsychotic monotherapy by history:  No  Recommended Plan for Multiple Antipsychotic Therapies: NA  Allergies as of 06/26/2023   No Known Allergies      Medication List     TAKE these medications      Indication  albuterol  108 (90 Base) MCG/ACT inhaler Commonly known as: VENTOLIN  HFA Inhale 1 puff into the lungs every 6 (six) hours as needed for wheezing or shortness  of breath.  Indication: Shortness of breath.   cephALEXin  500 MG capsule Commonly known as: KEFLEX  Take 1 capsule (500 mg total) by mouth every 8 (eight) hours. For skin problems.  Indication: Skin infection   cyanocobalamin  1000 MCG tablet Take 1 tablet (1,000 mcg total) by mouth daily. For low B-12 Start taking on: June 27, 2023  Indication: Inadequate Vitamin B12   hydrocortisone  cream 0.5 % Apply topically 2 (two) times daily. For itching.  Indication: Itching   hydrOXYzine  25 MG tablet Commonly known as: ATARAX  Take 1 tablet (25 mg total) by mouth 3 (three) times daily as needed for anxiety.  Indication: Feeling Anxious   levothyroxine  200 MCG tablet Commonly known as: SYNTHROID  Take 1 tablet (200 mcg total) by mouth daily at 6 (six) AM. For hypothyroidism. Start taking on: June 27, 2023 What changed:  when to take this additional instructions  Indication: Underactive Thyroid    nicotine  polacrilex 2 MG gum Commonly known as: NICORETTE  Take 1 each (2 mg total) by mouth as needed. (May buy from over the counter): for smoking cessation.  Indication: Nicotine  Addiction   QUEtiapine  50 MG tablet Commonly known as: SEROquel  Take 1 tablet (50 mg total) by mouth at bedtime as needed (Insomnia). What changed:  when to take this reasons to take this  Indication: Trouble Sleeping   sertraline  100 MG tablet Commonly known as: Zoloft  Take 1 tablet (100 mg total) by mouth daily. For depression What changed: additional instructions  Indication: Generalized Anxiety Disorder, Major Depressive Disorder   simethicone  80 MG chewable tablet Commonly known as: MYLICON Chew 1 tablet (80 mg total) by mouth 4 (four) times daily as needed. For Gas  Indication: Gas   thiamine  100 MG tablet Commonly known as: Vitamin B-1 Take 1 tablet (100 mg total) by mouth daily. For low thiamine . Start taking on: June 27, 2023  Indication: Deficiency of Vitamin B1        Follow-up  Information     [place] Go on 06/28/2023.   Specialty: Professional Counselor Why: Please go on 06/28/23 at 9:00 am to re-establish care for therapy services.  This is not an appointment, it is a suggested time.  New and inactive patients must go between 9 am and 1 pm, Monday through Friday. Contact information: [address]  [address] [place] [address] [phone]         [facility]. Go on 07/05/2023.   Specialty: Behavioral Health Why: Please go  to this provider on 07/05/23 at 7:00 am for an assessment, to obtain medication management services.  You may also go Monday through Friday, arrive by 7:00 am. Contact information: [address] [place]  [address] [phone]        Department, [facility] .   Contact information: [address] [place] [address] [phone]         [facility] Follow up.   Why: Referral made Contact information: [address] [place] [address] [phone]                Follow-up recommendations:  Activity: as tolerated Diet: heart healthy  Comments:  -Follow-up with your outpatient psychiatric provider -instructions on appointment date, time, and address (location) are provided to you in discharge paperwork.  -Take your psychiatric medications as prescribed at discharge - instructions are provided to you in the discharge paperwork  -Follow-up with outpatient primary care doctor and other specialists -for management of preventative medicine and chronic medical issues  -Testing: Follow-up with outpatient provider for abnormal lab results: NA  -If you are prescribed an atypical antipsychotic medication, we recommend that your outpatient psychiatrist follow routine screening for side effects within 3 months of discharge, including monitoring: AIMS scale, height, weight, blood pressure, fasting lipid panel,  HbA1c, and fasting blood sugar.   -Recommend total abstinence from alcohol, tobacco, and other illicit drug use at discharge.   -If your psychiatric symptoms recur, worsen, or if you have side effects to your psychiatric medications, call your outpatient psychiatric provider, 911, [phone] or go to the nearest emergency department.  -If suicidal thoughts occur, immediately call your outpatient psychiatric provider, 911, 988 or go to the nearest emergency department.  Signed: Mac Bolster, NP, pmhnp, fnp-bc. 06/26/2023, 10:00 AM

## 2023-06-26 NOTE — Group Note (Signed)
 LCSW Group Therapy Note  Group Date: 06/26/2023 Start Time: 1100 End Time: 1200   Type of Therapy and Topic:  Group Therapy - Healthy vs Unhealthy Coping Skills  Participation Level:  Active   Description of Group The focus of this group was to determine what unhealthy coping techniques typically are used by group members and what healthy coping techniques would be helpful in coping with various problems. Patients were guided in becoming aware of the differences between healthy and unhealthy coping techniques. Patients were asked to identify 2-3 healthy coping skills they would like to learn to use more effectively.  Therapeutic Goals Patients learned that coping is what human beings do all day long to deal with various situations in their lives Patients defined and discussed healthy vs unhealthy coping techniques Patients identified their preferred coping techniques and identified whether these were healthy or unhealthy Patients determined 2-3 healthy coping skills they would like to become more familiar with and use more often. Patients provided support and ideas to each other   Summary of Patient Progress: During group, patient expressed openly. Patient proved open to input from peers and feedback from CSW. Patient demonstrated proficient insight into the subject matter, was respectful of peers, and participated throughout the entire session.   Therapeutic Modalities Cognitive Behavioral Therapy Motivational Interviewing  Danny West M Mohit Zirbes, LCSWA 06/26/2023  11:44 AM

## 2023-06-26 NOTE — [facility] Suicide Risk Assessment (Signed)
 [facility] INPATIENT:  Family/Significant Other Suicide Prevention Education  Suicide Prevention Education:  Education Completed; Abe Hodgkins (brother-in-law) [phone], has been identified by the patient as the family member/significant other with whom the patient will be residing, and identified as the person(s) who will aid the patient in the event of a mental health crisis (suicidal ideations/suicide attempt).  With written consent from the patient, the family member/significant other has been provided the following suicide prevention education, prior to the and/or following the discharge of the patient.  The suicide prevention education provided includes the following: Suicide risk factors Suicide prevention and interventions National Suicide Hotline telephone number [facility] assessment telephone number [facility] Emergency Assistance 911 [facility] and/or Residential Mobile Crisis Unit telephone number  Request made of family/significant other to: Remove weapons (e.g., guns, rifles, knives), all items previously/currently identified as safety concern.   Remove drugs/medications (over-the-counter, prescriptions, illicit drugs), all items previously/currently identified as a safety concern.  The family member/significant other verbalizes understanding of the suicide prevention education information provided.  The family member/significant other agrees to remove the items of safety concern listed above.  Brother in law expressed no safety concerns.    Roselle Conner 06/26/2023, 9:37 AM

## 2023-06-26 NOTE — [facility] Suicide Risk Assessment (Signed)
 Suicide Risk Assessment  Discharge Assessment    [facility] Discharge Suicide Risk Assessment   Principal Problem: MDD (major depressive disorder), recurrent severe, without psychosis (HCC)  Discharge Diagnoses: Principal Problem:   MDD (major depressive disorder), recurrent severe, without psychosis (HCC) Active Problems:   Acquired hypothyroidism   Alcohol use disorder, severe, dependence (HCC)   HLD (hyperlipidemia)   GAD (generalized anxiety disorder)   Asthma   Tobacco use disorder  Total Time spent with patient: Greater than 30 minutes.  Musculoskeletal: Strength & Muscle Tone: within normal limits Gait & Station: normal Patient leans: N/A  Psychiatric Specialty Exam  Presentation  General Appearance:  Appropriate for Environment; Casual; Fairly Groomed  Eye Contact: Good  Speech: Clear and Coherent; Normal Rate  Speech Volume: Normal  Handedness: Right   Mood and Affect  Mood: Euthymic  Duration of Depression Symptoms: Greater than two weeks  Affect: Appropriate; Congruent   Thought Process  Thought Processes: Coherent; Goal Directed; Linear  Descriptions of Associations:Intact  Orientation:Full (Time, Place and Person)  Thought Content:Logical  History of Schizophrenia/Schizoaffective disorder:No  Duration of Psychotic Symptoms: NA  Hallucinations:Hallucinations: None  Ideas of Reference:None  Suicidal Thoughts:Suicidal Thoughts: No  Homicidal Thoughts:Homicidal Thoughts: No   Sensorium  Memory: Immediate Good; Recent Good; Remote Good  Judgment: Good  Insight: Fair   Art therapist  Concentration: Good  Attention Span: Good  Recall: Good  Fund of Knowledge: Fair  Language: Good   Psychomotor Activity  Psychomotor Activity:Psychomotor Activity: Normal   Assets  Assets: Communication Skills; Desire for Improvement; Financial Resources/Insurance; Housing; Resilience; Social Support   Sleep   Sleep:Sleep: Good  Estimated Sleeping Duration (Last 24 Hours): 6.50-7.75 hours  Physical Exam/Ros:  Blood pressure 98/69, pulse 82, temperature 98.1 F (36.7 C), temperature source Oral, resp. rate 16, height 5' 7 (1.702 m), weight 106.7 kg, SpO2 97%. Body mass index is 36.84 kg/m.  Mental Status Per Nursing Assessment::   On Admission:  NA  Demographic Factors:  Male, Adolescent or young adult, and Caucasian  Loss Factors: NA  Historical Factors: Impulsivity  Risk Reduction Factors:   Responsible for children under 59 years of age, Sense of responsibility to family, Living with another person, especially a relative, Positive social support, Positive therapeutic relationship, and Positive coping skills or problem solving skills  Continued Clinical Symptoms:  Alcohol/Substance Abuse/Dependencies More than one psychiatric diagnosis Previous Psychiatric Diagnoses and Treatments Medical Diagnoses and Treatments/Surgeries  Cognitive Features That Contribute To Risk:  Polarized thinking    Suicide Risk:  Minimal: No identifiable suicidal ideation.  Patients presenting with no risk factors but with morbid ruminations; may be classified as minimal risk based on the severity of the depressive symptoms   Follow-up Information     [place] Go on 06/28/2023.   Specialty: Professional Counselor Why: Please go on 06/28/23 at 9:00 am to re-establish care for therapy services.  This is not an appointment, it is a suggested time.  New and inactive patients must go between 9 am and 1 pm, Monday through Friday. Contact information: [address]  [address] [place] [address] [phone]         [facility]. Go on 07/05/2023.   Specialty: Behavioral Health Why: Please go to this provider on 07/05/23 at 7:00 am for an assessment, to obtain medication management services.  You may also go Monday through Friday, arrive by 7:00  am. Contact information: [address]   [address] [phone]  Department, [facility] .   Contact information: [address] [place] [address] [phone]         [facility] Follow up.   Why: Referral made Contact information: [address] [place] [address] [phone]                Plan Of Care/Follow-up recommendations:  See the discharge recommendations above  Asuncion Layer, NP, pmhnp, fnp-bc. 06/26/2023, 11:53 AM

## 2023-06-26 NOTE — Group Note (Signed)
 Date:  06/26/2023 Time:  8:59 AM  Group Topic/Focus:  Goals Group:   The focus of this group is to help patients establish daily goals to achieve during treatment and discuss how the patient can incorporate goal setting into their daily lives to aide in recovery. Orientation:   The focus of this group is to educate the patient on the purpose and policies of crisis stabilization and provide a format to answer questions about their admission.  The group details unit policies and expectations of patients while admitted.    Participation Level:  Active  Participation Quality:  Appropriate  Affect:  Appropriate  Cognitive:  Appropriate  Insight: Good  Engagement in Group:  Engaged  Modes of Intervention:  Orientation  Additional Comments:  Goal is to discharge  Danny West 06/26/2023, 8:59 AM

## 2023-06-26 NOTE — Progress Notes (Signed)
   06/26/23 0530  15 Minute Checks  Location Bedroom  Visual Appearance Calm  Behavior Sleeping  Sleep (Behavior[facility] Patients Only)  Calculate sleep? (Click Yes once per 24 hr at 0600 safety check) Yes  OTHER  Documented sleep last 24 hours 8

## 2023-11-09 IMAGING — DX DG CHEST 1V PORT
1 series · 1 of 1 positions shown · non-contrast
Comparison: 10/02/2020

CLINICAL DATA: Chest pain.

EXAM:
PORTABLE CHEST 1 VIEW

[chest ap]
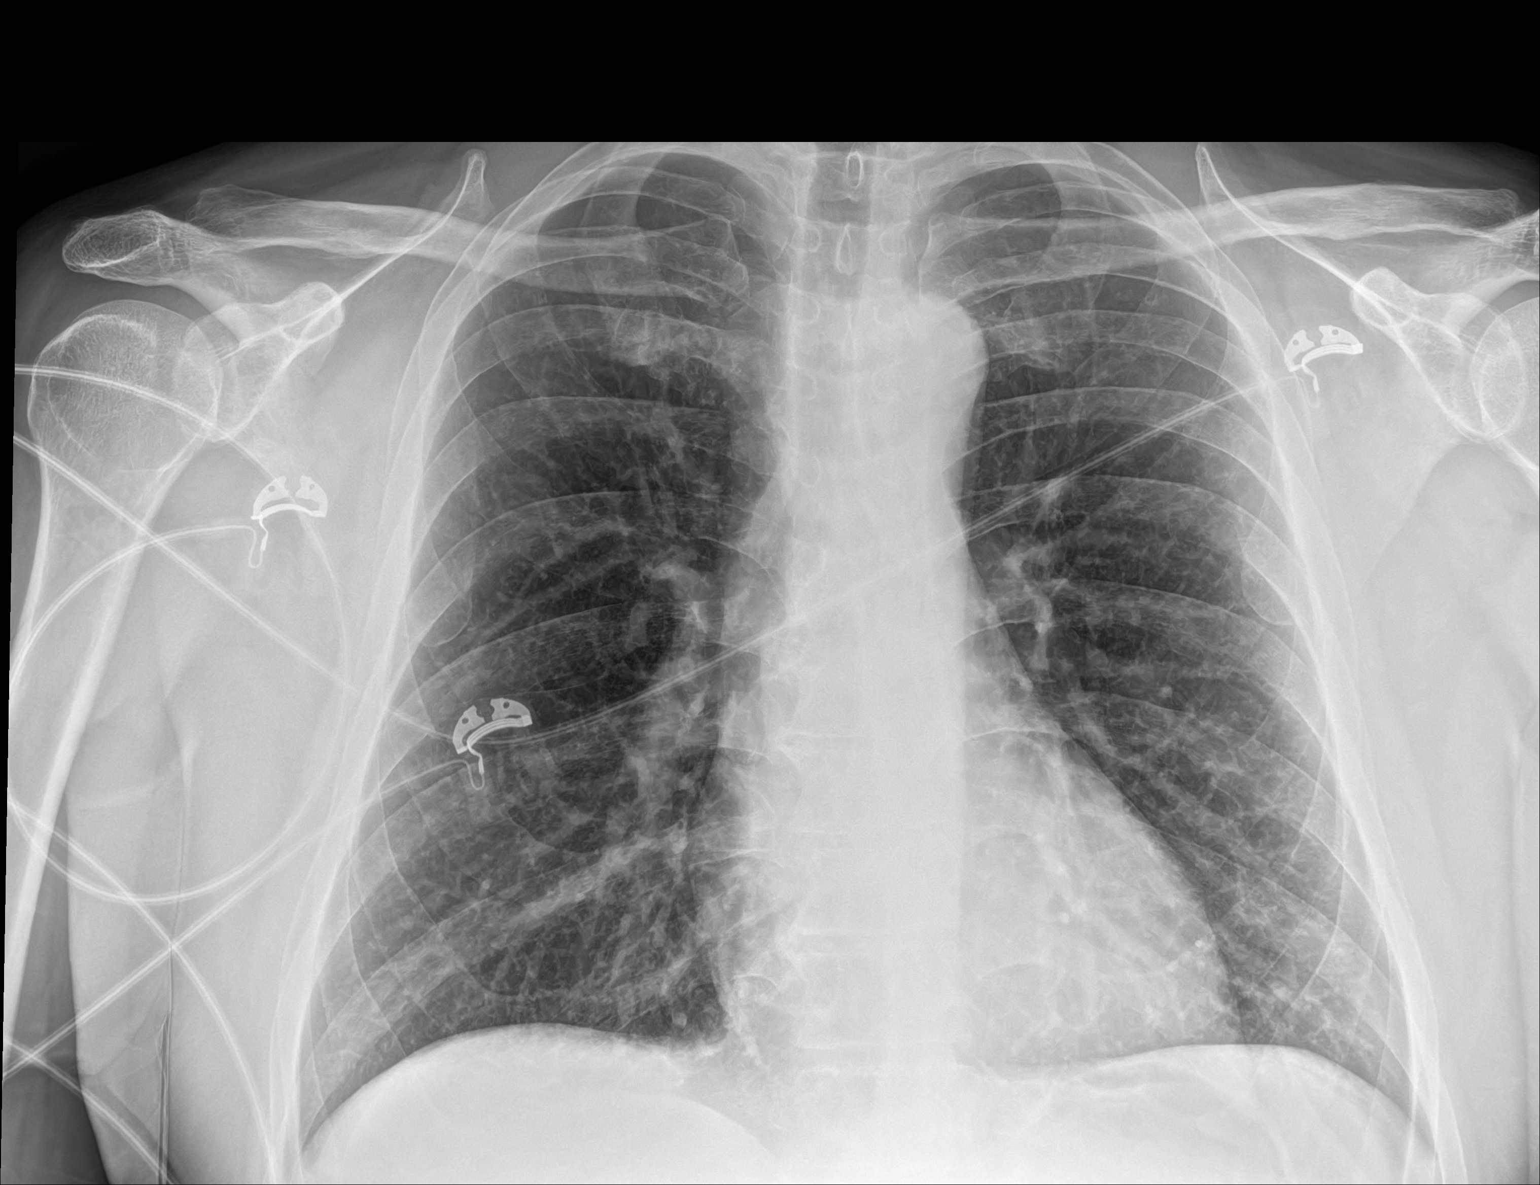

[1 of 1 positions shown; findings below may reference images not displayed]

FINDINGS: The cardiomediastinal contours are normal. Mild chronic
hyperinflation. Chronic interstitial coarsening. Pulmonary
vasculature is normal. No consolidation, pleural effusion, or
pneumothorax. No acute osseous abnormalities are seen.
IMPRESSION: No acute chest findings. Chronic mild hyperinflation and
interstitial coarsening.
